# Patient Record
Sex: Male | Born: 1965
Health system: Southern US, Community
[De-identification: ages and names within clinical notes are randomized; demographics above are authoritative.]

## PROBLEM LIST (undated history)

## (undated) DIAGNOSIS — Q631 Lobulated, fused and horseshoe kidney: Secondary | ICD-10-CM

## (undated) DIAGNOSIS — M1990 Unspecified osteoarthritis, unspecified site: Secondary | ICD-10-CM

## (undated) DIAGNOSIS — IMO0001 Reserved for inherently not codable concepts without codable children: Secondary | ICD-10-CM

## (undated) DIAGNOSIS — N62 Hypertrophy of breast: Secondary | ICD-10-CM

## (undated) DIAGNOSIS — E669 Obesity, unspecified: Secondary | ICD-10-CM

## (undated) DIAGNOSIS — J45909 Unspecified asthma, uncomplicated: Secondary | ICD-10-CM

## (undated) DIAGNOSIS — I10 Essential (primary) hypertension: Secondary | ICD-10-CM

## (undated) DIAGNOSIS — G4730 Sleep apnea, unspecified: Secondary | ICD-10-CM

## (undated) DIAGNOSIS — Z87891 Personal history of nicotine dependence: Secondary | ICD-10-CM

## (undated) DIAGNOSIS — R7302 Impaired glucose tolerance (oral): Secondary | ICD-10-CM

## (undated) DIAGNOSIS — R948 Abnormal results of function studies of other organs and systems: Secondary | ICD-10-CM

## (undated) HISTORY — DX: Hypertrophy of breast: N62

## (undated) HISTORY — DX: Impaired glucose tolerance (oral): R73.02

## (undated) HISTORY — DX: Lobulated, fused and horseshoe kidney: Q63.1

## (undated) HISTORY — PX: COLONOSCOPY: SHX174

## (undated) HISTORY — DX: Abnormal results of function studies of other organs and systems: R94.8

## (undated) HISTORY — DX: Obesity, unspecified: E66.9

## (undated) HISTORY — PX: COLONOSCOPY W/ POLYPECTOMY: SHX1380

## (undated) HISTORY — DX: Essential (primary) hypertension: I10

## (undated) HISTORY — DX: Sleep apnea, unspecified: G47.30

## (undated) HISTORY — DX: Personal history of nicotine dependence: Z87.891

## (undated) HISTORY — PX: TONSILLECTOMY: SUR1361

---

## 1998-11-05 ENCOUNTER — Encounter: Admission: RE | Admit: 1998-11-05 | Discharge: 1999-02-03 | Payer: Self-pay | Admitting: Family Medicine

## 2001-08-17 ENCOUNTER — Encounter: Payer: Self-pay | Admitting: Family Medicine

## 2001-08-17 ENCOUNTER — Encounter: Admission: RE | Admit: 2001-08-17 | Discharge: 2001-08-17 | Payer: Self-pay | Admitting: Family Medicine

## 2004-11-19 LAB — HM MAMMOGRAPHY: HM Mammogram: NEGATIVE

## 2005-06-18 ENCOUNTER — Observation Stay (HOSPITAL_COMMUNITY): Admission: EM | Admit: 2005-06-18 | Discharge: 2005-06-19 | Payer: Self-pay | Admitting: Emergency Medicine

## 2005-06-19 ENCOUNTER — Ambulatory Visit: Payer: Self-pay | Admitting: Cardiology

## 2005-06-23 ENCOUNTER — Ambulatory Visit: Payer: Self-pay

## 2006-02-20 ENCOUNTER — Ambulatory Visit: Payer: Self-pay | Admitting: Family Medicine

## 2006-07-28 ENCOUNTER — Ambulatory Visit: Payer: Self-pay | Admitting: Family Medicine

## 2007-01-27 ENCOUNTER — Ambulatory Visit: Payer: Self-pay | Admitting: Family Medicine

## 2007-03-08 ENCOUNTER — Ambulatory Visit: Payer: Self-pay | Admitting: Gastroenterology

## 2007-05-19 ENCOUNTER — Ambulatory Visit: Payer: Self-pay | Admitting: Family Medicine

## 2008-02-01 ENCOUNTER — Ambulatory Visit: Payer: Self-pay | Admitting: Family Medicine

## 2008-02-09 ENCOUNTER — Ambulatory Visit: Payer: Self-pay | Admitting: Family Medicine

## 2008-05-26 ENCOUNTER — Emergency Department: Payer: Self-pay | Admitting: Emergency Medicine

## 2008-10-20 ENCOUNTER — Ambulatory Visit: Payer: Self-pay | Admitting: Family Medicine

## 2008-10-23 ENCOUNTER — Encounter: Admission: RE | Admit: 2008-10-23 | Discharge: 2008-10-23 | Payer: Self-pay | Admitting: Family Medicine

## 2008-10-23 ENCOUNTER — Ambulatory Visit: Payer: Self-pay | Admitting: Family Medicine

## 2008-11-23 ENCOUNTER — Ambulatory Visit: Payer: Self-pay | Admitting: Family Medicine

## 2009-01-02 ENCOUNTER — Ambulatory Visit: Payer: Self-pay | Admitting: Family Medicine

## 2009-01-17 ENCOUNTER — Ambulatory Visit: Payer: Self-pay | Admitting: Family Medicine

## 2009-06-27 ENCOUNTER — Ambulatory Visit: Payer: Self-pay | Admitting: Family Medicine

## 2009-08-08 ENCOUNTER — Ambulatory Visit: Payer: Self-pay | Admitting: Family Medicine

## 2009-09-05 ENCOUNTER — Ambulatory Visit: Payer: Self-pay | Admitting: Family Medicine

## 2009-10-04 ENCOUNTER — Ambulatory Visit: Payer: Self-pay | Admitting: Family Medicine

## 2009-10-10 ENCOUNTER — Ambulatory Visit: Payer: Self-pay | Admitting: Family Medicine

## 2009-12-06 ENCOUNTER — Ambulatory Visit: Payer: Self-pay | Admitting: Physician Assistant

## 2010-01-02 ENCOUNTER — Ambulatory Visit: Payer: Self-pay | Admitting: Family Medicine

## 2010-04-25 ENCOUNTER — Ambulatory Visit: Payer: Self-pay | Admitting: Family Medicine

## 2010-04-29 ENCOUNTER — Ambulatory Visit: Payer: Self-pay | Admitting: Family Medicine

## 2010-05-01 ENCOUNTER — Ambulatory Visit: Payer: Self-pay | Admitting: Family Medicine

## 2010-05-15 ENCOUNTER — Ambulatory Visit: Payer: Self-pay | Admitting: Family Medicine

## 2010-06-03 ENCOUNTER — Ambulatory Visit: Payer: Self-pay | Admitting: Family Medicine

## 2010-11-05 ENCOUNTER — Ambulatory Visit (INDEPENDENT_AMBULATORY_CARE_PROVIDER_SITE_OTHER): Payer: BC Managed Care – PPO | Admitting: Family Medicine

## 2010-11-05 DIAGNOSIS — H6690 Otitis media, unspecified, unspecified ear: Secondary | ICD-10-CM

## 2010-11-05 DIAGNOSIS — E669 Obesity, unspecified: Secondary | ICD-10-CM

## 2010-11-05 DIAGNOSIS — J45909 Unspecified asthma, uncomplicated: Secondary | ICD-10-CM

## 2010-11-05 DIAGNOSIS — Z79899 Other long term (current) drug therapy: Secondary | ICD-10-CM

## 2010-11-26 ENCOUNTER — Encounter (INDEPENDENT_AMBULATORY_CARE_PROVIDER_SITE_OTHER): Payer: BC Managed Care – PPO | Admitting: Family Medicine

## 2010-11-26 DIAGNOSIS — J309 Allergic rhinitis, unspecified: Secondary | ICD-10-CM

## 2010-11-26 DIAGNOSIS — I10 Essential (primary) hypertension: Secondary | ICD-10-CM

## 2010-11-26 DIAGNOSIS — Z0000 Encounter for general adult medical examination without abnormal findings: Secondary | ICD-10-CM

## 2010-11-26 DIAGNOSIS — J45909 Unspecified asthma, uncomplicated: Secondary | ICD-10-CM

## 2010-11-26 DIAGNOSIS — E669 Obesity, unspecified: Secondary | ICD-10-CM

## 2011-01-24 ENCOUNTER — Telehealth: Payer: Self-pay | Admitting: Family Medicine

## 2011-01-24 ENCOUNTER — Other Ambulatory Visit: Payer: Self-pay | Admitting: Family Medicine

## 2011-01-24 MED ORDER — VALSARTAN 160 MG PO TABS: 160.0000 mg | ORAL_TABLET | Freq: Every day | ORAL | Status: DC

## 2011-01-24 MED ORDER — CITALOPRAM HYDROBROMIDE 20 MG PO TABS: 20.0000 mg | ORAL_TABLET | Freq: Every day | ORAL | Status: DC
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
## 2011-01-24 NOTE — H&P (Signed)
NAME:  Alex Oneal, Alex Oneal NO.:  [id number]   MEDICAL RECORD NO.:  [id number]          PATIENT TYPE:  EMS   LOCATION:  MAJO                         FACILITY:  [facility]   PHYSICIAN:  Lonia Blood, M.D.       DATE OF BIRTH:  Jun 23, 1966   DATE OF ADMISSION:  06/18/2005  DATE OF DISCHARGE:                                HISTORY & PHYSICAL   PRIMARY CARE PHYSICIAN:  Dr. Sharlot Gowda.   CHIEF COMPLAINT:  Left facial numbness and chest discomfort.   HISTORY OF PRESENT ILLNESS:  Alex Oneal is a 45 year old Caucasian man with  hypertension, obesity and a 20 pack year history of tobacco abuse, who  reports that during the day today while he was driving his car he suddenly  felt numbness over the left side of his face with numbness of the left upper  extremity. He drove himself to an Urgent Care Center where he noticed that  he was really numb in the left upper extremity and in the left side of his  face. He also was getting weak. At the same time he felt that his chest was  very heavy like something was sitting on his chest. His symptoms subsided in  about 15 minutes. Currently he does not have any chest pain, does not have  any discomfort. He does not report any numbness or weakness.   CURRENT MEDICATIONS:  Hydrochlorothiazide/Triamterene, and Benicar.   ALLERGIES:  No known drug allergies.   PAST MEDICAL HISTORY:  1.  Hypertension.  2.  Obesity.  3.  Tonsillectomy.   SOCIAL HISTORY:  The patient is married and has one child. He works as an  Print production planner. He drinks a fifth of alcohol every day. He smoked a pack of  cigarettes daily for 20 days and he quit 3 months ago.   FAMILY HISTORY:  His father is alive at age 33 with hypertension and  diabetes. His mother is alive at age 58 and she has had a myocardial  infarction, coronary artery disease, hypertension. A brother who probably is  healthy.   REVIEW OF SYSTEMS:  Positive for some headache. Positive for an episode  of  diarrhea this morning. Negative for coughing, negative for frank chest pain,  negative for abdominal pain, negative for nausea and vomiting. All other  systems are negative.   PHYSICAL EXAMINATION:  VITAL SIGNS:  Temperature is 98.3 on admission. Pulse  96, respirations 24, blood pressure 137/71.  GENERAL:  He is mildly obese, in no acute distress, sitting on a stretcher.  Slightly annoyed by the fact that he has spent his whole day in the  emergency room.  HEENT:  Head is normocephalic, atraumatic. There is a slight facial  asymmetry but per the wife this has always been like that. His pupils are  equal and round, react to light and accommodation. Extraocular movements are  intact.  NECK:  Supple without jugular venous distension.  CHEST:  Clear to auscultation bilaterally without wheezes, rhonchi,  crackles.  HEART:  Regular rate and rhythm with no murmurs, rubs, or gallops.  ABDOMEN:  Soft, obese, nontender, bowel sounds are present.  EXTREMITIES:  No edema.  SKIN:  Warm and dry.  NEUROLOGIC EXAM:  Cranial nerves II-XII grossly intact, 5 out of 5 strength  in all four extremities. The patient is able to walk without any difficulty.  PSYCHIATRIC:  He has good insight, orientation, memory and affect.  LYMPH NODES:  No palpable lymphadenopathy.   At time of admission an EKG showed sinus tachycardia with occasional  premature ventricular complexes. MRI/MRA of the brain are within normal  limits, there are absolutely no changes. Hemoglobin is 15.3, white blood  cell count 11,500. First set of cardiac markers are within normal limits.  Electrolytes are sodium 139, potassium 3.9, chloride 106, BUN 120,  creatinine 1.1.   ASSESSMENT/PLAN:  1.  Transient ischemic attack. Alex Oneal symptoms have completely resolved      at this point. The plan is to admit him for 23-hour observation, start      him on aspirin, obtain MRI/MRA which was already done in the emergency      room and is  within normal limits.  We will also check a fasting lipid panel, start him on cholesterol  medication if needed. Continue antihypertensive treatment.   1.  Chest pressure. This could be an anginal equivalent. The plan is to      observe the patient on telemetry. Cycle cardiac enzymes. Repeat an EKG      in the morning to make sure there were no changes over night, and start      patient on a beta blocker. Deep venous thrombosis prophylaxis will be      done with Lovenox. GI prophylaxis with Protonix.      Lonia Blood, M.D.  Electronically Signed     SL/MEDQ  D:  06/18/2005  T:  06/18/2005  Job:  [id number]   cc:   Sharlot Gowda, M.D.  Fax: [phone]

## 2011-01-24 NOTE — Consult Note (Signed)
NAME:  Alex Oneal, Alex Oneal NO.:  [id number]   MEDICAL RECORD NO.:  [id number]          PATIENT TYPE:  INP   LOCATION:  5526                         FACILITY:  [facility]   PHYSICIAN:  Bevelyn Buckles. Champey, M.D.DATE OF BIRTH:  [birth year]   DATE OF CONSULTATION:  06/18/2005  DATE OF DISCHARGE:                                   CONSULTATION   REFERRING PHYSICIAN:  Trudi Ida. Denton Lank, M.D.   REASON FOR CONSULTATION:  Transient ischemic attack.   HISTORY OF PRESENT ILLNESS:  Alex Oneal is a 45 year old right-handed  Caucasian male, with a past medical history of hypertension, who presented  with left facial and hand numbness that started at 1:40 p.m. today. The  patient stated that he woke up this morning with a slight headache this a.m.  with the left face, primarily cheek and hand, numbness. The patient also has  felt weak all over and had heaviness in his chest. He also mentions he had a  funny taste of copper in his mouth as well. He felt slightly off balance at  this time as well as when he presented. His symptoms quickly resolved over  20 to 30 minutes and he is currently back to baseline. He denies any other  symptoms or vision changes. The patient denied swallowing problems, chewing  problems, vertigo, falls, or loss of consciousness.   PAST MEDICAL HISTORY:  Positive for hypertension.   CURRENT MEDICATIONS:  Hydrochlorothiazide.   ALLERGIES:  The patient has drug allergy to AMOXICILLIN.   FAMILY HISTORY:  Positive for heart disease, hypertension, and diabetes.   SOCIAL HISTORY:  The patient currently lives with wife and child. Quit  smoking in June of this year. He used to smoke one pack a day for the past  20 years. The patient drinks six beers a day as well. The patient currently  works as an Print production planner.   REVIEW OF SYSTEMS:  Positive for left numbness which has resolved. Chest  pressure which has resolved. Anxiety, depression. Review of systems is  otherwise  negative as per history of present illness and greater than eight  other organ systems.   PHYSICAL EXAMINATION:  VITAL SIGNS:  Temperature is 98.3, pulse is 96,  respirations 24, blood pressure 127/71.  HEENT:  Normocephalic and atraumatic. Extraocular movements intact. Pupils  are equal, round, and reactive to light.  NECK:  Supple. No carotid bruits are heard.  HEART:  Regular.  LUNGS:  Clear.  ABDOMEN:  Soft and nontender.  EXTREMITIES:  No edema with good pulses.  NEUROLOGICAL:  The patient is alert and oriented x3. Language is fluent.  Memory and knowledge within normal limits. Cranial nerves II through XII are  grossly intact. Motor examination shows 5/5 strength and normal tone in all  four extremities. No drift is noted. Sensory examination is within normal  limits with light touch throughout. Reflexes are 1 to 2+ throughout and  symmetric. Cerebellar function is within normal limits. Finger test to rapid  alternating movements and gait is unremarkable.   The patient did have a MRI in the emergency room  that showed no acute  intracranial findings with some chronic sinusitis.   LABORATORY DATA:  Sodium was 139, potassium was 3.9, chloride is 106,  bicarbonate is 25.4, BUN 10, glucose is 101. WBC is 11.5, hemoglobin is  15.3, hematocrit 44.8. Platelets are 321,000. PT is 12.4, INR is 0.9, PTT is  32.   IMPRESSION:  This is a 45 year old with transient left-sided numbness in his  face and hand. This could possibly be a transient ischemic attack with a  negative MRI. With his chest pain pressure that he was having, would still  rule out a myocardial infarction, check serial cardiac enzymes. With a  normal MRI, we can also get carotid Doppler's and 2-D echocardiogram to  complete the workup. I agree with starting the patient on daily aspirin as  he does have risk factors and a family history for vascular disease. We can  check his lipid panel and homocystine level as well to  rule out possible  other risk factors. We will follow the patient while in the hospital.           ______________________________  Bevelyn Buckles. Nash Shearer, M.D.     DRC/MEDQ  D:  06/18/2005  T:  06/19/2005  Job:  [id number]

## 2011-01-27 ENCOUNTER — Other Ambulatory Visit: Payer: Self-pay | Admitting: Family Medicine

## 2011-01-27 NOTE — Telephone Encounter (Signed)
Called pt pt said he is not with [facility] anymore but cant remember who he is with will find out and call me back

## 2011-02-10 ENCOUNTER — Telehealth: Payer: Self-pay | Admitting: Family Medicine

## 2011-02-10 MED ORDER — ALBUTEROL SULFATE HFA 108 (90 BASE) MCG/ACT IN AERS: 2.0000 | INHALATION_SPRAY | Freq: Four times a day (QID) | RESPIRATORY_TRACT | Status: DC | PRN

## 2011-02-10 NOTE — Telephone Encounter (Signed)
PT'S CELEXA WAS REFILLED IN ERROR, PT NO LONGER ON THIS.  ALSO NEEDS REFILL PROAIR TO [facility] AS ABOVE

## 2011-02-11 ENCOUNTER — Telehealth: Payer: Self-pay | Admitting: Family Medicine

## 2011-02-11 NOTE — Telephone Encounter (Signed)
Pt called stating [facility] pharmacy was too expensive, he cancelled order & req'd Rx Proair be called to [facility][phone], I called in Rx #1/0. LM

## 2011-02-24 NOTE — Telephone Encounter (Signed)
done

## 2011-02-26 ENCOUNTER — Ambulatory Visit (INDEPENDENT_AMBULATORY_CARE_PROVIDER_SITE_OTHER): Payer: BC Managed Care – PPO | Admitting: Family Medicine

## 2011-02-26 ENCOUNTER — Encounter: Payer: Self-pay | Admitting: Family Medicine

## 2011-02-26 VITALS — BP 124/86 | HR 80 | Temp 98.1°F | Ht 70.75 in | Wt 322.0 lb

## 2011-02-26 DIAGNOSIS — J029 Acute pharyngitis, unspecified: Secondary | ICD-10-CM

## 2011-02-26 NOTE — Progress Notes (Signed)
  Subjective:    Patient ID: Alex Oneal, male    DOB: [birth year], 45 y.o.   MRN: [id number]  HPI Patient presents with complaint of sore throat for 3 days, gradually worsening.  Slight dry cough (chronic x 20 years), unchanged.  Denies sneezing, congestion, sinus pain.  Some ear discomfort with swallowing.  Son has been sick with sniffling and coughing.  Denies fevers  Past Medical History  Diagnosis Date  . Hypertension   No Known Allergies Current Outpatient Prescriptions on File Prior to Visit  Medication Sig Dispense Refill  . albuterol (PROAIR HFA) 108 (90 BASE) MCG/ACT inhaler Inhale 2 puffs into the lungs every 6 (six) hours as needed for wheezing.  1 Inhaler  0  . valsartan (DIOVAN) 160 MG tablet Take 1 tablet (160 mg total) by mouth daily.  90 tablet  4    Review of Systems Denies fevers, nausea, vomiting, diarrhea.  Denies skin rash (other than mild sunburn)    Objective:   Physical Exam BP 124/86  Pulse 80  Temp(Src) 98.1 F (36.7 C) (Oral)  Ht 5' 10.75" (1.797 m)  Wt 322 lb (146.058 kg)  BMI 45.23 kg/m2 Well developed obese man in no distress.  Occasional cough or throat clearing. Not congested sounding HEENT:  PERRL, EOMI, conjunctiva are clear.  TM's and EAC's are normal, although light reflex on R is a little more diffuse than on L.  OP--erythema of bilateral anterior tonsillar pillars.  Tonsils are absent.  Mucus membranes are moist Neck:  Mild tenderness of shotty anterior cervical lymphadenopathy Heart:  Regular rate and rhythm without murmurs Lungs:  Clear bilaterally Skin:  No rash       Assessment & Plan:   1. Pharyngitis  POCT rapid strep A   Strep negative; viral.  Supportive measures reviewed

## 2011-04-07 ENCOUNTER — Encounter: Payer: Self-pay | Admitting: Family Medicine

## 2011-04-07 ENCOUNTER — Ambulatory Visit (INDEPENDENT_AMBULATORY_CARE_PROVIDER_SITE_OTHER): Payer: BC Managed Care – PPO | Admitting: Family Medicine

## 2011-04-07 ENCOUNTER — Telehealth: Payer: Self-pay | Admitting: *Deleted

## 2011-04-07 VITALS — BP 122/90 | HR 88 | Ht 71.0 in | Wt 322.0 lb

## 2011-04-07 DIAGNOSIS — H6691 Otitis media, unspecified, right ear: Secondary | ICD-10-CM

## 2011-04-07 DIAGNOSIS — H6690 Otitis media, unspecified, unspecified ear: Secondary | ICD-10-CM

## 2011-04-07 MED ORDER — AMOXICILLIN 875 MG PO TABS: 875.0000 mg | ORAL_TABLET | Freq: Two times a day (BID) | ORAL | Status: DC

## 2011-04-07 NOTE — Telephone Encounter (Signed)
Explained to him that we need to see him to verify the diagnosis and make sure that we are treating him with the appropriate medicine. Amoxicillin might not be the correct drug

## 2011-04-07 NOTE — Telephone Encounter (Signed)
Called pt informed him he needed an appt to come in pt got upset but got appt

## 2011-04-07 NOTE — Patient Instructions (Addendum)
Take all the medication and if  not totally back to normal call me

## 2011-04-07 NOTE — Progress Notes (Signed)
  Subjective:    Patient ID: Alex Oneal, male    DOB: [birth year], 45 y.o.   MRN: [id number]  HPI Approximately one week ago he noticed a wet sensation in his right ear. No sore throat, congestion but slightly productive cough.   Review of Systems     Objective:   Physical Exam alert and in no distress. Tympanic membrane on the right is slightly red and bulging, left is normal' canals are normal. Throat is clear. Tonsils are normal. Neck is supple without adenopathy or thyromegaly. Cardiac exam shows a regular sinus rhythm without murmurs or gallops. Lungs are clear to auscultation.        Assessment & Plan:  ROM Amoxil. Call if not entirely better.

## 2011-06-17 DIAGNOSIS — Z0279 Encounter for issue of other medical certificate: Secondary | ICD-10-CM

## 2011-06-23 ENCOUNTER — Encounter: Payer: Self-pay | Admitting: Family Medicine

## 2011-06-25 ENCOUNTER — Ambulatory Visit (INDEPENDENT_AMBULATORY_CARE_PROVIDER_SITE_OTHER): Payer: BC Managed Care – PPO | Admitting: Family Medicine

## 2011-06-25 ENCOUNTER — Encounter: Payer: Self-pay | Admitting: Family Medicine

## 2011-06-25 VITALS — BP 120/84 | HR 78 | Wt 333.0 lb

## 2011-06-25 DIAGNOSIS — H6593 Unspecified nonsuppurative otitis media, bilateral: Secondary | ICD-10-CM

## 2011-06-25 DIAGNOSIS — H6590 Unspecified nonsuppurative otitis media, unspecified ear: Secondary | ICD-10-CM

## 2011-06-25 DIAGNOSIS — L309 Dermatitis, unspecified: Secondary | ICD-10-CM

## 2011-06-25 DIAGNOSIS — L259 Unspecified contact dermatitis, unspecified cause: Secondary | ICD-10-CM

## 2011-06-25 NOTE — Patient Instructions (Addendum)
Use a decongestant that you can use with high blood pressure. Check with your pharmacist on that. Cortisone cream twice per day sparingly for the lesion on your leg. If continued difficulty, call me.

## 2011-06-25 NOTE — Progress Notes (Signed)
  Subjective:    Patient ID: Alex Oneal, male    DOB: 1966/04/10, 45 y.o.   MRN: [id number]  HPI He is here for evaluation of a lesion present on his left leg. He has tried brace over-the-counter medicines without success. It does itch slightly. He has no lesions anywhere else. Also recently he has noted a popping sensation and congestion mainly in his left ear. No fever, chills, sore throat or coughing.   Review of Systems     Objective:   Physical Exam alert and in no distress. Tympanic membranes show fluid behind them, canals are normal. Throat is clear. Tonsils are normal. Neck is supple without adenopathy or thyromegaly. Cardiac exam shows a regular sinus rhythm without murmurs or gallops. Lungs are clear to auscultation. Sample of the left medial ankle area does have a 2 x 3 cm erythematous slightly dry area with relatively vague borders        Assessment & Plan:   1. Dermatitis   2. Bilateral serous otitis media    commend over-the-counter cortisone cream regularly for the next several weeks and call if no improvement. Also recommend decongestant but check with pharmacist concerning proper one with someone with hypertension

## 2011-09-15 ENCOUNTER — Ambulatory Visit (INDEPENDENT_AMBULATORY_CARE_PROVIDER_SITE_OTHER): Payer: BC Managed Care – PPO | Admitting: Family Medicine

## 2011-09-15 ENCOUNTER — Other Ambulatory Visit: Payer: Self-pay | Admitting: Family Medicine

## 2011-09-15 ENCOUNTER — Encounter: Payer: Self-pay | Admitting: Family Medicine

## 2011-09-15 VITALS — BP 140/80 | HR 108 | Ht 70.0 in | Wt 329.0 lb

## 2011-09-15 DIAGNOSIS — J45901 Unspecified asthma with (acute) exacerbation: Secondary | ICD-10-CM

## 2011-09-15 DIAGNOSIS — J45909 Unspecified asthma, uncomplicated: Secondary | ICD-10-CM

## 2011-09-15 LAB — COMPREHENSIVE METABOLIC PANEL
Alkaline Phosphatase: 89 U/L (ref 39–117)
BUN: 16 mg/dL (ref 6–23)
Glucose, Bld: 182 mg/dL — ABNORMAL HIGH (ref 70–99)
Sodium: 138 mEq/L (ref 135–145)
Total Bilirubin: 0.3 mg/dL (ref 0.3–1.2)

## 2011-09-15 MED ORDER — ALBUTEROL SULFATE (5 MG/ML) 0.5% IN NEBU: 2.5000 mg | INHALATION_SOLUTION | RESPIRATORY_TRACT | Status: DC

## 2011-09-15 MED ORDER — ALBUTEROL SULFATE HFA 108 (90 BASE) MCG/ACT IN AERS: 2.0000 | INHALATION_SPRAY | Freq: Four times a day (QID) | RESPIRATORY_TRACT | Status: DC | PRN

## 2011-09-15 NOTE — Patient Instructions (Signed)
I will call you after I have had a chance to look at the blood work and x-ray

## 2011-09-15 NOTE — Progress Notes (Signed)
  Subjective:    Patient ID: Alex Oneal, male    DOB: Oct 27, 1965, 46 y.o.   MRN: [id number]  HPI 4 days ago he noted the onset of tightness and wheezing no fever, chills, cough, congestion or sore throat. No chest pain, DOE, diaphoresis. He does have a history of exercise-induced asthma and has been using the albuterol regularly for the last 4   Review of Systems     Objective:   Physical Exam alert and in no distress. Tympanic membranes and canals are normal. Throat is clear. Tonsils are normal. Neck is supple without adenopathy or thyromegaly. Cardiac exam shows a regular sinus rhythm without murmurs or gallops. Lungs show diffuse wheezing. He was given 2 nebulizer treatments and improved greatly. EKG shows no acute changes        Assessment & Plan:   1. Asthma attack  PR ELECTROCARDIOGRAM, COMPLETE, DG Chest 2 View, albuterol (PROVENTIL) (5 MG/ML) 0.5% nebulizer solution 2.5 mg, CBC with Differential, Comprehensive metabolic panel   I will renew his ProAir inhaler. Also a sample of Advair 100/50 was given. Followup pending x-ray and blood work.

## 2011-09-16 ENCOUNTER — Ambulatory Visit
Admission: RE | Admit: 2011-09-16 | Discharge: 2011-09-16 | Disposition: A | Discharge status: Home / Self Care | Payer: BC Managed Care – PPO | Source: Ambulatory Visit | Attending: Family Medicine | Admitting: Family Medicine

## 2011-09-16 DIAGNOSIS — J45901 Unspecified asthma with (acute) exacerbation: Secondary | ICD-10-CM

## 2011-09-16 LAB — CBC WITH DIFFERENTIAL/PLATELET
Basophils Absolute: 0 10*3/uL (ref 0.0–0.1)
Basophils Relative: 0 % (ref 0–1)
Eosinophils Absolute: 0.4 10*3/uL (ref 0.0–0.7)
Hemoglobin: 15.2 g/dL (ref 13.0–17.0)
MCH: 30.8 pg (ref 26.0–34.0)
MCHC: 33.9 g/dL (ref 30.0–36.0)
Monocytes Absolute: 1.1 10*3/uL — ABNORMAL HIGH (ref 0.1–1.0)
Monocytes Relative: 9 % (ref 3–12)
Neutrophils Relative %: 58 % (ref 43–77)
RDW: 13.4 % (ref 11.5–15.5)

## 2011-09-16 LAB — HEMOGLOBIN A1C
Hgb A1c MFr Bld: 6.3 % — ABNORMAL HIGH (ref <5.7)
Mean Plasma Glucose: 134 mg/dL — ABNORMAL HIGH (ref <117)

## 2011-09-16 MED ORDER — ALBUTEROL SULFATE (5 MG/ML) 0.5% IN NEBU: 2.5000 mg | INHALATION_SOLUTION | RESPIRATORY_TRACT | Status: DC

## 2011-09-16 NOTE — Progress Notes (Signed)
Addended by: Ronnald Nian on: 09/16/2011 12:20 PM   Modules accepted: Orders

## 2011-09-16 NOTE — Progress Notes (Signed)
Addended by: Shaune Spittle D on: 09/16/2011 12:40 PM   Modules accepted: Orders

## 2011-09-19 ENCOUNTER — Encounter: Payer: Self-pay | Admitting: Family Medicine

## 2011-09-19 ENCOUNTER — Ambulatory Visit (INDEPENDENT_AMBULATORY_CARE_PROVIDER_SITE_OTHER): Payer: BC Managed Care – PPO | Admitting: Family Medicine

## 2011-09-19 DIAGNOSIS — R7302 Impaired glucose tolerance (oral): Secondary | ICD-10-CM | POA: Insufficient documentation

## 2011-09-19 DIAGNOSIS — E669 Obesity, unspecified: Secondary | ICD-10-CM | POA: Insufficient documentation

## 2011-09-19 DIAGNOSIS — I10 Essential (primary) hypertension: Secondary | ICD-10-CM | POA: Insufficient documentation

## 2011-09-19 DIAGNOSIS — J45909 Unspecified asthma, uncomplicated: Secondary | ICD-10-CM

## 2011-09-19 NOTE — Progress Notes (Signed)
  Subjective:    Patient ID: Alex Oneal, male    DOB: [birth year], 46 y.o.   MRN: [id number]  HPI He continues to have difficulty with wheezing especially with increased physical activity but no chest pain, diaphoresis. Review of his record indicates a normal EKG as well as chest x-ray. He has good results with Advair and occasional use of Proventil.   Review of Systems     Objective:   Physical Exam Alert and in no distress. Forced expiration does show diffuse wheezing.      Assessment & Plan:   1. Asthma  Ambulatory referral to Pulmonology   I cannot identify a true cause for why he would now be having difficulty with asthma and will therefore refer to pulmonary.

## 2011-09-19 NOTE — Patient Instructions (Addendum)
Stop the Advair and continue on Proventil until you see the lung specialist. Back here in several months we can discuss the elevated blood sugar

## 2011-09-23 ENCOUNTER — Encounter: Payer: Self-pay | Admitting: Pulmonary Disease

## 2011-09-23 ENCOUNTER — Ambulatory Visit (INDEPENDENT_AMBULATORY_CARE_PROVIDER_SITE_OTHER): Payer: BC Managed Care – PPO | Admitting: Pulmonary Disease

## 2011-09-23 VITALS — BP 120/88 | HR 97 | Temp 98.4°F | Ht 70.0 in | Wt 337.4 lb

## 2011-09-23 DIAGNOSIS — J45909 Unspecified asthma, uncomplicated: Secondary | ICD-10-CM

## 2011-09-23 DIAGNOSIS — R0989 Other specified symptoms and signs involving the circulatory and respiratory systems: Secondary | ICD-10-CM

## 2011-09-23 DIAGNOSIS — J4530 Mild persistent asthma, uncomplicated: Secondary | ICD-10-CM | POA: Insufficient documentation

## 2011-09-23 DIAGNOSIS — R0600 Dyspnea, unspecified: Secondary | ICD-10-CM

## 2011-09-23 MED ORDER — OMEPRAZOLE 40 MG PO CPDR: 40.0000 mg | DELAYED_RELEASE_CAPSULE | Freq: Every day | ORAL | Status: DC

## 2011-09-23 MED ORDER — PREDNISONE 10 MG PO TABS: ORAL_TABLET | ORAL | Status: DC

## 2011-09-23 NOTE — Assessment & Plan Note (Signed)
The patient has mild air flow obstruction by spirometry today, and based on his history I suspect this is due to asthma.  I cannot exclude a component of emphysema given his 2 pack per day smoking history, and I'm also considering the diagnosis of reactive airways disease possibly from laryngopharyngeal reflux ( he has an upper airway cough with pseudo wheezing that is suggestive of this).  At this point, I would like to start him on an aggressive bronchodilator regimen, and we'll also treat with a course of prednisone since he is having more acute symptoms.  Would also like to treat with a proton pump inhibitor in for reevaluation.

## 2011-09-23 NOTE — Patient Instructions (Signed)
Will start on dulera 100/5 2 inhalations am and pm everyday no matter what. Can use albuterol for rescue only Prednisone taper over 6 days Start omeprazole 40mg  one a day for now.  Will re-evaluate next visit followup with me in 4 weeks.

## 2011-09-23 NOTE — Progress Notes (Signed)
  Subjective:    Patient ID: Alex Oneal, male    DOB: [birth year], 46 y.o.   MRN: [id number]  HPI The patient is a 46 year old male who I've been asked to see for persistent pulmonary issues.  He has noticed for the last 3 years that every winter he notices worsening shortness of breath around cold air when he does exertional activities.  This typically will get better with as needed albuterol.  He does very well with his breathing the other months of the year.  He has noticed a significant worsening of his breathing for the last 2 weeks, along with audible wheeze.  He has been tried on Advair for approximately one week, and feels it did not help him.  He has had a chest x-ray that is unremarkable.  The patient has no history of childhood asthma.  He does have ongoing ear issues, but denies sinus pressure or drainage on a chronic basis.  He admits to intermittent reflux symptoms.  He states that he has had a cough to some degree for "30 years".  This is mostly nonproductive.  The patient also states that he has dyspnea on exertion, and at the same time does not get short of breath at the mall walking on flat ground for at least 30 minutes at a time.  He does not get winded bringing groceries in from the car, and is unsure if he will get winded walking up one flight of stairs.   Review of Systems  Constitutional: Negative for fever and unexpected weight change.  HENT: Negative for ear pain, nosebleeds, congestion, sore throat, rhinorrhea, sneezing, trouble swallowing, dental problem, postnasal drip and sinus pressure.   Eyes: Negative for redness and itching.  Respiratory: Positive for cough and shortness of breath. Negative for chest tightness and wheezing.   Cardiovascular: Positive for leg swelling. Negative for palpitations.  Gastrointestinal: Negative for nausea and vomiting.  Genitourinary: Negative for dysuria.  Musculoskeletal: Negative for joint swelling.  Skin: Negative for rash.    Neurological: Negative for headaches.  Hematological: Does not bruise/bleed easily.  Psychiatric/Behavioral: Negative for dysphoric mood. The patient is not nervous/anxious.        Objective:   Physical Exam Constitutional:  Obese male, no acute distress  HENT:  Nares patent without discharge, large turbinates.  Oropharynx without exudate, palate and uvula are thickened and very large  Eyes:  Perrla, eomi, no scleral icterus  Neck:  No JVD, no TMG  Cardiovascular:  Normal rate, regular rhythm, no rubs or gallops.  No murmurs        Intact distal pulses  Pulmonary :  Normal breath sounds, no stridor or respiratory distress   No rales, rhonchi, or wheezing.  He does have loud upper pseudowheezing with transmission to bases.  Abdominal:  Soft, nondistended, bowel sounds present.  No tenderness noted.   Musculoskeletal:  mild lower extremity edema noted.  Lymph Nodes:  No cervical lymphadenopathy noted  Skin:  No cyanosis noted  Neurologic:  Alert, appropriate, moves all 4 extremities without obvious deficit.         Assessment & Plan:

## 2011-10-21 ENCOUNTER — Ambulatory Visit: Payer: BC Managed Care – PPO | Admitting: Pulmonary Disease

## 2011-10-22 ENCOUNTER — Ambulatory Visit (INDEPENDENT_AMBULATORY_CARE_PROVIDER_SITE_OTHER): Payer: BC Managed Care – PPO | Admitting: Pulmonary Disease

## 2011-10-22 ENCOUNTER — Encounter: Payer: Self-pay | Admitting: Pulmonary Disease

## 2011-10-22 VITALS — BP 124/82 | HR 84 | Temp 98.4°F | Ht 70.0 in | Wt 346.0 lb

## 2011-10-22 DIAGNOSIS — J45909 Unspecified asthma, uncomplicated: Secondary | ICD-10-CM

## 2011-10-22 MED ORDER — OMEPRAZOLE 40 MG PO CPDR: 40.0000 mg | DELAYED_RELEASE_CAPSULE | Freq: Every day | ORAL | Status: DC

## 2011-10-22 NOTE — Assessment & Plan Note (Signed)
The patient has been on dulera since the last visit, but is unsure if this has made any difference to his breathing.  It really has been a short period of time, and I would give him longer on this medication to reduce airway inflammation.  His cough resolved since the last visit, and I suspect the proton pump inhibitor is responsible.  However, the dulera may also have helped.  The description of his acute episodes suggest probable bronchospasm, however I would also keep in mind cardiac disease.  I've asked him to stay on dulera for the next 4 weeks and to give me feedback.  If he continues to have these episodes despite staying on the medication for a prolonged period of time, I would consider doing a noninvasive stress test.

## 2011-10-22 NOTE — Patient Instructions (Signed)
Stay on omeprazole daily, and we will send in a script for 3 mos at a time. Stay on dulera for now as you are doing.  Please call in 4 weeks with how things are going. If you have an episode of shortness of breath with chest tightness and doesn't resolve with you rescue inhaler in a reasonable time, call EMS Schedule a followup with me in 6mos if doing well.  Will make a decision about your inhaler after I speak with you in 4 weeks.

## 2011-10-22 NOTE — Progress Notes (Signed)
  Subjective:    Patient ID: Alex Oneal, male    DOB: 07-Nov-1965, 46 y.o.   MRN: [id number]  HPI The patient comes in today for followup of his cough and possible asthma.  At the last visit, his cough was felt to be more upper airway in origin, and he was started on a trial of proton pump inhibitor.  He is seeing total resolution of his cough since being on this.  However, he also was started on dulera for airflow obstruction, but despite this has had 2-3 episodes of worsening shortness of breath that required a rescue inhaler.  Each episode was very heavy exertional activity, and resulted in severe shortness of breath with chest tightness.  One episode had the symptom of diaphoresis as well.  He used his rescue inhaler multiple times for each of these episodes rather than giving time to go by in between uses.  Each episode resolved within 10-30 minutes.  Patient had a cardiac evaluation 4 years ago, but none since.  He is unclear if the dulera has helped him on a day-to-day basis.   Review of Systems  Constitutional: Negative for fever and unexpected weight change.  HENT: Positive for congestion. Negative for ear pain, nosebleeds, sore throat, rhinorrhea, sneezing, trouble swallowing, dental problem, postnasal drip and sinus pressure.   Eyes: Negative for redness and itching.  Respiratory: Positive for chest tightness, shortness of breath and wheezing. Negative for cough.   Cardiovascular: Negative for palpitations and leg swelling.  Gastrointestinal: Negative for nausea and vomiting.  Genitourinary: Negative for dysuria.  Musculoskeletal: Negative for joint swelling.  Skin: Negative for rash.  Neurological: Negative for headaches.  Hematological: Does not bruise/bleed easily.  Psychiatric/Behavioral: Negative for dysphoric mood. The patient is not nervous/anxious.        Objective:   Physical Exam Obese male in no acute distress Nose without purulence or discharge noted Chest with  clear breath sounds throughout, no wheezing Cardiac exam with regular rate and rhythm Lower extremities without significant edema, no cyanosis Alert and oriented, moves all 4 extremities.       Assessment & Plan:

## 2011-11-10 ENCOUNTER — Ambulatory Visit (INDEPENDENT_AMBULATORY_CARE_PROVIDER_SITE_OTHER): Payer: BC Managed Care – PPO | Admitting: Family Medicine

## 2011-11-10 ENCOUNTER — Encounter: Payer: Self-pay | Admitting: Family Medicine

## 2011-11-10 VITALS — BP 132/108 | HR 80 | Temp 98.5°F | Ht 70.0 in | Wt 332.0 lb

## 2011-11-10 DIAGNOSIS — R142 Eructation: Secondary | ICD-10-CM

## 2011-11-10 DIAGNOSIS — K219 Gastro-esophageal reflux disease without esophagitis: Secondary | ICD-10-CM

## 2011-11-10 DIAGNOSIS — R141 Gas pain: Secondary | ICD-10-CM

## 2011-11-10 DIAGNOSIS — R143 Flatulence: Secondary | ICD-10-CM

## 2011-11-10 DIAGNOSIS — R197 Diarrhea, unspecified: Secondary | ICD-10-CM

## 2011-11-10 DIAGNOSIS — R1110 Vomiting, unspecified: Secondary | ICD-10-CM

## 2011-11-10 NOTE — Patient Instructions (Signed)
You were given 10 days of Dexilant samples.  Take this once daily in place of your omeprazole.  You go back to taking the omeprazole once you finish these samples.  Today just eat clear fluids (water, ginger ale, chicken broth).  Once you are feeling a little better, advance to [employer] (banana, rice, apple sauce and toast), and then gradually advance diet as tolerated.  Stay away from dairy for at least 5-7 days, and stay away from spicy foods, acidic foods (ie tomato-based or citrus fruits) and fried/greasy foods. You may use imodium as needed for diarrhea.  If nausea is worsening, you can call for an anti-emetic medication (medication to help with nausea).  Right now, I think the nausea is related somewhat to reflux, and the dexilant may help.  Diet for Diarrhea, Adult Having frequent, runny stools (diarrhea) has many causes. Diarrhea may be caused or worsened by food or drink. Diarrhea may be relieved by changing your diet. IF YOU ARE NOT TOLERATING SOLID FOODS:  Drink enough water and fluids to keep your urine clear or pale yellow.   Avoid sugary drinks and sodas as well as milk-based beverages.   Avoid beverages containing caffeine and alcohol.   You may try rehydrating beverages. You can make your own by following this recipe:    tsp table salt.    tsp baking soda.   ? tsp salt substitute (potassium chloride).   1 tbs + 1 tsp sugar.   1 qt water.  As your stools become more solid, you can start eating solid foods. Add foods one at a time. If a certain food causes your diarrhea to get worse, avoid that food and try other foods. A low fiber, low-fat, and lactose-free diet is recommended. Small, frequent meals may be better tolerated.  Starches  Allowed:  White, [place], and pita breads, plain rolls, buns, bagels. Plain muffins, matzo. Soda, saltine, or graham crackers. Pretzels, melba toast, zwieback. Cooked cereals made with water: cornmeal, farina, cream cereals. Dry cereals:  refined corn, wheat, rice. Potatoes prepared any way without skins, refined macaroni, spaghetti, noodles, refined rice.   Avoid:  Bread, rolls, or crackers made with whole wheat, multi-grains, rye, bran seeds, nuts, or coconut. Corn tortillas or taco shells. Cereals containing whole grains, multi-grains, bran, coconut, nuts, or raisins. Cooked or dry oatmeal. Coarse wheat cereals, granola. Cereals advertised as "high-fiber." Potato skins. Whole grain pasta, wild or brown rice. Popcorn. Sweet potatoes/yams. Sweet rolls, doughnuts, waffles, pancakes, sweet breads.  Vegetables  Allowed: Strained tomato and vegetable juices. Most well-cooked and canned vegetables without seeds. Fresh: Tender lettuce, cucumber without the skin, cabbage, spinach, bean sprouts.   Avoid: Fresh, cooked, or canned: Artichokes, baked beans, beet greens, broccoli, Brussels sprouts, corn, kale, legumes, peas, sweet potatoes. Cooked: Green or red cabbage, spinach. Avoid large servings of any vegetables, because vegetables shrink when cooked, and they contain more fiber per serving than fresh vegetables.  Fruit  Allowed: All fruit juices except prune juice. Cooked or canned: Apricots, applesauce, cantaloupe, cherries, fruit cocktail, grapefruit, grapes, kiwi, mandarin oranges, peaches, pears, plums, watermelon. Fresh: Apples without skin, ripe banana, grapes, cantaloupe, cherries, grapefruit, peaches, oranges, plums. Keep servings limited to  cup or 1 piece.   Avoid: Fresh: Apple with skin, apricots, mango, pears, raspberries, strawberries. Prune juice, stewed or dried prunes. Dried fruits, raisins, dates. Large servings of all fresh fruits.  Meat and Meat Substitutes  Allowed: Ground or well-cooked tender beef, ham, veal, lamb, pork, or poultry. Eggs, plain  cheese. Fish, oysters, shrimp, lobster, other seafoods. Liver, organ meats.   Avoid: Tough, fibrous meats with gristle. Peanut butter, smooth or chunky. Cheese, nuts, seeds,  legumes, dried peas, beans, lentils.  Milk  Allowed: Yogurt, lactose-free milk, kefir, drinkable yogurt, buttermilk, soy milk.   Avoid: Milk, chocolate milk, beverages made with milk, such as milk shakes.  Soups  Allowed: Bouillon, broth, or soups made from allowed foods. Any strained soup.   Avoid: Soups made from vegetables that are not allowed, cream or milk-based soups.  Desserts and Sweets  Allowed: Sugar-free gelatin, sugar-free frozen ice pops made without sugar alcohol.   Avoid: Plain cakes and cookies, pie made with allowed fruit, pudding, custard, cream pie. Gelatin, fruit, ice, sherbet, frozen ice pops. Ice cream, ice milk without nuts. Plain hard candy, honey, jelly, molasses, syrup, sugar, chocolate syrup, gumdrops, marshmallows.  Fats and Oils  Allowed: Avoid any fats and oils.   Avoid: Seeds, nuts, olives, avocados. Margarine, butter, cream, mayonnaise, salad oils, plain salad dressings made from allowed foods. Plain gravy, crisp bacon without rind.  Beverages  Allowed: Water, decaffeinated teas, oral rehydration solutions, sugar-free beverages.   Avoid: Fruit juices, caffeinated beverages (coffee, tea, soda or pop), alcohol, sports drinks, or lemon-lime soda or pop.  Condiments  Allowed: Ketchup, mustard, horseradish, vinegar, cream sauce, cheese sauce, cocoa powder. Spices in moderation: allspice, basil, bay leaves, celery powder or leaves, cinnamon, cumin powder, curry powder, ginger, mace, marjoram, onion or garlic powder, oregano, paprika, parsley flakes, ground pepper, rosemary, sage, savory, tarragon, thyme, turmeric.   Avoid: Coconut, honey.  Weight Monitoring: Weigh yourself every day. You should weigh yourself in the morning after you urinate and before you eat breakfast. Wear the same amount of clothing when you weigh yourself. Record your weight daily. Bring your recorded weights to your clinic visits. Tell your caregiver right away if you have gained 3  lb/1.4 kg or more in 1 day, 5 lb/2.3 kg in a week, or whatever amount you were told to report. SEEK IMMEDIATE MEDICAL CARE IF:   You are unable to keep fluids down.   You start to throw up (vomit) or diarrhea keeps coming back (persistent).   Abdominal pain develops, increases, or can be felt in one place (localizes).   You have an oral temperature above 102 F (38.9 C), not controlled by medicine.   Diarrhea contains blood or mucus.   You develop excessive weakness, dizziness, fainting, or extreme thirst.  MAKE SURE YOU:   Understand these instructions.   Will watch your condition.   Will get help right away if you are not doing well or get worse.  Document Released: 11/15/2003 Document Revised: 08/14/2011 Document Reviewed: 03/08/2009 [facility] Patient Information 2012 [place], [place].

## 2011-11-10 NOTE — Progress Notes (Signed)
Chief complaint: Sunday a week ago ate some "bad" spaghetti, experienced vomiting and diarrhea Monday thru Tues. Felt better Tuesday and went back to work Wednesday. Went out to eat yesterday and thinks he had overeaten, did vomit at 1 am this morning, has not again. Some diarrhea, mostly nausea and belching. Wonders if this coul d be something more than a stomach bug?  HPI: 46 yo male was sick last week with vomiting and diarrhea x 2 days, resolved, but recurrent symptoms early this morning. Yesterday for lunch had fried catfish and fried shrimp, hush puppies--had a large, fried meal.  Also had butter and sour cream on the potato. An hour later he felt bloated, like he overate, felt 'blown u'p with gas.  Felt so bad that he didn't eat anything else last night.  Got up at midnight with diarrhea x 2-3 times, followed by vomiting x 3.  Continues to have nausea and very foul smelling burps.  Hasn't taken any meds for his symptoms. Tried Gas-X last week for bloating without benefit.  Denies fevers, but feels cold. Denies hematemesis, coffee ground emesis, or blood in stool  Hasn't taken BP meds yet today  Current Outpatient Prescriptions on File Prior to Visit  Medication Sig Dispense Refill  . albuterol (PROAIR HFA) 108 (90 BASE) MCG/ACT inhaler Inhale 2 puffs into the lungs every 6 (six) hours as needed for wheezing.  1 Inhaler  0  . hydrochlorothiazide 25 MG tablet Take 25 mg by mouth daily.        . mometasone-formoterol (DULERA) 100-5 MCG/ACT AERO Inhale 2 puffs into the lungs 2 (two) times daily.      Marland Kitchen omeprazole (PRILOSEC) 40 MG capsule Take 1 capsule (40 mg total) by mouth daily.  90 capsule  0  . valsartan (DIOVAN) 160 MG tablet Take 1 tablet (160 mg total) by mouth daily.  90 tablet  4   Current Facility-Administered Medications on File Prior to Visit  Medication Dose Route Frequency Provider Last Rate Last Dose  . albuterol (PROVENTIL) (5 MG/ML) 0.5% nebulizer solution 2.5 mg  2.5 mg  Nebulization 1 day or 1 dose Carollee Herter, MD      . albuterol (PROVENTIL) (5 MG/ML) 0.5% nebulizer solution 2.5 mg  2.5 mg Nebulization 1 day or 1 dose Carollee Herter, MD       ROS:  Denies fevers, chest pain, headache, dizziness, skin rash.  See HPI  PHYSICAL EXAM: BP 132/108  Pulse 80  Temp(Src) 98.5 F (36.9 C) (Oral)  Ht 5\' 10"  (1.778 m)  Wt 332 lb (150.594 kg)  BMI 47.64 kg/m2 Obese male, belching frequently, appears nauseated, but otherwise in no distress HEENT:  PERRL, sclera anicteric.  OP normal, moist mucus membranes Heart: regular rate and rhythm without murmur Lungs: clear bilaterally Abdomen: obese, soft.  Normal bowel sounds, not hyperactive.  Tender in epigastrium, and mildly tender across lower abdomen.  No rebound tenderness or guarding Skin: no rash  ASSESSMENT/PLAN: 1. Belching   2. Vomiting and diarrhea   3. GERD (gastroesophageal reflux disease)    Stop omeprazole.  Take Dexilant once daily in its place x 10 days.  Call if ongoing nausea for anti-emetic medication. Clears today, advance to [employer], then advance to bland diet.  Avoid spicy, greasy foods.  F/u prn.  Resume omeprazole once Dexilant samples finished

## 2011-11-17 ENCOUNTER — Ambulatory Visit (INDEPENDENT_AMBULATORY_CARE_PROVIDER_SITE_OTHER): Payer: BC Managed Care – PPO | Admitting: Family Medicine

## 2011-11-17 ENCOUNTER — Encounter: Payer: Self-pay | Admitting: Family Medicine

## 2011-11-17 VITALS — BP 140/100 | HR 79 | Temp 98.3°F | Wt 319.0 lb

## 2011-11-17 DIAGNOSIS — R197 Diarrhea, unspecified: Secondary | ICD-10-CM

## 2011-11-17 DIAGNOSIS — H6691 Otitis media, unspecified, right ear: Secondary | ICD-10-CM

## 2011-11-17 DIAGNOSIS — H6690 Otitis media, unspecified, unspecified ear: Secondary | ICD-10-CM

## 2011-11-17 DIAGNOSIS — J069 Acute upper respiratory infection, unspecified: Secondary | ICD-10-CM

## 2011-11-17 MED ORDER — CIPROFLOXACIN HCL 500 MG PO TABS: 500.0000 mg | ORAL_TABLET | Freq: Two times a day (BID) | ORAL | Status: AC

## 2011-11-17 MED ORDER — CIPROFLOXACIN HCL 500 MG PO TABS: 500.0000 mg | ORAL_TABLET | Freq: Two times a day (BID) | ORAL | Status: DC

## 2011-11-17 NOTE — Progress Notes (Signed)
  Subjective:    Patient ID: Alex Oneal, male    DOB: [birth year], 46 y.o.   MRN: [id number]  HPI He is here for multiple reasons. He continues to have difficulty with diarrhea. He is seen no blood or pus in his stool. He cannot relate this to any particular foods. Had no nausea or vomiting. For the last 3 days however he has had sore throat, fatigue, nasal congestion as well as right earache.   Review of Systems     Objective:   Physical Exam alert and in no distress. Tympanic membrane on the right is slightly red and bulging, left is normal canals are normal. Throat is clear. Tonsils are normal. Neck is supple without adenopathy or thyromegaly. Cardiac exam shows a regular sinus rhythm without murmurs or gallops. Lungs are clear to auscultation. Abdominal exam shows no masses or tenderness       Assessment & Plan:   1. Right otitis media  ciprofloxacin (CIPRO) 500 MG tablet, DISCONTINUED: ciprofloxacin (CIPRO) 500 MG tablet  2. Acute URI    3. Diarrhea     I will wait to see what Cipro dose for the diarrhea before pursuing this further.

## 2011-11-17 NOTE — Patient Instructions (Signed)
Take the antibiotic and let me know what it  does for both the ear and your diarrhea. Use Afrin nasal spray at night so he can breathe through her nose. Go ahead and use Imodium for the diarrhea

## 2011-11-27 ENCOUNTER — Encounter: Payer: Self-pay | Admitting: Family Medicine

## 2011-11-27 ENCOUNTER — Ambulatory Visit (INDEPENDENT_AMBULATORY_CARE_PROVIDER_SITE_OTHER): Payer: BC Managed Care – PPO | Admitting: Family Medicine

## 2011-11-27 VITALS — BP 132/100 | HR 88 | Ht 70.0 in | Wt 318.0 lb

## 2011-11-27 DIAGNOSIS — R197 Diarrhea, unspecified: Secondary | ICD-10-CM

## 2011-11-27 NOTE — Patient Instructions (Signed)
Use Imodium for the diarrhea. You can double the dose if you need to.

## 2011-11-27 NOTE — Progress Notes (Signed)
  Subjective:    Patient ID: Alex Oneal, male    DOB: Apr 03, 1966, 46 y.o.   MRN: [id number]  HPI Since last being seen his upper respiratory symptoms have improved tremendously. Also with diarrhea initially cleared up however in the last 2 days he has had recurrence of vomiting and diarrhea. Vomiting is now diminished however he is still having loose watery stools. No fever, chills, blood or pus in his stool.   Review of Systems     Objective:   Physical Exam Alert and in no distress. Abdominal exam shows active bowel sounds without masses or tenderness.       Assessment & Plan:   1. Diarrhea    I will check for C. difficile toxin. Also recommend Imodium. Reevaluate after the C. difficile toxin report comes back.

## 2011-11-28 ENCOUNTER — Telehealth: Payer: Self-pay | Admitting: Internal Medicine

## 2011-11-28 NOTE — Telephone Encounter (Signed)
Pt states that he does not have antibotic that he has already finished it and he will proceed with the test around Monday or so and he is getting his water fixed over the weekend

## 2011-11-28 NOTE — Telephone Encounter (Signed)
Proceed with the test. The antibiotic I gave him should help with the problem that he is talking about in his water

## 2011-12-02 NOTE — Telephone Encounter (Signed)
Is doing his test tomorrow and will get his water fixed over easter

## 2011-12-25 ENCOUNTER — Telehealth: Payer: Self-pay | Admitting: Pulmonary Disease

## 2011-12-25 NOTE — Telephone Encounter (Signed)
Pt calling with an update on his breathing. He says the [facility] worked well for him during the colder weather. He has been off of this medication for 1 month and says he is doing well. He has only used his rescue inhaler once since his OV in Feb 2013. Pt would like to know if Dr. Shelle Iron wants him to go back on the [facility] or is he okay off of this medication for now. Pls advise.No Known Allergies

## 2011-12-26 NOTE — Telephone Encounter (Signed)
Called spoke with patient, advised of KC's recs.  Pt verbalized his understanding and stated that he cancelled his 6 month appt with KC.  Offered to reschedule this for him, but pt declined stating that he "cannot afford our services."  Encouraged pt to call when needed.  Will sign and forward to [facility] as FYI.

## 2011-12-26 NOTE — Telephone Encounter (Signed)
Have him stay on his acid reflux meds, and would like him to stop dulera for now If his symptoms start to come back, then we know that he really has asthma.  If they do not return, then stay off dulera and keep the upcoming 6mos apptm.

## 2011-12-30 ENCOUNTER — Other Ambulatory Visit: Payer: Self-pay

## 2011-12-30 ENCOUNTER — Telehealth: Payer: Self-pay

## 2011-12-30 ENCOUNTER — Other Ambulatory Visit: Payer: BC Managed Care – PPO

## 2011-12-30 DIAGNOSIS — R197 Diarrhea, unspecified: Secondary | ICD-10-CM

## 2011-12-30 NOTE — Telephone Encounter (Signed)
Dr.Lalone pt bought stool samples here what is it you want checked please advise

## 2011-12-31 ENCOUNTER — Other Ambulatory Visit: Payer: BC Managed Care – PPO

## 2012-02-03 ENCOUNTER — Telehealth: Payer: Self-pay | Admitting: Family Medicine

## 2012-02-03 NOTE — Telephone Encounter (Signed)
I do not see that he has an appointment for a general med check. Have him set this up and renew the medicines until he is seen

## 2012-02-04 ENCOUNTER — Other Ambulatory Visit: Payer: Self-pay

## 2012-02-04 MED ORDER — OMEPRAZOLE 40 MG PO CPDR: 40.0000 mg | DELAYED_RELEASE_CAPSULE | Freq: Every day | ORAL | Status: DC

## 2012-02-04 MED ORDER — VALSARTAN 160 MG PO TABS: 160.0000 mg | ORAL_TABLET | Freq: Every day | ORAL | Status: DC

## 2012-02-04 MED ORDER — HYDROCHLOROTHIAZIDE 25 MG PO TABS: 25.0000 mg | ORAL_TABLET | Freq: Every day | ORAL | Status: DC

## 2012-02-04 NOTE — Telephone Encounter (Signed)
Med sent in.

## 2012-02-04 NOTE — Telephone Encounter (Signed)
Sent meds in per pt request 

## 2012-02-06 ENCOUNTER — Telehealth: Payer: Self-pay | Admitting: Family Medicine

## 2012-02-06 MED ORDER — HYDROCHLOROTHIAZIDE 25 MG PO TABS: 25.0000 mg | ORAL_TABLET | Freq: Every day | ORAL | Status: DC

## 2012-02-06 MED ORDER — VALSARTAN 160 MG PO TABS: 160.0000 mg | ORAL_TABLET | Freq: Every day | ORAL | Status: DC

## 2012-02-06 MED ORDER — OMEPRAZOLE 40 MG PO CPDR: 40.0000 mg | DELAYED_RELEASE_CAPSULE | Freq: Every day | ORAL | Status: DC

## 2012-02-06 NOTE — Telephone Encounter (Signed)
Med sent to [facility]

## 2012-03-05 ENCOUNTER — Ambulatory Visit (INDEPENDENT_AMBULATORY_CARE_PROVIDER_SITE_OTHER): Payer: BC Managed Care – PPO | Admitting: Family Medicine

## 2012-03-05 ENCOUNTER — Encounter: Payer: Self-pay | Admitting: Family Medicine

## 2012-03-05 VITALS — BP 130/88 | HR 78 | Ht 70.0 in | Wt 318.0 lb

## 2012-03-05 DIAGNOSIS — R7309 Other abnormal glucose: Secondary | ICD-10-CM

## 2012-03-05 DIAGNOSIS — R7302 Impaired glucose tolerance (oral): Secondary | ICD-10-CM

## 2012-03-05 DIAGNOSIS — R0600 Dyspnea, unspecified: Secondary | ICD-10-CM

## 2012-03-05 DIAGNOSIS — I10 Essential (primary) hypertension: Secondary | ICD-10-CM

## 2012-03-05 DIAGNOSIS — Z0000 Encounter for general adult medical examination without abnormal findings: Secondary | ICD-10-CM

## 2012-03-05 DIAGNOSIS — M7710 Lateral epicondylitis, unspecified elbow: Secondary | ICD-10-CM

## 2012-03-05 LAB — LIPID PANEL
Cholesterol: 137 mg/dL (ref 0–200)
HDL: 37 mg/dL — ABNORMAL LOW (ref >39)
Total CHOL/HDL Ratio: 3.7 Ratio
Triglycerides: 56 mg/dL (ref <150)
VLDL: 11 mg/dL (ref 0–40)

## 2012-03-05 LAB — CBC WITH DIFFERENTIAL/PLATELET
Eosinophils Absolute: 0.2 10*3/uL (ref 0.0–0.7)
Eosinophils Relative: 2 % (ref 0–5)
Lymphs Abs: 2.9 10*3/uL (ref 0.7–4.0)
MCH: 30.6 pg (ref 26.0–34.0)
MCV: 88.3 fL (ref 78.0–100.0)
Monocytes Absolute: 0.7 10*3/uL (ref 0.1–1.0)
Platelets: 328 10*3/uL (ref 150–400)
RBC: 5.39 MIL/uL (ref 4.22–5.81)
RDW: 13.6 % (ref 11.5–15.5)

## 2012-03-05 LAB — COMPREHENSIVE METABOLIC PANEL
ALT: 27 U/L (ref 0–53)
BUN: 15 mg/dL (ref 6–23)
CO2: 27 mEq/L (ref 19–32)
Creat: 0.76 mg/dL (ref 0.50–1.35)
Total Bilirubin: 0.4 mg/dL (ref 0.3–1.2)

## 2012-03-05 MED ORDER — ALBUTEROL SULFATE HFA 108 (90 BASE) MCG/ACT IN AERS: 2.0000 | INHALATION_SPRAY | Freq: Four times a day (QID) | RESPIRATORY_TRACT | Status: DC | PRN

## 2012-03-05 NOTE — Patient Instructions (Addendum)
Heat for 20 minutes 3 times per day. Take 2 Aleve twice a day. Do as many things as you can with your palms open.   Is it more important than me

## 2012-03-05 NOTE — Progress Notes (Signed)
Subjective:    Patient ID: Alex Oneal, male    DOB: [birth year], 46 y.o.   MRN: [id number]  HPI He is here for complete examination. He was seen by pulmonary and placed on Dulera and Prilosec. He notes that the cough went away almost immediately when he started taking the Prilosec. He stopped taking Dulera in March. Since that time the only times he has had difficulty with his breathing is when he mows the lawn or one time he noted on a long hike he had difficulty with tightness in his chest. He also states that pollen from pine trees causes difficulty. He also complains of bilateral elbow pain started after he used a file to chop up some dirt. He experienced bilateral lateral and medial elbow pain. Also of note is he did have a contaminated well which was causing previous difficulty with diarrhea. He apparently had high levels of coliform bacteria as well as Escherichia coli however he has cleaned this up with a special filtering system.   Review of Systems  HENT: Negative.   Respiratory: Positive for cough and chest tightness.   Cardiovascular: Negative.   Gastrointestinal: Negative.   Genitourinary: Negative.   Neurological: Negative.   Hematological: Negative.        Objective:   Physical Exam BP 130/88  Pulse 78  Ht 5\' 10"  (1.778 m)  Wt 318 lb (144.244 kg)  BMI 45.63 kg/m2  SpO2 96%  General Appearance:    Alert, cooperative, no distress, appears stated age  Head:    Normocephalic, without obvious abnormality, atraumatic  Eyes:    PERRL, conjunctiva/corneas clear, EOM's intact, fundi    benign  Ears:    Normal TM's and external ear canals  Nose:   Nares normal, mucosa normal, no drainage or sinus   tenderness  Throat:   Lips, mucosa, and tongue normal; teeth and gums normal  Neck:   Supple, no lymphadenopathy;  thyroid:  no   enlargement/tenderness/nodules; no carotid   bruit or JVD  Back:    Spine nontender, no curvature, ROM normal, no CVA     tenderness  Lungs:      Clear to auscultation bilaterally without wheezes, rales or     ronchi; respirations unlabored  Chest Wall:    No tenderness or deformity   Heart:    Regular rate and rhythm, S1 and S2 normal, no murmur, rub   or gallop  Breast Exam:    No chest wall tenderness, masses or gynecomastia  Abdomen:     Soft, non-tender, nondistended, normoactive bowel sounds,    no masses, no hepatosplenomegaly  Genitalia:    Normal male external genitalia without lesions.  Testicles without masses.  No inguinal hernias.  Rectal:    Normal sphincter tone, no masses or tenderness; guaiac negative stool.  Prostate smooth, no nodules, not enlarged.  Extremities:   No clubbing, cyanosis or edema.exam of both elbows shows full motion however he does have slight tenderness palpation over both lateral epicondyles   Pulses:   2+ and symmetric all extremities  Skin:   Skin color, texture, turgor normal, no rashes or lesions  Lymph nodes:   Cervical, supraclavicular, and axillary nodes normal  Neurologic:   CNII-XII intact, normal strength, sensation and gait; reflexes 2+ and symmetric throughout          Psych:   Normal mood, affect, hygiene and grooming.           Assessment & Plan:   1. Glucose  intolerance (impaired glucose tolerance)  Amb ref to Medical Nutrition Therapy-MNT  2. Hypertension    3. Dyspnea    4. Lateral epicondylitis    5. Obesity, Class III, BMI 40-49.9 (morbid obesity)  Amb ref to Medical Nutrition Therapy-MNT  6. Routine general medical examination at a health care facility  CBC with Differential, Comprehensive metabolic panel, Lipid panel, Hemoccult - 1 Card (office)   I will continue him on his albuterol. Also instructed him on proper care for his elbows including heat, anti-inflammatory and doing as many things as possible pounds (discussed his weight reduction with him. He has made some changes and I will send him to nutritionist for further care.

## 2012-03-08 ENCOUNTER — Other Ambulatory Visit: Payer: Self-pay

## 2012-03-08 MED ORDER — VALSARTAN 160 MG PO TABS: 160.0000 mg | ORAL_TABLET | Freq: Every day | ORAL | Status: DC

## 2012-03-08 MED ORDER — OMEPRAZOLE 40 MG PO CPDR: 40.0000 mg | DELAYED_RELEASE_CAPSULE | Freq: Every day | ORAL | Status: DC

## 2012-03-08 MED ORDER — HYDROCHLOROTHIAZIDE 25 MG PO TABS: 25.0000 mg | ORAL_TABLET | Freq: Every day | ORAL | Status: DC

## 2012-03-08 NOTE — Telephone Encounter (Signed)
Med reordered for one year

## 2012-03-29 ENCOUNTER — Telehealth: Payer: Self-pay | Admitting: Internal Medicine

## 2012-03-29 NOTE — Telephone Encounter (Signed)
My note says that he is off this medication and just using albuterol. If he feels that he needs to go back on it, he needs to make an appointment

## 2012-03-29 NOTE — Telephone Encounter (Signed)
Pt states all he wanted was an appt to discuss about getting back on med not med refill some where the wires are crossed pt has appt.

## 2012-03-30 ENCOUNTER — Ambulatory Visit (INDEPENDENT_AMBULATORY_CARE_PROVIDER_SITE_OTHER): Payer: BC Managed Care – PPO | Admitting: Family Medicine

## 2012-03-30 VITALS — BP 130/82 | HR 80 | Wt 319.0 lb

## 2012-03-30 DIAGNOSIS — J45909 Unspecified asthma, uncomplicated: Secondary | ICD-10-CM

## 2012-03-30 MED ORDER — MOMETASONE FUROATE 220 MCG/INH IN AEPB: 2.0000 | INHALATION_SPRAY | Freq: Every day | RESPIRATORY_TRACT | Status: DC

## 2012-03-30 NOTE — Progress Notes (Signed)
  Subjective:    Patient ID: Alex Oneal, male    DOB: Jul 15, 1966, 46 y.o.   MRN: [id number]  HPI He is here for consult concerning difficulty with chest tightness and wheezing. Since last being seen he notes that he is now having difficulty with tightness especially at night. This is apparently occurring over 50% of the time. He would like to be placed back on his previous Dulera but does have concerns over this.  Review of Systems     Objective:   Physical Exam Alert and in no distress. Cardiac exam shows regular rhythm without murmurs or gallops. Lungs clear to auscultation.       Assessment & Plan:   1. Asthma  mometasone (ASMANEX 60 METERED DOSES) 220 MCG/INH inhaler   I will try him on a pure steroid preparation first. If he has breakthroughs, we talked about placing him back on [facility]

## 2012-04-21 ENCOUNTER — Ambulatory Visit: Payer: BC Managed Care – PPO | Admitting: Pulmonary Disease

## 2013-03-08 ENCOUNTER — Other Ambulatory Visit: Payer: Self-pay | Admitting: Family Medicine

## 2013-03-08 ENCOUNTER — Encounter: Payer: Self-pay | Admitting: Family Medicine

## 2013-03-08 ENCOUNTER — Ambulatory Visit (INDEPENDENT_AMBULATORY_CARE_PROVIDER_SITE_OTHER): Payer: BC Managed Care – PPO | Admitting: Family Medicine

## 2013-03-08 VITALS — BP 130/90 | HR 85 | Ht 71.0 in | Wt 325.0 lb

## 2013-03-08 DIAGNOSIS — R7302 Impaired glucose tolerance (oral): Secondary | ICD-10-CM

## 2013-03-08 DIAGNOSIS — J45901 Unspecified asthma with (acute) exacerbation: Secondary | ICD-10-CM

## 2013-03-08 DIAGNOSIS — Z0000 Encounter for general adult medical examination without abnormal findings: Secondary | ICD-10-CM

## 2013-03-08 DIAGNOSIS — R7309 Other abnormal glucose: Secondary | ICD-10-CM

## 2013-03-08 DIAGNOSIS — I10 Essential (primary) hypertension: Secondary | ICD-10-CM

## 2013-03-08 DIAGNOSIS — J4520 Mild intermittent asthma, uncomplicated: Secondary | ICD-10-CM

## 2013-03-08 DIAGNOSIS — B356 Tinea cruris: Secondary | ICD-10-CM

## 2013-03-08 DIAGNOSIS — E669 Obesity, unspecified: Secondary | ICD-10-CM

## 2013-03-08 DIAGNOSIS — K219 Gastro-esophageal reflux disease without esophagitis: Secondary | ICD-10-CM

## 2013-03-08 DIAGNOSIS — J45909 Unspecified asthma, uncomplicated: Secondary | ICD-10-CM

## 2013-03-08 LAB — COMPREHENSIVE METABOLIC PANEL
BUN: 16 mg/dL (ref 6–23)
CO2: 30 mEq/L (ref 19–32)
Calcium: 9.5 mg/dL (ref 8.4–10.5)
Chloride: 101 mEq/L (ref 96–112)
Creat: 0.78 mg/dL (ref 0.50–1.35)
Total Bilirubin: 0.5 mg/dL (ref 0.3–1.2)

## 2013-03-08 LAB — CBC WITH DIFFERENTIAL/PLATELET
Basophils Absolute: 0 10*3/uL (ref 0.0–0.1)
Eosinophils Absolute: 0.3 10*3/uL (ref 0.0–0.7)
Eosinophils Relative: 3 % (ref 0–5)
HCT: 46 % (ref 39.0–52.0)
Lymphocytes Relative: 23 % (ref 12–46)
Lymphs Abs: 2.8 10*3/uL (ref 0.7–4.0)
MCH: 30.4 pg (ref 26.0–34.0)
MCV: 88.5 fL (ref 78.0–100.0)
Monocytes Absolute: 1.1 10*3/uL — ABNORMAL HIGH (ref 0.1–1.0)
RDW: 13.6 % (ref 11.5–15.5)
WBC: 12.3 10*3/uL — ABNORMAL HIGH (ref 4.0–10.5)

## 2013-03-08 LAB — LIPID PANEL
Cholesterol: 136 mg/dL (ref 0–200)
HDL: 42 mg/dL (ref >39)
Total CHOL/HDL Ratio: 3.2 Ratio
Triglycerides: 54 mg/dL (ref <150)

## 2013-03-08 LAB — POCT URINALYSIS DIPSTICK
Blood, UA: NEGATIVE
Ketones, UA: NEGATIVE
Protein, UA: NEGATIVE
Spec Grav, UA: 1.01
Urobilinogen, UA: NEGATIVE

## 2013-03-08 MED ORDER — ALBUTEROL SULFATE (5 MG/ML) 0.5% IN NEBU: 2.5000 mg | INHALATION_SOLUTION | RESPIRATORY_TRACT | Status: DC

## 2013-03-08 MED ORDER — OMEPRAZOLE 40 MG PO CPDR: 40.0000 mg | DELAYED_RELEASE_CAPSULE | Freq: Every day | ORAL | Status: DC

## 2013-03-08 MED ORDER — SULCONAZOLE NITRATE 1 % EX CREA: TOPICAL_CREAM | Freq: Every day | CUTANEOUS | Status: DC

## 2013-03-08 MED ORDER — HYDROCHLOROTHIAZIDE 25 MG PO TABS: 25.0000 mg | ORAL_TABLET | Freq: Every day | ORAL | Status: DC

## 2013-03-08 MED ORDER — VALSARTAN 160 MG PO TABS: 160.0000 mg | ORAL_TABLET | Freq: Every day | ORAL | Status: DC

## 2013-03-08 NOTE — Patient Instructions (Signed)
Take the same approach that you did with smoking to your eating remember what you kid said toyou

## 2013-03-08 NOTE — Progress Notes (Signed)
Subjective:    Patient ID: Alex Oneal, male    DOB: [birth year], 47 y.o.   MRN: [id number]  HPI He is here for complete examination. He continues on his blood pressure medication and is having no difficulty. He also is using his inhaler and doing quite well with this. He has had some difficulty with a rash present in the groin area and would like a medicine that was used in the past to help with this. His reflux is under good control with Prilosec. He notes having difficulty with his weight stating that he does eat out of habit and boredom. He did quit smoking several years ago at the test of his son   Review of Systems   negative except as above Objective:   Physical Exam BP 130/90  Pulse 85  Ht 5\' 11"  (1.803 m)  Wt 325 lb (147.419 kg)  BMI 45.35 kg/m2  General Appearance:    Alert, cooperative, no distress, appears stated age  Head:    Normocephalic, without obvious abnormality, atraumatic  Eyes:    PERRL, conjunctiva/corneas clear, EOM's intact, fundi    benign  Ears:    Normal TM's and external ear canals  Nose:   Nares normal, mucosa normal, no drainage or sinus   tenderness  Throat:   Lips, mucosa, and tongue normal; teeth and gums normal  Neck:   Supple, no lymphadenopathy;  thyroid:  no   enlargement/tenderness/nodules; no carotid   bruit or JVD  Back:    Spine nontender.  Lungs:     Clear to auscultation bilaterally without wheezes, rales or     ronchi; respirations unlabored  Chest Wall:    No tenderness or deformity   Heart:    Regular rate and rhythm, S1 and S2 normal, no murmur, rub   or gallop  Breast Exam:    No chest wall tenderness, masses or gynecomastia  Abdomen:     Soft, non-tender, nondistended, normoactive bowel sounds,    no masses, no hepatosplenomegaly  Genitalia:    Normal male external genitalia without lesions.  Testicles without masses.  No inguinal hernias.   Rectal:    Normal sphincter tone, no masses or tenderness; guaiac negative stool.   Prostate smooth, no nodules, not enlarged.  Extremities:   No clubbing, cyanosis or edema  Pulses:   2+ and symmetric all extremities  Skin:   Skin color, texture, turgor normal, no rashes or lesions  Lymph nodes:   Cervical, supraclavicular, and axillary nodes normal  Neurologic:   CNII-XII intact, normal strength, sensation and gait; reflexes 2+ and symmetric throughout          Psych:   Normal mood, affect, hygiene and grooming.          Assessment & Plan:  Hypertension - Plan: POCT Urinalysis Dipstick, hydrochlorothiazide (HYDRODIURIL) 25 MG tablet, valsartan (DIOVAN) 160 MG tablet  Obesity (BMI 30-39.9)  Glucose intolerance (impaired glucose tolerance)  Asthma, intrinsic, uncomplicated  Routine general medical examination at a health care facility - Plan: CBC with Differential, Comprehensive metabolic panel, Lipid panel  Tinea cruris - Plan: sulconazole (EXELDERM) 1 % cream  Asthma - Plan: albuterol (PROVENTIL) (5 MG/ML) 0.5% nebulizer solution 2.5 mg  GERD (gastroesophageal reflux disease) - Plan: omeprazole (PRILOSEC) 40 MG capsule Discussed the fact that he was able to quit smoking and he needs but that same energy into making these lifestyle changes. I will also refer him to Ed Lurey to help with psychological component of his eating  habits.ture,

## 2013-03-09 NOTE — Progress Notes (Signed)
Quick Note:  Sent pt letter on labs ______

## 2013-03-10 ENCOUNTER — Telehealth: Payer: Self-pay | Admitting: Family Medicine

## 2013-03-10 MED ORDER — NAFTIFINE HCL 2 % EX CREA: 1.0000 "application " | TOPICAL_CREAM | Freq: Every day | CUTANEOUS | Status: DC

## 2013-03-10 NOTE — Telephone Encounter (Signed)
Changed to Naftin 2%, called into pharmacy.

## 2013-03-10 NOTE — Telephone Encounter (Signed)
Tammy is working on this 

## 2013-03-16 ENCOUNTER — Telehealth: Payer: Self-pay | Admitting: Family Medicine

## 2013-03-16 NOTE — Telephone Encounter (Signed)
He needs an appointment with me

## 2013-03-17 ENCOUNTER — Ambulatory Visit (INDEPENDENT_AMBULATORY_CARE_PROVIDER_SITE_OTHER): Payer: BC Managed Care – PPO | Admitting: Family Medicine

## 2013-03-17 ENCOUNTER — Encounter: Payer: Self-pay | Admitting: Family Medicine

## 2013-03-17 VITALS — BP 130/80

## 2013-03-17 DIAGNOSIS — J45901 Unspecified asthma with (acute) exacerbation: Secondary | ICD-10-CM

## 2013-03-17 DIAGNOSIS — J4541 Moderate persistent asthma with (acute) exacerbation: Secondary | ICD-10-CM

## 2013-03-17 DIAGNOSIS — J45909 Unspecified asthma, uncomplicated: Secondary | ICD-10-CM

## 2013-03-17 DIAGNOSIS — J4520 Mild intermittent asthma, uncomplicated: Secondary | ICD-10-CM

## 2013-03-17 MED ORDER — MOMETASONE FUROATE 220 MCG/INH IN AEPB: 2.0000 | INHALATION_SPRAY | Freq: Every day | RESPIRATORY_TRACT | Status: DC

## 2013-03-17 NOTE — Progress Notes (Signed)
  Subjective:    Patient ID: Alex Oneal, male    DOB: 12/16/1965, 47 y.o.   MRN: [id number]  HPI He is here for recheck on his breathing. He stopped using his steroid inhaler about 6 months ago and was doing well with using his rescue inhaler only when he would mow the lawn however within the 2 weeks he notes that he is having use the inhaler much more frequently especially at night   Review of Systems     Objective:   Physical Exam Alert and in no distress. Exam of the lungs does show expiratory wheezing       Assessment & Plan:  Asthma, moderate persistent, with acute exacerbation - Plan: mometasone (ASMANEX 60 METERED DOSES) 220 MCG/INH inhaler  Asthma, intrinsic, uncomplicated  a sample of Asmanex was given and a prescription. Instructions were given on use the medication and use of the rescue inhaler. He is comfortable with this and will call if he has further difficulties.

## 2013-03-17 NOTE — Telephone Encounter (Signed)
PT COMING IN AT 10:30

## 2013-03-17 NOTE — Patient Instructions (Signed)
The Asmanex should help however if you have to use your other inhaler more than twice a month at night or twice a week during the day and you need to let me know

## 2013-03-28 ENCOUNTER — Ambulatory Visit (INDEPENDENT_AMBULATORY_CARE_PROVIDER_SITE_OTHER): Payer: BC Managed Care – PPO | Admitting: Family Medicine

## 2013-03-28 VITALS — BP 140/90 | HR 87

## 2013-03-28 DIAGNOSIS — L739 Follicular disorder, unspecified: Secondary | ICD-10-CM

## 2013-03-28 DIAGNOSIS — L738 Other specified follicular disorders: Secondary | ICD-10-CM

## 2013-03-28 DIAGNOSIS — B356 Tinea cruris: Secondary | ICD-10-CM

## 2013-03-28 MED ORDER — DOXYCYCLINE HYCLATE 100 MG PO TABS: 100.0000 mg | ORAL_TABLET | Freq: Two times a day (BID) | ORAL | Status: DC

## 2013-03-28 MED ORDER — TERBINAFINE HCL 250 MG PO TABS: 250.0000 mg | ORAL_TABLET | Freq: Every day | ORAL | Status: DC

## 2013-03-28 NOTE — Patient Instructions (Signed)
Use topical Lamisil as well as the oral medication. Let me know how you're doing in about 2 weeks

## 2013-03-28 NOTE — Progress Notes (Signed)
  Subjective:    Patient ID: Alex Oneal, male    DOB: [birth year], 47 y.o.   MRN: [id number]  HPI He is here for recheck on continued difficulty with a rash in the inguinal area. He has been using a topical antifungal with no real relief. He still having difficulty with itching now also notes some follicular lesions.   Review of Systems     Objective:   Physical Exam Exam of the inguinal area does show some hyperpigmentation on the inner aspect of the thigh and slight dryness. Distal to this there is some follicular lesions and slight erythema.      Assessment & Plan:  Tinea cruris - Plan: terbinafine (LAMISIL) 250 MG tablet  Folliculitis - Plan: doxycycline (VIBRA-TABS) 100 MG tablet, Culture, routine-abscess, CANCELED: Aerobic Culture  Use topical Lamisil as well as the oral medication. Let me know how you're doing in about 2 weeks

## 2013-03-29 ENCOUNTER — Telehealth: Payer: Self-pay | Admitting: Internal Medicine

## 2013-03-29 NOTE — Telephone Encounter (Signed)
PT INFORMED

## 2013-03-29 NOTE — Telephone Encounter (Signed)
A short course of the antibiotic should not be a big concern

## 2013-03-29 NOTE — Telephone Encounter (Signed)
Pt states he read that you should not take doxycyline while drinking that it can cause liver damage and pt states he has drank 2 drinks a day for the last 15 years. And wanted to know if he should not be taking this med since he drinks

## 2013-04-01 LAB — CULTURE, ROUTINE-ABSCESS

## 2013-05-11 ENCOUNTER — Ambulatory Visit (INDEPENDENT_AMBULATORY_CARE_PROVIDER_SITE_OTHER): Payer: BC Managed Care – PPO | Admitting: Family Medicine

## 2013-05-11 ENCOUNTER — Encounter: Payer: Self-pay | Admitting: Family Medicine

## 2013-05-11 VITALS — BP 130/90 | HR 80 | Wt 325.0 lb

## 2013-05-11 DIAGNOSIS — L989 Disorder of the skin and subcutaneous tissue, unspecified: Secondary | ICD-10-CM

## 2013-05-11 DIAGNOSIS — Z23 Encounter for immunization: Secondary | ICD-10-CM

## 2013-05-11 NOTE — Progress Notes (Signed)
  Subjective:    Patient ID: Alex Oneal, male    DOB: 05/29/1966, 47 y.o.   MRN: [id number]  HPI He is here for evaluation of a lesion present on the left forearm near the elbow. He would also like a flu shot.   Review of Systems     Objective:   Physical Exam A slightly pigmented flat lesion approximately 1 cm in size with clear margins was noted on the lateral aspect of the left forearm.       Assessment & Plan:  Need for prophylactic vaccination and inoculation against influenza - Plan: Flu Vaccine QUAD 36+ mos PF IM (Fluarix)  Benign skin lesion of forearm  I reassured him that it was not dangerous but to watch it and if it changed in any way shape or form, to let me know. Shot given with benefits and risks discussed.

## 2013-05-11 NOTE — Patient Instructions (Signed)
If the forearm lesion changes in any way shape or form , come back and we will look again

## 2013-08-19 ENCOUNTER — Ambulatory Visit (INDEPENDENT_AMBULATORY_CARE_PROVIDER_SITE_OTHER): Payer: BC Managed Care – PPO | Admitting: Medical

## 2013-08-19 ENCOUNTER — Encounter: Payer: Self-pay | Admitting: Medical

## 2013-08-19 VITALS — BP 118/90 | HR 80 | Temp 97.4°F | Resp 16 | Wt 325.0 lb

## 2013-08-19 DIAGNOSIS — R0989 Other specified symptoms and signs involving the circulatory and respiratory systems: Secondary | ICD-10-CM

## 2013-08-19 DIAGNOSIS — I10 Essential (primary) hypertension: Secondary | ICD-10-CM

## 2013-08-19 DIAGNOSIS — E669 Obesity, unspecified: Secondary | ICD-10-CM

## 2013-08-19 DIAGNOSIS — R42 Dizziness and giddiness: Secondary | ICD-10-CM

## 2013-08-19 DIAGNOSIS — R0609 Other forms of dyspnea: Secondary | ICD-10-CM

## 2013-08-19 DIAGNOSIS — R0681 Apnea, not elsewhere classified: Secondary | ICD-10-CM

## 2013-08-19 DIAGNOSIS — R400 Somnolence: Secondary | ICD-10-CM

## 2013-08-19 DIAGNOSIS — G4710 Hypersomnia, unspecified: Secondary | ICD-10-CM

## 2013-08-19 DIAGNOSIS — R51 Headache: Secondary | ICD-10-CM

## 2013-08-19 DIAGNOSIS — R0683 Snoring: Secondary | ICD-10-CM

## 2013-08-19 NOTE — Progress Notes (Signed)
Subjective: Here for BP concern.  BP feels high.  Was in normal state of health until he awoke with this morning 4am with pounding headache.   He doesn't usually get headaches, and this was atypical for him.  Went back to bed, and by 6am the headache started easing off.  Felt odd the rest of the day.  Currently feels a little lightheaded, dizzy.  He drinks 3oz Scotch nightly as usual.  Had fajitas last night.  Doesn't check BPs on his own.  He typically has not had headaches, chest pain, palpitations, SOB.  He does get some ankle/lower leg edema by end of the day. He does report drinking double large coffees this morning which is not typical of him.  No other aggravating or relieving factors. no other c/o.    Past Medical History  Diagnosis Date  . Hypertension   . Glucose intolerance (impaired glucose tolerance)   . ETOH abuse   . Gynecomastia, male   . Obesity   . Former smoker     QUIT 02/2005    Review of Systems Constitutional: -fever, -chills, -sweats, -unexpected weight change,-fatigue ENT: -runny nose, -ear pain, -sore throat Cardiology:  -chest pain, -palpitations, -edema Respiratory: -cough, -shortness of breath, -wheezing Gastroenterology: -abdominal pain, -nausea, -vomiting, -diarrhea, -constipation Hematology: -bleeding or bruising problems Musculoskeletal: -arthralgias, -myalgias, -joint swelling, -back pain Ophthalmology: -vision changes Urology: -dysuria, -difficulty urinating, -hematuria, -urinary frequency, -urgency Neurology:  -weakness, -tingling, -numbness   Objective: Filed Vitals:   08/19/13 1426  BP: 122/80  Pulse: 82  Temp: 97.4 F (36.3 C)  Resp: 16    General appearance: alert, no distress, WD/WN, obese white male HEENT: normocephalic, sclerae anicteric, TMs pearly, nares patent, no discharge or erythema, pharynx normal Oral cavity: MMM, no lesions Neck: supple, no lymphadenopathy, no thyromegaly, no masses, no bruits or JVD Heart: RRR, normal S1, S2,  no murmurs Lungs: CTA bilaterally, no wheezes, rhonchi, or rales Abdomen: +bs, soft, non tender, non distended, no masses, no hepatomegaly, no splenomegaly Pulses: 2+ symmetric, upper and lower extremities, normal cap refill Ext: no edema, no cyanosis or clubbing Neuro: CN2-12 intact, nonfocal exam   Adult ECG Report  Indication: dizziness, HTN  Rate: 80bpm  Rhythm: normal sinus rhythm  QRS Axis: 50 degrees  PR Interval:  QRS Duration: 82ms  QTc:  Conduction Disturbances: none  Other Abnormalities: none  Patient's cardiac risk factors are: hypertension, male gender, obesity (BMI >= 30 kg/m2) and sedentary lifestyle.  EKG comparison: none  Narrative Interpretation: normal EKG     Assessment: Encounter Diagnoses  Name Primary?  . Dizziness and giddiness Yes  . Headache(784.0)   . Essential hypertension, benign   . Obesity, unspecified   . Snoring   . Daytime somnolence   . Witnessed apneic spells     Plan: Reviewed EKG, glucose 88, ortho statics normal.   Dizziness, headache, concern for BP - Discussed possible causes.  No red flags at the moment.  Advised he limit caffeine use, c/t BP medication as usual, avoid added salt, and call/recheck if not improving by Monday.  We discussed signs symptoms of MI, stroke, aneurysm that would prompt call to 911.    He wants to get sleep study after the first of the year.    Follow-up - call back Monday

## 2013-12-06 ENCOUNTER — Encounter: Payer: Self-pay | Admitting: Family Medicine

## 2013-12-06 ENCOUNTER — Ambulatory Visit (INDEPENDENT_AMBULATORY_CARE_PROVIDER_SITE_OTHER): Payer: BC Managed Care – PPO | Admitting: Family Medicine

## 2013-12-06 VITALS — BP 150/92 | HR 80 | Wt 339.0 lb

## 2013-12-06 DIAGNOSIS — R143 Flatulence: Secondary | ICD-10-CM

## 2013-12-06 DIAGNOSIS — R141 Gas pain: Secondary | ICD-10-CM

## 2013-12-06 DIAGNOSIS — R140 Abdominal distension (gaseous): Secondary | ICD-10-CM

## 2013-12-06 DIAGNOSIS — R142 Eructation: Secondary | ICD-10-CM

## 2013-12-06 NOTE — Progress Notes (Signed)
   Subjective:    Patient ID: Alex Oneal, male    DOB: 10/14/1965, 48 y.o.   MRN: [id number]  HPI He has a 2 month history of abdominal bloating, gas and loose stools. No abdominal cramping/pain/blood in his stool. The symptoms usually occur after meals however he cannot relate this specifically to greasy foods.   Review of Systems     Objective:   Physical Exam Alert and in no distress. Cardiac exam shows regular rhythm without murmurs or gallops. Lungs clear to auscultation. Abdominal exam shows active bowel sounds without masses or tenderness specifically negative Murphy's sign no Murphy's punch.       Assessment & Plan:  Postprandial abdominal bloating  discussed options with him concerning further workup. Recommend he keep a food diary to see if there is any pattern to his bloating be related to greasy foods or potentially milk and milk products. He will keep us informed.

## 2013-12-06 NOTE — Patient Instructions (Addendum)
Alex Oneal 373 [phone] Keep track of your food intake to see if there is a pattern especially in regard to milk and milk products or greasy foods. Then let me know.

## 2013-12-14 ENCOUNTER — Ambulatory Visit (INDEPENDENT_AMBULATORY_CARE_PROVIDER_SITE_OTHER): Payer: BC Managed Care – PPO | Admitting: Family Medicine

## 2013-12-14 ENCOUNTER — Encounter: Payer: Self-pay | Admitting: Family Medicine

## 2013-12-14 VITALS — BP 120/76 | HR 72 | Temp 98.7°F | Wt 338.0 lb

## 2013-12-14 DIAGNOSIS — J45909 Unspecified asthma, uncomplicated: Secondary | ICD-10-CM

## 2013-12-14 DIAGNOSIS — R142 Eructation: Secondary | ICD-10-CM

## 2013-12-14 DIAGNOSIS — J209 Acute bronchitis, unspecified: Secondary | ICD-10-CM

## 2013-12-14 DIAGNOSIS — J309 Allergic rhinitis, unspecified: Secondary | ICD-10-CM

## 2013-12-14 DIAGNOSIS — R140 Abdominal distension (gaseous): Secondary | ICD-10-CM

## 2013-12-14 DIAGNOSIS — J302 Other seasonal allergic rhinitis: Secondary | ICD-10-CM

## 2013-12-14 DIAGNOSIS — R143 Flatulence: Secondary | ICD-10-CM

## 2013-12-14 DIAGNOSIS — R141 Gas pain: Secondary | ICD-10-CM

## 2013-12-14 MED ORDER — AMOXICILLIN 875 MG PO TABS: 875.0000 mg | ORAL_TABLET | Freq: Two times a day (BID) | ORAL | Status: DC

## 2013-12-14 NOTE — Patient Instructions (Signed)
Take all the antibiotic and call me if not totally back to normal. Continue on your Asmanex and use the rescue inhaler for coughing and wheezing

## 2013-12-14 NOTE — Progress Notes (Signed)
   Subjective:    Patient ID: Alex Oneal, male    DOB: 08/19/1966, 48 y.o.   MRN: [id number]  HPI He has a one-day history of sore throat, rhinorrhea and productive cough with wheezing and hoarse voice. He did feel hot this morning. No earache. He does have seasonal allergies and did have some slight rhinorrhea over the weekend. He does not smoke. He continues on his Asmanex but has not used his rescue inhaler. He also notes that he has stopped eating greasy foods and his abdominal bloating has gone away.  Review of Systems     Objective:   Physical Exam alert and in no distress. Tympanic membranes and canals are normal. Throat is clear. Tonsils are normal. Neck is supple without adenopathy or thyromegaly. Cardiac exam shows a regular sinus rhythm without murmurs or gallops. Lungs are clear to auscultation.        Assessment & Plan:  Postprandial abdominal bloating  Acute bronchitis - Plan: amoxicillin (AMOXIL) 875 MG tablet  Asthma, intrinsic  Seasonal allergic rhinitis  Take all the antibiotic and call me if not totally back to normal. Continue on your Asmanex and use the rescue inhaler for coughing and wheezing

## 2014-03-21 ENCOUNTER — Encounter: Payer: Self-pay | Admitting: Family Medicine

## 2014-03-21 ENCOUNTER — Ambulatory Visit (INDEPENDENT_AMBULATORY_CARE_PROVIDER_SITE_OTHER): Payer: BC Managed Care – PPO | Admitting: Family Medicine

## 2014-03-21 VITALS — BP 140/88 | HR 78 | Ht 70.5 in | Wt 333.0 lb

## 2014-03-21 DIAGNOSIS — Z800 Family history of malignant neoplasm of digestive organs: Secondary | ICD-10-CM | POA: Insufficient documentation

## 2014-03-21 DIAGNOSIS — J309 Allergic rhinitis, unspecified: Secondary | ICD-10-CM

## 2014-03-21 DIAGNOSIS — J302 Other seasonal allergic rhinitis: Secondary | ICD-10-CM

## 2014-03-21 DIAGNOSIS — R7309 Other abnormal glucose: Secondary | ICD-10-CM

## 2014-03-21 DIAGNOSIS — Z79899 Other long term (current) drug therapy: Secondary | ICD-10-CM

## 2014-03-21 DIAGNOSIS — R7302 Impaired glucose tolerance (oral): Secondary | ICD-10-CM

## 2014-03-21 DIAGNOSIS — I959 Hypotension, unspecified: Secondary | ICD-10-CM

## 2014-03-21 DIAGNOSIS — J45909 Unspecified asthma, uncomplicated: Secondary | ICD-10-CM

## 2014-03-21 DIAGNOSIS — I10 Essential (primary) hypertension: Secondary | ICD-10-CM

## 2014-03-21 DIAGNOSIS — K219 Gastro-esophageal reflux disease without esophagitis: Secondary | ICD-10-CM | POA: Insufficient documentation

## 2014-03-21 DIAGNOSIS — J4530 Mild persistent asthma, uncomplicated: Secondary | ICD-10-CM

## 2014-03-21 DIAGNOSIS — Z0000 Encounter for general adult medical examination without abnormal findings: Secondary | ICD-10-CM

## 2014-03-21 LAB — COMPREHENSIVE METABOLIC PANEL
ALBUMIN: 3.8 g/dL (ref 3.5–5.2)
ALT: 25 U/L (ref 0–53)
AST: 17 U/L (ref 0–37)
Alkaline Phosphatase: 76 U/L (ref 39–117)
BUN: 14 mg/dL (ref 6–23)
CO2: 28 meq/L (ref 19–32)
CREATININE: 0.82 mg/dL (ref 0.50–1.35)
Calcium: 9 mg/dL (ref 8.4–10.5)
Chloride: 103 mEq/L (ref 96–112)
Glucose, Bld: 115 mg/dL — ABNORMAL HIGH (ref 70–99)
POTASSIUM: 4.4 meq/L (ref 3.5–5.3)
Sodium: 138 mEq/L (ref 135–145)
Total Bilirubin: 0.5 mg/dL (ref 0.2–1.2)
Total Protein: 6.2 g/dL (ref 6.0–8.3)

## 2014-03-21 LAB — CBC WITH DIFFERENTIAL/PLATELET
Basophils Absolute: 0 10*3/uL (ref 0.0–0.1)
Basophils Relative: 0 % (ref 0–1)
EOS PCT: 2 % (ref 0–5)
Eosinophils Absolute: 0.2 10*3/uL (ref 0.0–0.7)
HCT: 44.6 % (ref 39.0–52.0)
HEMOGLOBIN: 15.5 g/dL (ref 13.0–17.0)
LYMPHS ABS: 2.6 10*3/uL (ref 0.7–4.0)
LYMPHS PCT: 24 % (ref 12–46)
MCH: 30.9 pg (ref 26.0–34.0)
MCHC: 34.8 g/dL (ref 30.0–36.0)
MCV: 88.8 fL (ref 78.0–100.0)
MONO ABS: 0.9 10*3/uL (ref 0.1–1.0)
MONOS PCT: 8 % (ref 3–12)
NEUTROS ABS: 7.2 10*3/uL (ref 1.7–7.7)
Neutrophils Relative %: 66 % (ref 43–77)
Platelets: 324 10*3/uL (ref 150–400)
RBC: 5.02 MIL/uL (ref 4.22–5.81)
RDW: 13.3 % (ref 11.5–15.5)
WBC: 10.9 10*3/uL — AB (ref 4.0–10.5)

## 2014-03-21 LAB — POCT URINALYSIS DIPSTICK
BILIRUBIN UA: NEGATIVE
Blood, UA: NEGATIVE
Glucose, UA: NEGATIVE
Ketones, UA: NEGATIVE
LEUKOCYTES UA: NEGATIVE
Nitrite, UA: NEGATIVE
PROTEIN UA: NEGATIVE
Spec Grav, UA: 1.01
Urobilinogen, UA: NEGATIVE
pH, UA: 6

## 2014-03-21 LAB — LIPID PANEL
CHOL/HDL RATIO: 3.4 ratio
CHOLESTEROL: 135 mg/dL (ref 0–200)
HDL: 40 mg/dL (ref >39)
LDL Cholesterol: 85 mg/dL (ref 0–99)
Triglycerides: 50 mg/dL (ref <150)
VLDL: 10 mg/dL (ref 0–40)

## 2014-03-21 MED ORDER — HYDROCHLOROTHIAZIDE 25 MG PO TABS: 25.0000 mg | ORAL_TABLET | Freq: Every day | ORAL | Status: DC

## 2014-03-21 MED ORDER — ALBUTEROL SULFATE HFA 108 (90 BASE) MCG/ACT IN AERS: 2.0000 | INHALATION_SPRAY | Freq: Four times a day (QID) | RESPIRATORY_TRACT | Status: DC | PRN

## 2014-03-21 MED ORDER — MOMETASONE FUROATE 220 MCG/INH IN AEPB: 2.0000 | INHALATION_SPRAY | Freq: Every day | RESPIRATORY_TRACT | Status: DC

## 2014-03-21 MED ORDER — VALSARTAN 160 MG PO TABS: 160.0000 mg | ORAL_TABLET | Freq: Every day | ORAL | Status: DC

## 2014-03-21 NOTE — Progress Notes (Signed)
Subjective:    Patient ID: Virgina NorfolkJames Eric Oneal, male    DOB: 09/27/1965, 48 y.o.   MRN: [id number]  HPI He is here for complete examination. He has started an exercise program of going to the gym 3 days per week. He is doing 15 minutes of aerobics and weight training. He continues on his asthma medications. He uses albuterol rarely. Seasonal allergies give him very little difficulty. He continues on his blood pressure medications. He does have reflux disease and uses Prilosec regularly. He will schedule a repeat colonoscopy. There is a family history of colon cancer. His work and home life are going well. Family and social history were reviewed. He does drink 2-4 beverages per day.   Review of Systems  All other systems reviewed and are negative.      Objective:   Physical Exam BP 140/88  Pulse 78  Ht 5' 10.5" (1.791 m)  Wt 333 lb (151.048 kg)  BMI 47.09 kg/m2  General Appearance:    Alert, cooperative, no distress, appears stated age  Head:    Normocephalic, without obvious abnormality, atraumatic  Eyes:    PERRL, conjunctiva/corneas clear, EOM's intact, fundi    benign  Ears:    Normal TM's and external ear canals  Nose:   Nares normal, mucosa normal, no drainage or sinus   tenderness  Throat:   Lips, mucosa, and tongue normal; teeth and gums normal  Neck:   Supple, no lymphadenopathy;  thyroid:  no   enlargement/tenderness/nodules; no carotid   bruit or JVD   Back:    Spine nontender, no curvature, ROM normal, no CVA     tenderness  Lungs:     Clear to auscultation bilaterally without wheezes, rales or     ronchi; respirations unlabored  Chest Wall:    No tenderness or deformity   Heart:    Regular rate and rhythm, S1 and S2 normal, no murmur, rub   or gallop  Breast Exam:    No chest wall tenderness, masses or gynecomastia  Abdomen:     Soft, non-tender, nondistended, normoactive bowel sounds,    no masses, no hepatosplenomegaly        Extremities:   No clubbing, cyanosis or  edema  Pulses:   2+ and symmetric all extremities  Skin:   Skin color, texture, turgor normal, no rashes or lesions  Lymph nodes:   Cervical, supraclavicular, and axillary nodes normal  Neurologic:   CNII-XII intact, normal strength, sensation and gait; reflexes 2+ and symmetric throughout          Psych:   Normal mood, affect, hygiene and grooming.          Assessment & Plan:  Routine general medical examination at a health care facility - Plan: CBC with Differential, Comprehensive metabolic panel, Lipid panel  Morbid obesity  Seasonal allergic rhinitis  Essential hypertension - Plan: valsartan (DIOVAN) 160 MG tablet, hydrochlorothiazide (HYDRODIURIL) 25 MG tablet  Glucose intolerance (impaired glucose tolerance)  Asthma, mild persistent, uncomplicated - Plan: albuterol (PROVENTIL HFA;VENTOLIN HFA) 108 (90 BASE) MCG/ACT inhaler, mometasone (ASMANEX 60 METERED DOSES) 220 MCG/INH inhaler  Family history of colon cancer in mother  Encounter for long-term (current) use of other medications  Gastroesophageal reflux disease without esophagitis recommend reducing the Prilosec to on an as-needed basis to control his symptoms. Discussed the need for him to cut back on alcohol and ice tee consumption by about one half. Also encouraged him to become more physically active increasing his activity  to 150 minutes per week

## 2014-03-21 NOTE — Addendum Note (Signed)
Addended by: Lavell IslamHOTON, Pavneet Markwood M on: 03/21/2014 11:14 AM   Modules accepted: Orders

## 2014-03-21 NOTE — Patient Instructions (Addendum)
Take the Prilosec in the minimum effective dosing to control your cough and reflux symptoms 150 minutes a week of something physical One or 2 beers per day and cut the iced tea consumption in half

## 2014-04-03 ENCOUNTER — Telehealth: Payer: Self-pay

## 2014-04-03 NOTE — Telephone Encounter (Signed)
PT HAD ? ON LABS WBC AND GLUC. PT VERBALIZED UNDERSTANDING

## 2014-05-25 LAB — HM COLONOSCOPY

## 2014-05-30 ENCOUNTER — Encounter: Payer: Self-pay | Admitting: Family Medicine

## 2014-05-31 ENCOUNTER — Encounter: Payer: Self-pay | Admitting: Internal Medicine

## 2014-07-18 ENCOUNTER — Encounter: Payer: Self-pay | Admitting: Family Medicine

## 2014-07-18 ENCOUNTER — Ambulatory Visit (INDEPENDENT_AMBULATORY_CARE_PROVIDER_SITE_OTHER): Payer: BC Managed Care – PPO | Admitting: Family Medicine

## 2014-07-18 VITALS — BP 140/84 | HR 77 | Wt 328.0 lb

## 2014-07-18 DIAGNOSIS — K219 Gastro-esophageal reflux disease without esophagitis: Secondary | ICD-10-CM

## 2014-07-18 DIAGNOSIS — R197 Diarrhea, unspecified: Secondary | ICD-10-CM

## 2014-07-18 DIAGNOSIS — R140 Abdominal distension (gaseous): Secondary | ICD-10-CM

## 2014-07-18 NOTE — Progress Notes (Signed)
   Subjective:    Patient ID: Alex Oneal, male    DOB: 08/21/1966, 48 y.o.   MRN: [id number]  HPI He is here for evaluation of a several month history of difficulty with abdominal bloating, diarrhea and pain. He states this started after a colonoscopy. He has had no fever, chills, blood or pus in his stool. Foods have no effect on these symptoms. He has not traveled. He does have well water but recently had it checked and it was apparently clean. He does have underlying reflux disease however at this time is not using medications on regular basis. He has tried [place] and Activia.   Review of Systems     Objective:   Physical Exam Alert and in no distress. Cardiac exam shows regular rhythm without murmurs or gallops lungs are clear to auscultation. Abdominal exam shows normal bowel sounds without masses or tenderness.       Assessment & Plan:  Gastroesophageal reflux disease without esophagitis  Diarrhea - Plan: US Abdomen Limited RUQ  Abdominal bloating - Plan: US Abdomen Limited RUQ if ultrasound is negative, I might consider using a trial of antibiotics to see if this would help and if no improvement possible referral to GI.

## 2014-07-24 ENCOUNTER — Ambulatory Visit
Admission: RE | Admit: 2014-07-24 | Discharge: 2014-07-24 | Disposition: A | Discharge status: Home / Self Care | Payer: BC Managed Care – PPO | Source: Ambulatory Visit | Attending: Family Medicine | Admitting: Family Medicine

## 2014-07-24 ENCOUNTER — Telehealth: Payer: Self-pay

## 2014-07-24 ENCOUNTER — Other Ambulatory Visit: Payer: Self-pay | Admitting: Family Medicine

## 2014-07-24 DIAGNOSIS — R140 Abdominal distension (gaseous): Secondary | ICD-10-CM

## 2014-07-24 DIAGNOSIS — R197 Diarrhea, unspecified: Secondary | ICD-10-CM

## 2014-07-24 MED ORDER — METRONIDAZOLE 500 MG PO TABS: 500.0000 mg | ORAL_TABLET | Freq: Three times a day (TID) | ORAL | Status: DC

## 2014-07-24 NOTE — Telephone Encounter (Signed)
Patient said that he has had no more syptoms  since he left the office he has been eating activia  And BM a normal now do you still want him to take the antibiotic

## 2014-07-24 NOTE — Telephone Encounter (Signed)
If his BMs are normal, cancel the Flagyl

## 2014-07-25 NOTE — Telephone Encounter (Signed)
Pt informed

## 2014-08-08 ENCOUNTER — Ambulatory Visit (INDEPENDENT_AMBULATORY_CARE_PROVIDER_SITE_OTHER): Payer: BC Managed Care – PPO | Admitting: Family Medicine

## 2014-08-08 ENCOUNTER — Encounter: Payer: Self-pay | Admitting: Family Medicine

## 2014-08-08 ENCOUNTER — Other Ambulatory Visit: Payer: Self-pay

## 2014-08-08 VITALS — BP 110/70 | HR 84 | Wt 325.0 lb

## 2014-08-08 DIAGNOSIS — R140 Abdominal distension (gaseous): Secondary | ICD-10-CM

## 2014-08-08 DIAGNOSIS — R197 Diarrhea, unspecified: Secondary | ICD-10-CM

## 2014-08-08 NOTE — Progress Notes (Signed)
   Subjective:    Patient ID: Alex NorfolkJames Eric Orama, male    DOB: 09/11/1965, 48 y.o.   MRN: [id number]  HPI He is here for recheck. He did have his bowel habits returned to normal however within the last week the diarrhea has reoccurred. He does note lighter colored stool with cramping but no fever or chills, blood in stool.Marland Kitchen. He does note abdominal bloating roughly an hour after he eats but cannot relate this to any particular foods. His nausea does bother him and he has been using nonprescription medications to help with that.   Review of Systems     Objective:   Physical Exam Alert and in no distress. Abdominal exam shows normal bowel sounds without hepatosplenomegaly. Negative to sign and Murphy's punch.       Assessment & Plan:  Diarrhea - Plan: NM HEPATOBILIARY INCLUDING GB  Abdominal bloating - Plan: NM HEPATOBILIARY INCLUDING GB  If this test is negative, I will refer to GI for further evaluation

## 2014-08-09 ENCOUNTER — Telehealth: Payer: Self-pay

## 2014-08-09 NOTE — Telephone Encounter (Signed)
Called pt to inform him the first avail.was dec 22, at [facility] at 7;30 am 1st floor rad. Nothing to eat or drink 6hr prior and no stomach meds 8 hrs prior pt verbalized understanding

## 2014-08-22 ENCOUNTER — Telehealth: Payer: Self-pay | Admitting: Family Medicine

## 2014-08-22 ENCOUNTER — Other Ambulatory Visit: Payer: Self-pay

## 2014-08-22 DIAGNOSIS — R1011 Right upper quadrant pain: Secondary | ICD-10-CM

## 2014-08-22 NOTE — Telephone Encounter (Signed)
Pt wants to know if his Hydascan that is scheduled for 12/22 at [facility] can be rescheduled at [facility]

## 2014-08-22 NOTE — Telephone Encounter (Signed)
I have talked with pt he wants to have hida done at [facility] and he would like to have it after the beginning of year so it would go towards his deductible I have called them [phone] are open 7am to 4 pm mon-fri so i will call them in the morning to get his appointment

## 2014-08-23 NOTE — Telephone Encounter (Signed)
Patient has appointment Jan [facility]th [facility] of [facility] 9 am he is aware of appointment there number is [phone] I have faxed his ultra sound to them at [phone]

## 2014-08-29 ENCOUNTER — Encounter (HOSPITAL_COMMUNITY): Admission: RE | Admit: 2014-08-29 | Payer: BC Managed Care – PPO | Source: Ambulatory Visit

## 2014-09-12 ENCOUNTER — Ambulatory Visit: Payer: Self-pay | Admitting: Family Medicine

## 2014-09-15 ENCOUNTER — Telehealth: Payer: Self-pay | Admitting: Family Medicine

## 2014-09-15 NOTE — Telephone Encounter (Signed)
He did have a HIDA scan which was positive. [facility] for her to set up to see a Careers advisersurgeon

## 2014-09-15 NOTE — Telephone Encounter (Signed)
Please call HIDDA scan results

## 2014-09-15 NOTE — Telephone Encounter (Signed)
Called and left a detail message on pt phone about results were positive and referred [facility] to [facility] that they should be giving him a call to set that appt up

## 2014-10-02 ENCOUNTER — Telehealth: Payer: Self-pay | Admitting: Family Medicine

## 2014-10-02 NOTE — Telephone Encounter (Signed)
I'm glad to see him, but I cannot advise him to cancel the appointment with the surgeon at this point.   Please schedule for a appointment when possible.  Try to get records ahead of time.   Thanks.

## 2014-10-02 NOTE — Telephone Encounter (Signed)
Patient said Alex Oneal talked to you about seeing patient as a new patient.  Patient was told to call and you would fit him in sooner than August.  Patient is cancelling his appointment with a surgeon on Friday.  He had discussed with his PCP that he has bloating,nausea,gas,loose stool.  Patient's PCP referred him to the surgeon to discuss removing his gallbladder.  Patient said they symptoms have gotten better in the last month since he started taking probiotics. Patient said after he had a hide a scan done he didn't get the results, but he was referred to the surgeon.  He'd like to find a new PCP and a second opinion about his gallbladder. Please advise.

## 2014-10-06 ENCOUNTER — Telehealth: Payer: Self-pay | Admitting: Family Medicine

## 2014-10-06 DIAGNOSIS — J4530 Mild persistent asthma, uncomplicated: Secondary | ICD-10-CM

## 2014-10-06 DIAGNOSIS — I10 Essential (primary) hypertension: Secondary | ICD-10-CM

## 2014-10-06 MED ORDER — HYDROCHLOROTHIAZIDE 25 MG PO TABS: 25.0000 mg | ORAL_TABLET | Freq: Every day | ORAL | Status: DC

## 2014-10-06 MED ORDER — MOMETASONE FUROATE 220 MCG/INH IN AEPB: 2.0000 | INHALATION_SPRAY | Freq: Every day | RESPIRATORY_TRACT | Status: DC

## 2014-10-06 MED ORDER — ALBUTEROL SULFATE HFA 108 (90 BASE) MCG/ACT IN AERS: 2.0000 | INHALATION_SPRAY | Freq: Four times a day (QID) | RESPIRATORY_TRACT | Status: DC | PRN

## 2014-10-06 MED ORDER — VALSARTAN 160 MG PO TABS: 160.0000 mg | ORAL_TABLET | Freq: Every day | ORAL | Status: DC

## 2014-10-06 NOTE — Telephone Encounter (Signed)
[facility]

## 2014-10-06 NOTE — Telephone Encounter (Signed)
Pt called and stated that he is changing pharmacies. He needs [email or web address]. He needs valsartan filled now. The rest need to sent but not filled. [email or web address] fax number is [phone] and phone number is [phone].

## 2014-10-24 ENCOUNTER — Encounter: Payer: Self-pay | Admitting: Family Medicine

## 2014-10-24 ENCOUNTER — Ambulatory Visit (INDEPENDENT_AMBULATORY_CARE_PROVIDER_SITE_OTHER): Payer: BLUE CROSS/BLUE SHIELD | Admitting: Family Medicine

## 2014-10-24 VITALS — BP 140/86 | HR 86 | Temp 98.5°F | Ht 70.25 in | Wt 335.8 lb

## 2014-10-24 DIAGNOSIS — R948 Abnormal results of function studies of other organs and systems: Secondary | ICD-10-CM

## 2014-10-24 DIAGNOSIS — J4530 Mild persistent asthma, uncomplicated: Secondary | ICD-10-CM

## 2014-10-24 DIAGNOSIS — I10 Essential (primary) hypertension: Secondary | ICD-10-CM

## 2014-10-24 MED ORDER — MOMETASONE FUROATE 220 MCG/INH IN AEPB: 2.0000 | INHALATION_SPRAY | Freq: Every day | RESPIRATORY_TRACT | Status: DC

## 2014-10-24 MED ORDER — VALSARTAN 160 MG PO TABS: 160.0000 mg | ORAL_TABLET | Freq: Every day | ORAL | Status: DC

## 2014-10-24 MED ORDER — ALBUTEROL SULFATE HFA 108 (90 BASE) MCG/ACT IN AERS: 2.0000 | INHALATION_SPRAY | Freq: Four times a day (QID) | RESPIRATORY_TRACT | Status: DC | PRN

## 2014-10-24 MED ORDER — HYDROCHLOROTHIAZIDE 25 MG PO TABS: 25.0000 mg | ORAL_TABLET | Freq: Every day | ORAL | Status: DC

## 2014-10-24 NOTE — Progress Notes (Signed)
Pre visit review using our clinic review tool, if applicable. No additional management support is needed unless otherwise documented below in the visit note.  New patient to est care.  Prev abd bloating and central abd discomfort.  U/s unremarkable, HIDA noted for low EF and discomfort with CCK.  Cancelled gen surgery eval.  Cut out fat from diet and sx resolved.  No FCNAVD.  No complaints now.   PMH and SH reviewed  ROS: See HPI, otherwise noncontributory.  Meds, vitals, and allergies reviewed.   GEN: nad, alert and oriented, obese HEENT: mucous membranes moist NECK: supple w/o LA CV: rrr PULM: ctab, no inc wob ABD: soft, +bs EXT: trace edema SKIN: no acute rash

## 2014-10-24 NOTE — Assessment & Plan Note (Addendum)
Prev abd bloating and central abd discomfort. U/s unremarkable, HIDA noted for low EF and discomfort with CCK. He cancelled gen surgery eval. Cut out fat from diet and sx resolved. No FCNAVD. No complaints now.  D/w pt.  He wants to continue as is.  He has no stones on u/s.  Likely okay for trial as is.  Call back if sx and wants to see gen surgery.  Path/phys d/w pt.  He agrees.  >25 minutes spent in face to face time with patient, >50% spent in counselling or coordination of care.

## 2014-10-24 NOTE — Patient Instructions (Signed)
Stay away from fatty foods.  If you notice more troubles, then let me know.  Recheck at a physical in summer of 2016.  Glad to see you.

## 2014-10-25 ENCOUNTER — Telehealth: Payer: Self-pay | Admitting: Family Medicine

## 2014-10-25 NOTE — Telephone Encounter (Signed)
emmi emailed °

## 2015-03-01 ENCOUNTER — Ambulatory Visit (INDEPENDENT_AMBULATORY_CARE_PROVIDER_SITE_OTHER): Payer: BLUE CROSS/BLUE SHIELD | Admitting: Family Medicine

## 2015-03-01 ENCOUNTER — Encounter: Payer: Self-pay | Admitting: Family Medicine

## 2015-03-01 VITALS — BP 144/100 | HR 80 | Temp 99.1°F | Wt 320.0 lb

## 2015-03-01 DIAGNOSIS — M546 Pain in thoracic spine: Secondary | ICD-10-CM | POA: Diagnosis not present

## 2015-03-01 DIAGNOSIS — I10 Essential (primary) hypertension: Secondary | ICD-10-CM

## 2015-03-01 MED ORDER — HYDROCHLOROTHIAZIDE 25 MG PO TABS: 12.5000 mg | ORAL_TABLET | Freq: Every day | ORAL | Status: DC

## 2015-03-01 NOTE — Patient Instructions (Signed)
Single knee and double knee to chest stretching.  Gently twist to stretch your back.  Use a heating pad.  Likely a muscle strain.

## 2015-03-01 NOTE — Progress Notes (Signed)
Pre visit review using our clinic review tool, if applicable. No additional management support is needed unless otherwise documented below in the visit note.  He was off HCTZ, BP is up.  D/w pt.  He is going to restart at 1/2 tab and update me as needed.   R sided back pain.  Much better today.  No L sided pain.  Started about 1 month ago.  He has occ backaches at baseline.  It was getting worse in the last 2 weeks.  More trouble getting out of bed recently, more pain in the AM, until he gets "loosened up."  Some pain leaning forward.  Pain hasn't moved.  No dysuria.  No blood in urine.  No groin pain.    Meds, vitals, and allergies reviewed.   ROS: See HPI.  Otherwise, noncontributory.  nad ncat Mmm Neck supple, no LA rrr Ctab abd soft, not ttp Back w/o midline pain, slightly ttp R of midline in T tpsine area

## 2015-03-02 DIAGNOSIS — M549 Dorsalgia, unspecified: Secondary | ICD-10-CM | POA: Insufficient documentation

## 2015-03-02 NOTE — Assessment & Plan Note (Signed)
Likely muscle strain/spasm, d/w pt about heat and stretching, update me as needed.  No sign of intraabdominal process.

## 2015-03-26 ENCOUNTER — Other Ambulatory Visit: Payer: Self-pay | Admitting: Family Medicine

## 2015-03-26 DIAGNOSIS — I10 Essential (primary) hypertension: Secondary | ICD-10-CM

## 2015-04-03 ENCOUNTER — Other Ambulatory Visit (INDEPENDENT_AMBULATORY_CARE_PROVIDER_SITE_OTHER): Payer: BLUE CROSS/BLUE SHIELD

## 2015-04-03 DIAGNOSIS — I10 Essential (primary) hypertension: Secondary | ICD-10-CM | POA: Diagnosis not present

## 2015-04-03 LAB — COMPREHENSIVE METABOLIC PANEL
ALBUMIN: 3.9 g/dL (ref 3.5–5.2)
ALK PHOS: 82 U/L (ref 39–117)
ALT: 26 U/L (ref 0–53)
AST: 17 U/L (ref 0–37)
BUN: 15 mg/dL (ref 6–23)
CHLORIDE: 100 meq/L (ref 96–112)
CO2: 30 mEq/L (ref 19–32)
CREATININE: 0.85 mg/dL (ref 0.40–1.50)
Calcium: 9.5 mg/dL (ref 8.4–10.5)
GFR: 100.95 mL/min (ref >60.00)
GLUCOSE: 100 mg/dL — AB (ref 70–99)
POTASSIUM: 4.2 meq/L (ref 3.5–5.1)
Sodium: 137 mEq/L (ref 135–145)
Total Bilirubin: 0.5 mg/dL (ref 0.2–1.2)
Total Protein: 7 g/dL (ref 6.0–8.3)

## 2015-04-03 LAB — LIPID PANEL
Cholesterol: 124 mg/dL (ref 0–200)
HDL: 36.7 mg/dL — AB (ref >39.00)
LDL CALC: 77 mg/dL (ref 0–99)
NonHDL: 87.3
Total CHOL/HDL Ratio: 3
Triglycerides: 51 mg/dL (ref 0.0–149.0)
VLDL: 10.2 mg/dL (ref 0.0–40.0)

## 2015-04-10 ENCOUNTER — Encounter: Payer: Self-pay | Admitting: Family Medicine

## 2015-04-10 ENCOUNTER — Ambulatory Visit (INDEPENDENT_AMBULATORY_CARE_PROVIDER_SITE_OTHER): Payer: BLUE CROSS/BLUE SHIELD | Admitting: Family Medicine

## 2015-04-10 VITALS — BP 138/82 | HR 71 | Temp 98.6°F | Ht 70.0 in | Wt 315.5 lb

## 2015-04-10 DIAGNOSIS — Z0000 Encounter for general adult medical examination without abnormal findings: Secondary | ICD-10-CM | POA: Diagnosis not present

## 2015-04-10 DIAGNOSIS — J4530 Mild persistent asthma, uncomplicated: Secondary | ICD-10-CM

## 2015-04-10 DIAGNOSIS — I10 Essential (primary) hypertension: Secondary | ICD-10-CM

## 2015-04-10 DIAGNOSIS — Z7189 Other specified counseling: Secondary | ICD-10-CM

## 2015-04-10 MED ORDER — MOMETASONE FUROATE 220 MCG/INH IN AEPB: 2.0000 | INHALATION_SPRAY | Freq: Every day | RESPIRATORY_TRACT | Status: DC

## 2015-04-10 MED ORDER — HYDROCHLOROTHIAZIDE 25 MG PO TABS: 25.0000 mg | ORAL_TABLET | Freq: Every day | ORAL | Status: DC

## 2015-04-10 MED ORDER — VALSARTAN 160 MG PO TABS: 160.0000 mg | ORAL_TABLET | Freq: Every day | ORAL | Status: DC

## 2015-04-10 MED ORDER — ALBUTEROL SULFATE HFA 108 (90 BASE) MCG/ACT IN AERS: 2.0000 | INHALATION_SPRAY | Freq: Four times a day (QID) | RESPIRATORY_TRACT | Status: DC | PRN

## 2015-04-10 NOTE — Patient Instructions (Signed)
Don't change your meds for now.  Update me as needed.  Keep working on your weight.  Recheck in about 6 months.  No labs before the visit.  Take care.  Glad to see you.

## 2015-04-10 NOTE — Progress Notes (Signed)
Pre visit review using our clinic review tool, if applicable. No additional management support is needed unless otherwise documented below in the visit note.  CPE- See plan.  Routine anticipatory guidance given to patient.  See health maintenance. Tetanus 2011 PNA and shingles not due.   Flu shot done each fall.   Colonoscopy 2015 Prostate cancer screening and PSA options (with potential risks and benefits of testing vs not testing) were discussed along with recent recs/guidelines.  He declined testing PSA at this point. Living will d/w pt.  Wife designated if patient were incapacitated.   Diet and exercise d/w pt.  He is working on weight reduction gradually.  He cut out sugary sodas/drinks.  Exercise is limited currently, encouraged.   Frequent burping noted.  No abd pain, no other abd sx.  D/w pt about possible trial of beano or similar.   Noted some tongue sensation changes after dental work.  No other neuro sx.  D/w pt, he'll check with dental clinic.   Hypertension:    Using medication without problems or lightheadedness: yes Chest pain with exertion:no Edema:no Short of breath:no  Asthma.  No sx usually.  Doing well.  No ADE on med.  No night sx.   PMH and SH reviewed  Meds, vitals, and allergies reviewed.   ROS: See HPI.  Otherwise negative.    GEN: nad, alert and oriented HEENT: mucous membranes moist, op wnl on inspection.  NECK: supple w/o LA CV: rrr. PULM: ctab, no inc wob ABD: soft, +bs EXT: no edema SKIN: no acute rash

## 2015-04-11 DIAGNOSIS — Z7189 Other specified counseling: Secondary | ICD-10-CM | POA: Insufficient documentation

## 2015-04-11 DIAGNOSIS — Z0000 Encounter for general adult medical examination without abnormal findings: Secondary | ICD-10-CM | POA: Insufficient documentation

## 2015-04-11 DIAGNOSIS — Z0001 Encounter for general adult medical examination with abnormal findings: Secondary | ICD-10-CM | POA: Insufficient documentation

## 2015-04-11 NOTE — Assessment & Plan Note (Signed)
He is working on gradual weight loss. D/w pt.

## 2015-04-11 NOTE — Assessment & Plan Note (Signed)
Routine anticipatory guidance given to patient.  See health maintenance. Tetanus 2011 PNA and shingles not due.   Flu shot done each fall.   Colonoscopy 2015 Prostate cancer screening and PSA options (with potential risks and benefits of testing vs not testing) were discussed along with recent recs/guidelines.  He declined testing PSA at this point. Living will d/w pt.  Wife designated if patient were incapacitated.   Diet and exercise d/w pt.  He is working on weight reduction gradually.  He cut out sugary sodas/drinks.  Exercise is limited currently, encouraged.   Frequent burping noted.  No abd pain, no other abd sx.  D/w pt about possible trial of beano or similar.   Noted some tongue sensation changes after dental work.  No other neuro sx.  D/w pt, he'll check with dental clinic.

## 2015-04-11 NOTE — Assessment & Plan Note (Signed)
Controlled, continue as is. He agrees.  

## 2015-04-11 NOTE — Assessment & Plan Note (Signed)
Continue as is for now.  He agrees. No change in meds.  Still needs work on gradual weight loss, d/w pt.  Labs d/w pt.

## 2015-06-15 LAB — BASIC METABOLIC PANEL
Creatinine: 0.7 mg/dL (ref <=1.3)
Glucose: 86 mg/dL

## 2015-06-15 LAB — HEPATIC FUNCTION PANEL
ALT: 23 U/L (ref 10–40)
AST: 19 U/L (ref 14–40)

## 2015-06-15 LAB — CBC AND DIFFERENTIAL: HEMOGLOBIN: 15.1 g/dL (ref 13.5–17.5)

## 2015-06-27 ENCOUNTER — Encounter: Payer: Self-pay | Admitting: Family Medicine

## 2015-06-29 ENCOUNTER — Encounter: Payer: Self-pay | Admitting: Family Medicine

## 2015-07-12 ENCOUNTER — Encounter: Payer: Self-pay | Admitting: Family Medicine

## 2015-10-10 ENCOUNTER — Encounter: Payer: Self-pay | Admitting: Family Medicine

## 2015-10-10 ENCOUNTER — Ambulatory Visit (INDEPENDENT_AMBULATORY_CARE_PROVIDER_SITE_OTHER): Payer: BLUE CROSS/BLUE SHIELD | Admitting: Family Medicine

## 2015-10-10 VITALS — BP 126/88 | HR 76 | Temp 98.4°F | Wt 322.2 lb

## 2015-10-10 DIAGNOSIS — K469 Unspecified abdominal hernia without obstruction or gangrene: Secondary | ICD-10-CM | POA: Diagnosis not present

## 2015-10-10 DIAGNOSIS — K439 Ventral hernia without obstruction or gangrene: Secondary | ICD-10-CM

## 2015-10-10 NOTE — Patient Instructions (Signed)
Alex Oneal will call about your referral. If you have a sudden increase in pain, then go to the ER.  Limit lifting and straining.  Take care.  Glad to see you.

## 2015-10-10 NOTE — Progress Notes (Signed)
Pre visit review using our clinic review tool, if applicable. No additional management support is needed unless otherwise documented below in the visit note.  Possible hernia.  Noted yesterday.   Tender, noted a knot in the area.   He thinks it got worse on Sunday.  He caught himself as he was reaching, overextended, started to fall and caught himself.  Didn't fall but felt a strain at the time.  Less pain if sitting.  3/10 pain at rest, more with activity and palpation.   No FCNAV.  Some diarrhea recently, that isn't uncommon for patient.   Meds, vitals, and allergies reviewed.   ROS: See HPI.  Otherwise, noncontributory.  nad rrr ctab Likely hernia felt with a cough superior to umbilicus.  Still soft o/w.  Benign abd o/w. Normal BS, no rebound.  Ext w/o edema No rash

## 2015-10-11 ENCOUNTER — Ambulatory Visit: Payer: Self-pay | Admitting: Surgery

## 2015-10-11 DIAGNOSIS — K439 Ventral hernia without obstruction or gangrene: Secondary | ICD-10-CM | POA: Insufficient documentation

## 2015-10-11 NOTE — Assessment & Plan Note (Signed)
Likely, d/w pt about anatomy and routine cautions.   If a sudden increase in pain, then go to the ER.  Limit lifting and straining.  Refer to gen surgery.

## 2015-10-11 NOTE — H&P (Signed)
Marveen Reeks 10/11/2015 2:38 PM Location: [facility] Patient #: [id number] DOB: Mar 09, 1966 Undefined / Language: [place] / Race: White Male  History of Present Illness Maisie Fus A. Lupe Handley MD; 10/11/2015 3:07 PM) Patient words: hernia    Pt sent at the request of Dr Para March for ventral hernia. Noticed some pain and swelling 5 days ago just above umbilicus. The area is sore. No nausea or vomiting. Bowels moving. No redness or drainage. Bulge above the umbilicus.  The patient is a 50 year old male.   Problem List/Past Medical [name], [name]; 10/11/2015 2:45 PM) Acid Reflux / GERD Back Pain High blood pressure  Past Surgical History [name], [name]; 10/11/2015 2:48 PM) Hemorrhoidectomy Tonsillectomy Oral Surgery  Diagnostic Studies History [name], [name]; 10/11/2015 2:46 PM) Colonoscopy  Allergies [name], [name]; 10/11/2015 2:48 PM) No Known Drug Allergies 10/11/2015  Medication History [name], [name]; 10/11/2015 2:49 PM) Asmanex 60 Metered Doses (220MCG/INH Aero Pow Br Act, Inhalation) Active. Albuterol (90MCG/ACT Aerosol Soln, Inhalation) Active. HydroCHLOROthiazide (  Tablet, Oral) Active. Valsartan (  Tablet, Oral) Active.  Social History [name], [name]; 10/11/2015 2:46 PM) Current tobacco use Former smoker. Alcohol use Moderate alcohol use. Caffeine use Coffee. Illicit drug use Prefer to discuss with provider.  Family History [name], [name]; 10/11/2015 2:48 PM) Alcohol Abuse Arthritis Mother. Breast Cancer Mother. Colon Cancer Mother. Colon Polyps Mother. Heart Disease Mother. High Blood Pressure / Hypertension Father. Kidney Disease Mother. Ovarian Cancer Mother. Rectal Cancer Mother. Thyroid problems Mother.    Vitals [employer] R. Brooks CMA; 10/11/2015 2:44 PM) 10/11/2015 2:44 PM Weight: 318.38 lb Height: 70in Body Surface Area: 2.54 m Body Mass Index: 45.68  kg/m  BP: 126/84 (Sitting, Left Arm, Standard)      Physical Exam (Dinora Hemm A. Anaston Koehn MD; 10/11/2015 3:09 PM)  General Mental Status-Alert. General Appearance-Consistent with stated age. Hydration-Well hydrated. Voice-Normal.  Eye Eyeball - Bilateral-Extraocular movements intact. Sclera/Conjunctiva - Bilateral-No scleral icterus.  Chest and Lung Exam Chest and lung exam reveals -quiet, even and easy respiratory effort with no use of accessory muscles and on auscultation, normal breath sounds, no adventitious sounds and normal vocal resonance. Inspection Chest Wall - Normal. Back - normal.  Cardiovascular Cardiovascular examination reveals -normal heart sounds, regular rate and rhythm with no murmurs and normal pedal pulses bilaterally.  Abdomen Note: very obese 4 cm area above umbilicus consistent with ventral hernia sore not red cannot assess reduction due to obesity BS normal no peritonitis  Neurologic Neurologic evaluation reveals -alert and oriented x 3 with no impairment of recent or remote memory. Mental Status-Normal.  Musculoskeletal Normal Exam - Left-Upper Extremity Strength Normal and Lower Extremity Strength Normal. Normal Exam - Right-Upper Extremity Strength Normal and Lower Extremity Strength Normal.    Assessment & Plan (Jada Fass A. Val Farnam MD; 10/11/2015 3:10 PM)  VENTRAL HERNIA (K43.9) Impression: ventral hernia noted not sur e about reduction due to obesity he is not in any distress and this has been going on for 5 days very obese recommend open repair with mesh discussed non operative and operative options discussed mesh use /complications he wishes to proceed recommend weight loss since this increases risk of recurrence and complications    The risk of hernia repair include bleeding, infection, organ injury, bowel injury, bladder injury, nerve injury recurrent hernia, blood clots, worsening of underlying condition,  chronic pain, mesh use, open surgery, death, bowel injury, SBO and recurrence and the need for other operattions. Pt agrees to proceed  Current Plans Pt Education - [facility] Open Abdominal Surgery HCI Pt Education - [facility] Umbilical/ Inguinal Hernia HCI The anatomy & physiology of the abdominal wall was discussed. The pathophysiology of hernias was discussed. Natural history risks without surgery including progeressive enlargement, pain, incarceration, & strangulation was discussed. Contributors to complications such as smoking, obesity, diabetes, prior surgery, etc were discussed.  I feel the risks of no intervention will lead to serious problems that outweigh the operative risks; therefore, I recommended surgery to reduce and repair the hernia. I explained laparoscopic techniques with possible need for an open approach. I noted the probable use of mesh to patch and/or buttress the hernia repair  Risks such as bleeding, infection, abscess, need for further treatment, heart attack, death, and other risks were discussed. I noted a good likelihood this will help address the problem. Goals of post-operative recovery were discussed as well. Possibility that this will not correct all symptoms was explained. I stressed the importance of low-impact activity, aggressive pain control, avoiding constipation, & not pushing through pain to minimize risk of post-operative chronic pain or injury. Possibility of reherniation especially with smoking, obesity, diabetes, immunosuppression, and other health conditions was discussed. We will work to minimize complications.  An educational handout further explaining the pathology & treatment options was given as well. Questions were answered. The patient expresses understanding & wishes to proceed with surgery.  The anatomy & physiology of the abdominal wall was discussed. The pathophysiology of hernias was discussed. Natural history risks without surgery including progeressive  enlargement, pain, incarceration, & strangulation was discussed. Contributors to complications such as smoking, obesity, diabetes, prior surgery, etc were discussed.  I feel the risks of no intervention will lead to serious problems that outweigh the operative risks; therefore, I recommended surgery to reduce and repair the hernia. I explained laparoscopic techniques with possible need for an open approach. I noted the probable use of mesh to patch and/or buttress the hernia repair  Risks such as bleeding, infection, abscess, need for further treatment, heart attack, death, and other risks were discussed. I noted a good likelihood this will help address the problem. Goals of post-operative recovery were discussed as well. Possibility that this will not correct all symptoms was explained. I stressed the importance of low-impact activity, aggressive pain control, avoiding constipation, & not pushing through pain to minimize risk of post-operative chronic pain or injury. Possibility of reherniation especially with smoking, obesity, diabetes, immunosuppression, and other health conditions was discussed. We will work to minimize complications.  An educational handout further explaining the pathology & treatment options was given as well. Questions were answered. The patient expresses understanding & wishes to proceed with surgery.  Pt Education - Pamphlet Given - Hernia Surgery: discussed with patient and provided information. Pt Education - [facility] Hernia Post-Op HCI ([place]): discussed with patient and provided information. Pt Education - [facility] Pain Control (Gr

## 2015-10-16 ENCOUNTER — Ambulatory Visit: Payer: BLUE CROSS/BLUE SHIELD | Admitting: Family Medicine

## 2015-10-16 ENCOUNTER — Encounter (HOSPITAL_COMMUNITY): Payer: Self-pay

## 2015-10-16 ENCOUNTER — Encounter (HOSPITAL_COMMUNITY)
Admission: RE | Admit: 2015-10-16 | Discharge: 2015-10-16 | Disposition: A | Discharge status: Home / Self Care | Payer: BLUE CROSS/BLUE SHIELD | Source: Ambulatory Visit | Attending: Surgery | Admitting: Surgery

## 2015-10-16 DIAGNOSIS — Z6841 Body Mass Index (BMI) 40.0 and over, adult: Secondary | ICD-10-CM | POA: Diagnosis not present

## 2015-10-16 DIAGNOSIS — I10 Essential (primary) hypertension: Secondary | ICD-10-CM | POA: Diagnosis not present

## 2015-10-16 DIAGNOSIS — K439 Ventral hernia without obstruction or gangrene: Secondary | ICD-10-CM | POA: Diagnosis not present

## 2015-10-16 DIAGNOSIS — Z87891 Personal history of nicotine dependence: Secondary | ICD-10-CM | POA: Diagnosis not present

## 2015-10-16 DIAGNOSIS — E669 Obesity, unspecified: Secondary | ICD-10-CM | POA: Diagnosis not present

## 2015-10-16 HISTORY — DX: Unspecified osteoarthritis, unspecified site: M19.90

## 2015-10-16 HISTORY — DX: Unspecified asthma, uncomplicated: J45.909

## 2015-10-16 HISTORY — DX: Reserved for inherently not codable concepts without codable children: IMO0001

## 2015-10-16 LAB — BASIC METABOLIC PANEL
Anion gap: 12 (ref 5–15)
BUN: 12 mg/dL (ref 6–20)
CALCIUM: 9.1 mg/dL (ref 8.9–10.3)
CO2: 26 mmol/L (ref 22–32)
CREATININE: 0.85 mg/dL (ref 0.61–1.24)
Chloride: 103 mmol/L (ref 101–111)
GLUCOSE: 101 mg/dL — AB (ref 65–99)
Potassium: 4.2 mmol/L (ref 3.5–5.1)
Sodium: 141 mmol/L (ref 135–145)

## 2015-10-16 LAB — CBC
HCT: 46.4 % (ref 39.0–52.0)
Hemoglobin: 15.6 g/dL (ref 13.0–17.0)
MCH: 31 pg (ref 26.0–34.0)
MCHC: 33.6 g/dL (ref 30.0–36.0)
MCV: 92.2 fL (ref 78.0–100.0)
PLATELETS: 303 10*3/uL (ref 150–400)
RBC: 5.03 MIL/uL (ref 4.22–5.81)
RDW: 12.8 % (ref 11.5–15.5)
WBC: 11.5 10*3/uL — ABNORMAL HIGH (ref 4.0–10.5)

## 2015-10-16 NOTE — Pre-Procedure Instructions (Addendum)
    VITOR OVERBAUGH  10/16/2015      Your procedure is scheduled on Thursday, February 9.  Report to [facility] Admitting at 6:20 A.M.               Your surgery is scheduled for 8:25 AM   Call this number if you have problems the morning of surgery:[phone]                   For any other questions, please call [phone], Monday - Friday 8 AM - 4 PM.   Remember:  Do not eat food or drink liquids after midnight Wednesday .  Take these medicines the morning of surgery with A SIP OF WATER:None.  May use Asamanex Inhaler; Albuterol Inhaler if needed.                Stop taking Aspirin, Coumadin, Plavix, Effient and Herbal medications.  Do not take any NSAIDs ie: Ibuprofen,  Advil,Naproxen or any medication containing Aspirin.  Do not wear jewelry, make-up or nail polish.  Do not wear lotions, powders, or perfumes.    Men may shave face and neck.  Do not bring valuables to the hospital.  [facility] is not responsible for any belongings or valuables.  Contacts, dentures or bridgework may not be worn into surgery.  Leave your suitcase in the car.  After surgery it may be brought to your room.  For patients admitted to the hospital, discharge time will be determined by your treatment team.  Patients discharged the day of surgery will not be allowed to drive home.   Name and phone number of your driver:   -  Special instructions: Review  [facility] - Preparing For Surgery.  Please read over the following fact sheets that you were given. Pain Booklet, Coughing and Deep Breathing and Surgical Site Infection Prevention

## 2015-10-16 NOTE — Progress Notes (Signed)
   10/16/15 0925  OBSTRUCTIVE SLEEP APNEA  Have you ever been diagnosed with sleep apnea through a sleep study? No  Do you snore loudly (loud enough to be heard through closed doors)?  1  Do you often feel tired, fatigued, or sleepy during the daytime (such as falling asleep during driving or talking to someone)? 1  Has anyone observed you stop breathing during your sleep? 1  Do you have, or are you being treated for high blood pressure? 1  BMI more than 35 kg/m2? 1  Age > 50 (1-yes) 1  Neck circumference greater than:Male 16 inches or larger, Male 17inches or larger? 1 (67)  Male Gender (Yes=1) 1  Obstructive Sleep Apnea Score 8  Score 5 or greater  Results sent to PCP

## 2015-10-17 MED ORDER — CHLORHEXIDINE GLUCONATE 4 % EX LIQD: 1.0000 "application " | Freq: Once | CUTANEOUS | Status: DC

## 2015-10-17 MED ORDER — DEXTROSE 5 % IV SOLN
3.0000 g | INTRAVENOUS | Status: DC
  Filled 2015-10-17 (×2): qty 3000

## 2015-10-18 ENCOUNTER — Ambulatory Visit (HOSPITAL_COMMUNITY)
Admission: RE | Admit: 2015-10-18 | Discharge: 2015-10-18 | Disposition: A | Discharge status: Home / Self Care | Payer: BLUE CROSS/BLUE SHIELD | Source: Ambulatory Visit | Attending: Surgery | Admitting: Surgery

## 2015-10-18 ENCOUNTER — Encounter (HOSPITAL_COMMUNITY): Payer: Self-pay | Admitting: Surgery

## 2015-10-18 ENCOUNTER — Ambulatory Visit (HOSPITAL_COMMUNITY): Payer: BLUE CROSS/BLUE SHIELD | Admitting: Certified Registered Nurse Anesthetist

## 2015-10-18 ENCOUNTER — Encounter (HOSPITAL_COMMUNITY)
Admission: RE | Disposition: A | Discharge status: Home / Self Care | Payer: Self-pay | Source: Ambulatory Visit | Attending: Surgery

## 2015-10-18 DIAGNOSIS — E669 Obesity, unspecified: Secondary | ICD-10-CM | POA: Insufficient documentation

## 2015-10-18 DIAGNOSIS — I10 Essential (primary) hypertension: Secondary | ICD-10-CM | POA: Insufficient documentation

## 2015-10-18 DIAGNOSIS — K439 Ventral hernia without obstruction or gangrene: Secondary | ICD-10-CM | POA: Insufficient documentation

## 2015-10-18 DIAGNOSIS — Z87891 Personal history of nicotine dependence: Secondary | ICD-10-CM | POA: Insufficient documentation

## 2015-10-18 DIAGNOSIS — Z6841 Body Mass Index (BMI) 40.0 and over, adult: Secondary | ICD-10-CM | POA: Insufficient documentation

## 2015-10-18 HISTORY — PX: VENTRAL HERNIA REPAIR: SHX424

## 2015-10-18 HISTORY — PX: INSERTION OF MESH: SHX5868

## 2015-10-18 SURGERY — REPAIR, HERNIA, VENTRAL
Anesthesia: General | Site: Abdomen

## 2015-10-18 MED ORDER — MIDAZOLAM HCL 5 MG/5ML IJ SOLN
INTRAMUSCULAR | Status: DC | PRN
  Administered 2015-10-18: 2 mg via INTRAVENOUS

## 2015-10-18 MED ORDER — ROCURONIUM BROMIDE 50 MG/5ML IV SOLN
INTRAVENOUS | Status: AC
  Filled 2015-10-18: qty 1

## 2015-10-18 MED ORDER — OXYCODONE-ACETAMINOPHEN 5-325 MG PO TABS
ORAL_TABLET | ORAL | Status: AC
  Filled 2015-10-18: qty 2

## 2015-10-18 MED ORDER — ROCURONIUM BROMIDE 100 MG/10ML IV SOLN
INTRAVENOUS | Status: DC | PRN
  Administered 2015-10-18: 50 mg via INTRAVENOUS
  Administered 2015-10-18: 10 mg via INTRAVENOUS

## 2015-10-18 MED ORDER — SUCCINYLCHOLINE CHLORIDE 20 MG/ML IJ SOLN
INTRAMUSCULAR | Status: AC
  Filled 2015-10-18: qty 1

## 2015-10-18 MED ORDER — SUCCINYLCHOLINE CHLORIDE 20 MG/ML IJ SOLN
INTRAMUSCULAR | Status: DC | PRN
  Administered 2015-10-18: 120 mg via INTRAVENOUS

## 2015-10-18 MED ORDER — BUPIVACAINE-EPINEPHRINE (PF) 0.25% -1:200000 IJ SOLN
INTRAMUSCULAR | Status: DC | PRN
  Administered 2015-10-18: 10 mL via PERINEURAL

## 2015-10-18 MED ORDER — HYDROMORPHONE HCL 1 MG/ML IJ SOLN
0.2500 mg | INTRAMUSCULAR | Status: DC | PRN
  Administered 2015-10-18 (×2): 0.5 mg via INTRAVENOUS

## 2015-10-18 MED ORDER — ONDANSETRON HCL 4 MG/2ML IJ SOLN
INTRAMUSCULAR | Status: AC
  Filled 2015-10-18: qty 2

## 2015-10-18 MED ORDER — LIDOCAINE HCL (CARDIAC) 20 MG/ML IV SOLN
INTRAVENOUS | Status: DC | PRN
  Administered 2015-10-18: 80 mg via INTRAVENOUS

## 2015-10-18 MED ORDER — PROPOFOL 10 MG/ML IV BOLUS
INTRAVENOUS | Status: DC | PRN
  Administered 2015-10-18: 250 mg via INTRAVENOUS

## 2015-10-18 MED ORDER — FENTANYL CITRATE (PF) 100 MCG/2ML IJ SOLN
INTRAMUSCULAR | Status: DC | PRN
  Administered 2015-10-18: 150 ug via INTRAVENOUS
  Administered 2015-10-18 (×2): 50 ug via INTRAVENOUS

## 2015-10-18 MED ORDER — DEXAMETHASONE SODIUM PHOSPHATE 10 MG/ML IJ SOLN
INTRAMUSCULAR | Status: AC
  Filled 2015-10-18: qty 1

## 2015-10-18 MED ORDER — FENTANYL CITRATE (PF) 250 MCG/5ML IJ SOLN
INTRAMUSCULAR | Status: AC
  Filled 2015-10-18: qty 5

## 2015-10-18 MED ORDER — LACTATED RINGERS IV SOLN
INTRAVENOUS | Status: DC
  Administered 2015-10-18 (×3): via INTRAVENOUS

## 2015-10-18 MED ORDER — OXYCODONE-ACETAMINOPHEN 5-325 MG PO TABS: 1.0000 | ORAL_TABLET | ORAL | Status: DC | PRN

## 2015-10-18 MED ORDER — PROMETHAZINE HCL 25 MG/ML IJ SOLN: 6.2500 mg | INTRAMUSCULAR | Status: DC | PRN

## 2015-10-18 MED ORDER — PROMETHAZINE HCL 25 MG/ML IJ SOLN
6.2500 mg | INTRAMUSCULAR | Status: DC | PRN
Start: 2015-10-18 — End: 2015-10-18

## 2015-10-18 MED ORDER — MIDAZOLAM HCL 2 MG/2ML IJ SOLN
INTRAMUSCULAR | Status: AC
  Filled 2015-10-18: qty 2

## 2015-10-18 MED ORDER — LIDOCAINE HCL (CARDIAC) 20 MG/ML IV SOLN
INTRAVENOUS | Status: AC
  Filled 2015-10-18: qty 5

## 2015-10-18 MED ORDER — ONDANSETRON HCL 4 MG/2ML IJ SOLN
INTRAMUSCULAR | Status: DC | PRN
  Administered 2015-10-18: 4 mg via INTRAVENOUS

## 2015-10-18 MED ORDER — BUPIVACAINE-EPINEPHRINE (PF) 0.25% -1:200000 IJ SOLN
INTRAMUSCULAR | Status: AC
  Filled 2015-10-18: qty 30

## 2015-10-18 MED ORDER — PROPOFOL 10 MG/ML IV BOLUS
INTRAVENOUS | Status: AC
  Filled 2015-10-18: qty 20

## 2015-10-18 MED ORDER — HYDROMORPHONE HCL 1 MG/ML IJ SOLN
INTRAMUSCULAR | Status: AC
  Filled 2015-10-18: qty 1

## 2015-10-18 MED ORDER — GLYCOPYRROLATE 0.2 MG/ML IJ SOLN
INTRAMUSCULAR | Status: DC | PRN
  Administered 2015-10-18: .8 mg via INTRAVENOUS

## 2015-10-18 MED ORDER — DEXAMETHASONE SODIUM PHOSPHATE 10 MG/ML IJ SOLN
INTRAMUSCULAR | Status: DC | PRN
  Administered 2015-10-18: 10 mg via INTRAVENOUS

## 2015-10-18 MED ORDER — OXYCODONE-ACETAMINOPHEN 5-325 MG PO TABS
2.0000 | ORAL_TABLET | Freq: Once | ORAL | Status: AC
  Administered 2015-10-18: 2 via ORAL

## 2015-10-18 MED ORDER — 0.9 % SODIUM CHLORIDE (POUR BTL) OPTIME
TOPICAL | Status: DC | PRN
  Administered 2015-10-18: 1000 mL

## 2015-10-18 MED ORDER — EPHEDRINE SULFATE 50 MG/ML IJ SOLN
INTRAMUSCULAR | Status: AC
  Filled 2015-10-18: qty 1

## 2015-10-18 MED ORDER — NEOSTIGMINE METHYLSULFATE 10 MG/10ML IV SOLN
INTRAVENOUS | Status: DC | PRN
  Administered 2015-10-18: 5 mg via INTRAVENOUS

## 2015-10-18 MED ORDER — VECURONIUM BROMIDE 10 MG IV SOLR
INTRAVENOUS | Status: DC | PRN
  Administered 2015-10-18: 1 mg via INTRAVENOUS

## 2015-10-18 SURGICAL SUPPLY — 40 items
BINDER ABD UNIV 10 28-50 (GAUZE/BANDAGES/DRESSINGS) ×1 IMPLANT
BINDER ABDOM UNIV 10 (GAUZE/BANDAGES/DRESSINGS) ×2
BLADE SURG ROTATE 9660 (MISCELLANEOUS) ×2 IMPLANT
CANISTER SUCTION 2500CC (MISCELLANEOUS) ×2 IMPLANT
CHLORAPREP W/TINT 26ML (MISCELLANEOUS) ×2 IMPLANT
COVER SURGICAL LIGHT HANDLE (MISCELLANEOUS) ×2 IMPLANT
DRAPE LAPAROSCOPIC ABDOMINAL (DRAPES) ×2 IMPLANT
ELECT CAUTERY BLADE 6.4 (BLADE) ×4 IMPLANT
ELECT REM PT RETURN 9FT ADLT (ELECTROSURGICAL) ×2
ELECTRODE REM PT RTRN 9FT ADLT (ELECTROSURGICAL) ×1 IMPLANT
GLOVE BIO SURGEON STRL SZ8 (GLOVE) ×2 IMPLANT
GLOVE BIOGEL PI IND STRL 7.0 (GLOVE) ×2 IMPLANT
GLOVE BIOGEL PI IND STRL 8 (GLOVE) ×1 IMPLANT
GLOVE BIOGEL PI INDICATOR 7.0 (GLOVE) ×2
GLOVE BIOGEL PI INDICATOR 8 (GLOVE) ×1
GLOVE SURG SS PI 6.5 STRL IVOR (GLOVE) ×2 IMPLANT
GLOVE SURG SS PI 7.0 STRL IVOR (GLOVE) ×2 IMPLANT
GOWN STRL REUS W/ TWL LRG LVL3 (GOWN DISPOSABLE) ×2 IMPLANT
GOWN STRL REUS W/ TWL XL LVL3 (GOWN DISPOSABLE) ×1 IMPLANT
GOWN STRL REUS W/TWL LRG LVL3 (GOWN DISPOSABLE) ×2
GOWN STRL REUS W/TWL XL LVL3 (GOWN DISPOSABLE) ×2
KIT BASIN OR (CUSTOM PROCEDURE TRAY) ×2 IMPLANT
KIT ROOM TURNOVER OR (KITS) ×2 IMPLANT
LIQUID BAND (GAUZE/BANDAGES/DRESSINGS) ×2 IMPLANT
MESH VENTRALEX ST 8CM LRG (Mesh General) ×2 IMPLANT
NS IRRIG 1000ML POUR BTL (IV SOLUTION) ×2 IMPLANT
PACK GENERAL/GYN (CUSTOM PROCEDURE TRAY) ×2 IMPLANT
PAD ARMBOARD 7.5X6 YLW CONV (MISCELLANEOUS) ×2 IMPLANT
PENCIL BUTTON HOLSTER BLD 10FT (ELECTRODE) ×2 IMPLANT
STAPLER VISISTAT 35W (STAPLE) ×2 IMPLANT
SUT MNCRL AB 4-0 PS2 18 (SUTURE) ×2 IMPLANT
SUT NOVA 1 T20/GS 25DT (SUTURE) ×8 IMPLANT
SUT PDS AB 1 TP1 96 (SUTURE) IMPLANT
SUT PROLENE 1 CT (SUTURE) IMPLANT
SUT VIC AB 3-0 54X BRD REEL (SUTURE) IMPLANT
SUT VIC AB 3-0 BRD 54 (SUTURE)
SUT VIC AB 3-0 SH 27 (SUTURE) ×1
SUT VIC AB 3-0 SH 27XBRD (SUTURE) ×1 IMPLANT
TOWEL OR 17X26 10 PK STRL BLUE (TOWEL DISPOSABLE) ×2 IMPLANT
TRAY FOLEY CATH 14FRSI W/METER (CATHETERS) IMPLANT

## 2015-10-18 NOTE — Interval H&P Note (Signed)
History and Physical Interval Note:  10/18/2015 7:38 AM  Alex Oneal  has presented today for surgery, with the diagnosis of Ventral hernia  The various methods of treatment have been discussed with the patient and family. After consideration of risks, benefits and other options for treatment, the patient has consented to  Procedure(s): HERNIA REPAIR VENTRAL ADULT (N/A) INSERTION OF MESH (N/A) as a surgical intervention .  The patient's history has been reviewed, patient examined, no change in status, stable for surgery.  I have reviewed the patient's chart and labs.  Questions were answered to the patient's satisfaction.    The risk of hernia repair include bleeding,  Infection,   Recurrence of the hernia,  Mesh use, chronic pain,  Organ injury,  Bowel injury,  Bladder injury,   nerve injury with numbness around the incision,  Death,  and worsening of preexisting  medical problems.  The alternatives to surgery have been discussed as well..  Long term expectations of both operative and non operative treatments have been discussed.   The patient agrees to proceed.  Chistian Kasler A.

## 2015-10-18 NOTE — Anesthesia Procedure Notes (Signed)
Procedure Name: Intubation Date/Time: 10/18/2015 8:50 AM Performed by: Little Ishikawa L Pre-anesthesia Checklist: Patient identified, Timeout performed, Emergency Drugs available, Suction available and Patient being monitored Patient Re-evaluated:Patient Re-evaluated prior to inductionOxygen Delivery Method: Circle system utilized Preoxygenation: Pre-oxygenation with 100% oxygen (two hand mask due to large beard) Intubation Type: IV induction Ventilation: Two handed mask ventilation required Laryngoscope Size: Mac and 4 Grade View: Grade II Tube type: Oral Tube size: 7.5 mm Number of attempts: 1 Airway Equipment and Method: Stylet Placement Confirmation: ETT inserted through vocal cords under direct vision,  positive ETCO2 and breath sounds checked- equal and bilateral Secured at: 23 cm Tube secured with: Tape Dental Injury: Teeth and Oropharynx as per pre-operative assessment

## 2015-10-18 NOTE — Anesthesia Postprocedure Evaluation (Signed)
Anesthesia Post Note  Patient: Alex Oneal  Procedure(s) Performed: Procedure(s) (LRB): HERNIA REPAIR VENTRAL ADULT (N/A) INSERTION OF MESH (N/A)  Patient location during evaluation: PACU Anesthesia Type: General Level of consciousness: awake Pain management: pain level controlled Vital Signs Assessment: post-procedure vital signs reviewed and stable Respiratory status: spontaneous breathing Cardiovascular status: stable Anesthetic complications: no    Last Vitals:  Filed Vitals:   10/18/15 1045 10/18/15 1053  BP:  116/81  Pulse: 71 84  Temp:    Resp: 8 16    Last Pain:  Filed Vitals:   10/18/15 1100  PainSc: 6                  EDWARDS,Kanetra Ho

## 2015-10-18 NOTE — H&P (View-Only) (Signed)
Alex Oneal 10/11/2015 2:38 PM Location: [facility] Patient #: [id number] DOB: Mar 09, 1966 Undefined / Language: [place] / Race: White Male  History of Present Illness Alex Oneal Fus A. Alex Oneal Tat MD; 10/11/2015 3:07 PM) Patient words: hernia    Pt sent at the request of Dr Para March for ventral hernia. Noticed some pain and swelling 5 days ago just above umbilicus. The area is sore. No nausea or vomiting. Bowels moving. No redness or drainage. Bulge above the umbilicus.  The patient is a 50 year old male.   Problem List/Past Medical Alex Oneal, Alex Oneal; 10/11/2015 2:45 PM) Acid Reflux / GERD Back Pain High blood pressure  Past Surgical History Alex Oneal, Alex Oneal; 10/11/2015 2:48 PM) Hemorrhoidectomy Tonsillectomy Oral Surgery  Diagnostic Studies History Alex Oneal, Alex Oneal; 10/11/2015 2:46 PM) Colonoscopy  Allergies Alex Oneal, Alex Oneal; 10/11/2015 2:48 PM) No Known Drug Allergies 10/11/2015  Medication History Alex Oneal, Alex Oneal; 10/11/2015 2:49 PM) Asmanex 60 Metered Doses (220MCG/INH Aero Pow Br Act, Inhalation) Active. Albuterol (90MCG/ACT Aerosol Soln, Inhalation) Active. HydroCHLOROthiazide (  Tablet, Oral) Active. Valsartan (  Tablet, Oral) Active.  Social History Alex Oneal, Alex Oneal; 10/11/2015 2:46 PM) Current tobacco use Former smoker. Alcohol use Moderate alcohol use. Caffeine use Coffee. Illicit drug use Prefer to discuss with provider.  Family History Alex Oneal, Alex Oneal; 10/11/2015 2:48 PM) Alcohol Abuse Arthritis Mother. Breast Cancer Mother. Colon Cancer Mother. Colon Polyps Mother. Heart Disease Mother. High Blood Pressure / Hypertension Father. Kidney Disease Mother. Ovarian Cancer Mother. Rectal Cancer Mother. Thyroid problems Mother.    Vitals Alex Oneal CMA; 10/11/2015 2:44 PM) 10/11/2015 2:44 PM Weight: 318.38 lb Height: 70in Body Surface Area: 2.54 m Body Mass Index: 45.68  kg/m  BP: 126/84 (Sitting, Left Arm, Standard)      Physical Exam (Alex Oneal A. Xion Debruyne MD; 10/11/2015 3:09 PM)  General Mental Status-Alert. General Appearance-Consistent with stated age. Hydration-Well hydrated. Voice-Normal.  Eye Eyeball - Bilateral-Extraocular movements intact. Sclera/Conjunctiva - Bilateral-No scleral icterus.  Chest and Lung Exam Chest and lung exam reveals -quiet, even and easy respiratory effort with no use of accessory muscles and on auscultation, normal breath sounds, no adventitious sounds and normal vocal resonance. Inspection Chest Wall - Normal. Back - normal.  Cardiovascular Cardiovascular examination reveals -normal heart sounds, regular rate and rhythm with no murmurs and normal pedal pulses bilaterally.  Abdomen Note: very obese 4 cm area above umbilicus consistent with ventral hernia sore not red cannot assess reduction due to obesity BS normal no peritonitis  Neurologic Neurologic evaluation reveals -alert and oriented x 3 with no impairment of recent or remote memory. Mental Status-Normal.  Musculoskeletal Normal Exam - Left-Upper Extremity Strength Normal and Lower Extremity Strength Normal. Normal Exam - Right-Upper Extremity Strength Normal and Lower Extremity Strength Normal.    Assessment & Plan (Alex Oneal A. Alter Moss MD; 10/11/2015 3:10 PM)  VENTRAL HERNIA (K43.9) Impression: ventral hernia noted not sur e about reduction due to obesity he is not in any distress and this has been going on for 5 days very obese recommend open repair with mesh discussed non operative and operative options discussed mesh use /complications he wishes to proceed recommend weight loss since this increases risk of recurrence and complications    The risk of hernia repair include bleeding, infection, organ injury, bowel injury, bladder injury, nerve injury recurrent hernia, blood clots, worsening of underlying condition,  chronic pain, mesh use, open surgery, death, bowel injury, SBO and recurrence and the need for other operattions. Pt agrees to proceed  Current Plans Pt Education - [facility] Open Abdominal Surgery HCI Pt Education - [facility] Umbilical/ Inguinal Hernia HCI The anatomy & physiology of the abdominal wall was discussed. The pathophysiology of hernias was discussed. Natural history risks without surgery including progeressive enlargement, pain, incarceration, & strangulation was discussed. Contributors to complications such as smoking, obesity, diabetes, prior surgery, etc were discussed.  I feel the risks of no intervention will lead to serious problems that outweigh the operative risks; therefore, I recommended surgery to reduce and repair the hernia. I explained laparoscopic techniques with possible need for an open approach. I noted the probable use of mesh to patch and/or buttress the hernia repair  Risks such as bleeding, infection, abscess, need for further treatment, heart attack, death, and other risks were discussed. I noted a good likelihood this will help address the problem. Goals of post-operative recovery were discussed as well. Possibility that this will not correct all symptoms was explained. I stressed the importance of low-impact activity, aggressive pain control, avoiding constipation, & not pushing through pain to minimize risk of post-operative chronic pain or injury. Possibility of reherniation especially with smoking, obesity, diabetes, immunosuppression, and other health conditions was discussed. We will work to minimize complications.  An educational handout further explaining the pathology & treatment options was given as well. Questions were answered. The patient expresses understanding & wishes to proceed with surgery.  The anatomy & physiology of the abdominal wall was discussed. The pathophysiology of hernias was discussed. Natural history risks without surgery including progeressive  enlargement, pain, incarceration, & strangulation was discussed. Contributors to complications such as smoking, obesity, diabetes, prior surgery, etc were discussed.  I feel the risks of no intervention will lead to serious problems that outweigh the operative risks; therefore, I recommended surgery to reduce and repair the hernia. I explained laparoscopic techniques with possible need for an open approach. I noted the probable use of mesh to patch and/or buttress the hernia repair  Risks such as bleeding, infection, abscess, need for further treatment, heart attack, death, and other risks were discussed. I noted a good likelihood this will help address the problem. Goals of post-operative recovery were discussed as well. Possibility that this will not correct all symptoms was explained. I stressed the importance of low-impact activity, aggressive pain control, avoiding constipation, & not pushing through pain to minimize risk of post-operative chronic pain or injury. Possibility of reherniation especially with smoking, obesity, diabetes, immunosuppression, and other health conditions was discussed. We will work to minimize complications.  An educational handout further explaining the pathology & treatment options was given as well. Questions were answered. The patient expresses understanding & wishes to proceed with surgery.  Pt Education - Pamphlet Given - Hernia Surgery: discussed with patient and provided information. Pt Education - [facility] Hernia Post-Op HCI (Gross): discussed with patient and provided information. Pt Education - [facility] Pain Control (Gr

## 2015-10-18 NOTE — Transfer of Care (Signed)
Immediate Anesthesia Transfer of Care Note  Patient: Alex Oneal  Procedure(s) Performed: Procedure(s): HERNIA REPAIR VENTRAL ADULT (N/A) INSERTION OF MESH (N/A)  Patient Location: PACU  Anesthesia Type:General  Level of Consciousness: awake  Airway & Oxygen Therapy: Patient Spontanous Breathing and Patient connected to nasal cannula oxygen  Post-op Assessment: Report given to RN, Post -op Vital signs reviewed and stable and Patient moving all extremities X 4  Post vital signs: stable  Last Vitals:  Filed Vitals:   10/18/15 0718  BP: 132/79  Pulse: 71  Temp: 36.8 C    Complications: No apparent anesthesia complications

## 2015-10-18 NOTE — Anesthesia Preprocedure Evaluation (Signed)
Anesthesia Evaluation  Patient identified by MRN, date of birth, ID band Patient awake    Reviewed: Allergy & Precautions, NPO status , Patient's Chart, lab work & pertinent test results  Airway Mallampati: II  TM Distance: >3 FB Neck ROM: Full    Dental   Pulmonary shortness of breath, asthma , former smoker,    breath sounds clear to auscultation       Cardiovascular hypertension,  Rhythm:Regular Rate:Normal     Neuro/Psych    GI/Hepatic Neg liver ROS, GERD  ,  Endo/Other  negative endocrine ROS  Renal/GU negative Renal ROS     Musculoskeletal   Abdominal   Peds  Hematology   Anesthesia Other Findings   Reproductive/Obstetrics                             Anesthesia Physical Anesthesia Plan  ASA: III  Anesthesia Plan:    Post-op Pain Management:    Induction: Intravenous  Airway Management Planned: Oral ETT  Additional Equipment:   Intra-op Plan:   Post-operative Plan: Extubation in OR  Informed Consent: I have reviewed the patients History and Physical, chart, labs and discussed the procedure including the risks, benefits and alternatives for the proposed anesthesia with the patient or authorized representative who has indicated his/her understanding and acceptance.   Dental advisory given  Plan Discussed with: CRNA and Anesthesiologist  Anesthesia Plan Comments:         Anesthesia Quick Evaluation

## 2015-10-18 NOTE — Op Note (Signed)
Preoperative diagnosis: 3 cm ventral hernia reducible  Postoperative diagnosis: Same  Procedure: Repair of reducible ventral hernia with mesh  Surgeon: Erroll Luna M.D.  Anesthesia: Gen. endotracheal anesthesia with 0.25% Sensorcaine local with epinephrine  EBL: Minimal  Specimen: Abdominal wall nodule to pathology  Drains: None  Indications for procedure: The patient is a 50 year old male whom I saw last week in the office tubal painful ventral hernia just above his umbilicus. Upon examination of reducible causing a lot of discomfort. He desired repair recommended open repair with mesh.The procedure has been discussed with the patient.  Alternative therapies have been discussed with the patient.  Operative risks include bleeding,  Infection,  Organ injury,  Nerve injury,  Blood vessel injury,  DVT,  Pulmonary embolism,  Death,  And possible reoperation.  Medical management risks include worsening of present situation.  The success of the procedure is 50 -90 % at treating patients symptoms.  The patient understands and agrees to proceed.The risk of hernia repair include bleeding,  Infection,   Recurrence of the hernia,  Mesh use, chronic pain,  Organ injury,  Bowel injury,  Bladder injury,   nerve injury with numbness around the incision,  Death,  and worsening of preexisting  medical problems.  The alternatives to surgery have been discussed as well..  Long term expectations of both operative and non operative treatments have been discussed.   The patient agrees to proceed.    Description of procedure: Patient was met in the holding area and questions were answered. He was taken back to the operating room and placed upon the OR table. After induction of general endotracheal anesthesia, the abdomen was prepped and draped in a sterile fashion. Timeout was done he received preoperative antibiotics. Hernias located just above the umbilicus. Midline incision was used just above this and dissection  was carried down to the subcutaneous fat. Of note she had a small umbilical hernia as well as a area of nodularity just above this corresponding wares hernia was noted in the office. The area was warded inflamed. I suspected this was a second hernia small with incarcerated preperitoneal fat. I dissected out the umbilical hernia and an open the fascia midline for approximately 3-4 cm to this nodule nodular area above it. I excised the nodular area and sent to pathology. I then dissected the peritoneal lining off the undersurface of the midline to create space for my mesh. I closed the peritoneum with 3-0 Vicryl. There is taken not to entrap or injure the intestine line flow this. A circular Bard mesh was used which was 6 cm in maximal diameter. This was secured to the undersurface of the fascia with #1 Novafil suture circumferentially. I then closed the fascia over the mesh with #1 Novafil. The subcutaneous to use fat was closed with 3-0 Vicryl. 4-0 Monocryl was used to close the skin a subcuticular fashion. A liquid adhesive applied. Binder placed. All final counts found to be correct sponge, needles and instruments. Patient was extubated in awoke taken to recovery in satisfactory condition.

## 2015-10-18 NOTE — Progress Notes (Signed)
Dr. Warren Lacy notified about patient inability to void despite attempts x5, fluids  offered, patient ambulated  4 times too. Order to put foley catheter and have it taken out in the office tomorrow.

## 2015-10-18 NOTE — Discharge Instructions (Signed)
[facility] _______[facility]  UMBILICAL OR INGUINAL HERNIA REPAIR: POST OP INSTRUCTIONS  Always review your discharge instruction sheet given to you by the facility where your surgery was performed. IF YOU HAVE DISABILITY OR FAMILY LEAVE FORMS, YOU MUST BRING THEM TO THE OFFICE FOR PROCESSING.   DO NOT GIVE THEM TO YOUR DOCTOR.  1. A  prescription for pain medication may be given to you upon discharge.  Take your pain medication as prescribed, if needed.  If narcotic pain medicine is not needed, then you may take acetaminophen (Tylenol) or ibuprofen (Advil) as needed. 2. Take your usually prescribed medications unless otherwise directed. 3. If you need a refill on your pain medication, please contact your pharmacy.  They will contact our office to request authorization. Prescriptions will not be filled after 5 pm or on week-ends. 4. You should follow a light diet the first 24 hours after arrival home, such as soup and crackers, etc.  Be sure to include lots of fluids daily.  Resume your normal diet the day after surgery. 5. Most patients will experience some swelling and bruising around the umbilicus or in the groin and scrotum.  Ice packs and reclining will help.  Swelling and bruising can take several days to resolve.  6. It is common to experience some constipation if taking pain medication after surgery.  Increasing fluid intake and taking a stool softener (such as Colace) will usually help or prevent this problem from occurring.  A mild laxative (Milk of Magnesia or Miralax) should be taken according to package directions if there are no bowel movements after 48 hours. 7. Unless discharge instructions indicate otherwise, you may remove your bandages 24-48 hours after surgery, and you may shower at that time.  You may have steri-strips (small skin tapes) in place directly over the incision.  These strips should be left on the skin for 7-10 days.  If your surgeon used skin glue on the  incision, you may shower in 24 hours.  The glue will flake off over the next 2-3 weeks.  Any sutures or staples will be removed at the office during your follow-up visit. 8. ACTIVITIES:  You may resume regular (light) daily activities beginning the next day--such as daily self-care, walking, climbing stairs--gradually increasing activities as tolerated.  You may have sexual intercourse when it is comfortable.  Refrain from any heavy lifting or straining until approved by your doctor. a. You may drive when you are no longer taking prescription pain medication, you can comfortably wear a seatbelt, and you can safely maneuver your car and apply brakes. b. RETURN TO WORK:  __________________________________________________________ 9. You should see your doctor in the office for a follow-up appointment approximately 2-3 weeks after your surgery.  Make sure that you call for this appointment within a day or two after you arrive home to insure a convenient appointment time. 10. OTHER INSTRUCTIONS:  __________________________________________________________________________________________________________________________________________________________________________________________  WHEN TO CALL YOUR DOCTOR: 1. Fever over 101.0 2. Inability to urinate 3. Nausea and/or vomiting 4. Extreme swelling or bruising 5. Continued bleeding from incision. 6. Increased pain, redness, or drainage from the incision  The clinic staff is available to answer your questions during regular business hours.  Please don't hesitate to call and ask to speak to one of the nurses for clinical concerns.  If you have a medical emergency, go to the nearest emergency room or call 911.  A surgeon from [facility] is always on call at the hospital     [address], [address], [place], [place]  [address] ?  [address] [address], [place], [place]   [address] [phone] ? [phone] ? FAX [phone] Web site:  [email or web address]  

## 2015-10-19 ENCOUNTER — Encounter (HOSPITAL_COMMUNITY): Payer: Self-pay | Admitting: Surgery

## 2015-11-13 ENCOUNTER — Encounter (HOSPITAL_COMMUNITY): Payer: Self-pay | Admitting: Surgery

## 2015-11-13 NOTE — Addendum Note (Signed)
Addendum  created 11/13/15 1506 by Judie Petitharlene Edwards, MD   Modules edited: Anesthesia Events

## 2016-01-25 ENCOUNTER — Ambulatory Visit (INDEPENDENT_AMBULATORY_CARE_PROVIDER_SITE_OTHER): Payer: BLUE CROSS/BLUE SHIELD | Admitting: Family Medicine

## 2016-01-25 ENCOUNTER — Encounter: Payer: Self-pay | Admitting: Family Medicine

## 2016-01-25 VITALS — BP 122/80 | HR 81 | Temp 98.6°F | Wt 314.8 lb

## 2016-01-25 DIAGNOSIS — M25572 Pain in left ankle and joints of left foot: Secondary | ICD-10-CM

## 2016-01-25 DIAGNOSIS — M79606 Pain in leg, unspecified: Secondary | ICD-10-CM | POA: Insufficient documentation

## 2016-01-25 DIAGNOSIS — M25579 Pain in unspecified ankle and joints of unspecified foot: Secondary | ICD-10-CM | POA: Insufficient documentation

## 2016-01-25 NOTE — Patient Instructions (Signed)
I think you either irritated the tendons and/or the nerve in the foot.  Update me if not better in the next week with the new shoes.   Take care.  Glad to see you.

## 2016-01-25 NOTE — Assessment & Plan Note (Signed)
Clearly better with new shoes.   I think he either irritated the tendons and/or the dorsal nerves in the foot.  He'll update me if not better in the next week with the new shoes.   F/u prn.

## 2016-01-25 NOTE — Progress Notes (Signed)
Pre visit review using our clinic review tool, if applicable. No additional management support is needed unless otherwise documented below in the visit note.  L foot pain.  He hit his foot about 4 weeks ago on an ottoman, hit the lateral side, hit hard per patient report.  Still with pain in the meantime, "the pain is moving around a little bit."  Aleve helps but the pain isn't resolved overall. No R foot pain.  Still can bear weight.  No pain walking.  Pain is intermittent.  No pain now.  No plantar pain.  It didn't bruise up prev.  Sometimes has a burning pain.  He feels better in the last 2 days with new shoes.    Meds, vitals, and allergies reviewed.   ROS: Per HPI unless specifically indicated in ROS section   nad L foot with normal inspection, normal sensation and DP pulse.  Med and lat mal not ttp Ankle and achilles not ttp Plantar side not ttp Lateral dorsal midfoot minimally tender but normal ROM foot and toes.  Can weight bear w/o pain No bruising.

## 2016-04-03 ENCOUNTER — Other Ambulatory Visit: Payer: Self-pay | Admitting: Family Medicine

## 2016-04-03 DIAGNOSIS — I10 Essential (primary) hypertension: Secondary | ICD-10-CM

## 2016-04-11 ENCOUNTER — Other Ambulatory Visit: Payer: Self-pay | Admitting: Family Medicine

## 2016-04-11 DIAGNOSIS — I10 Essential (primary) hypertension: Secondary | ICD-10-CM

## 2016-04-14 ENCOUNTER — Other Ambulatory Visit (INDEPENDENT_AMBULATORY_CARE_PROVIDER_SITE_OTHER): Payer: BLUE CROSS/BLUE SHIELD

## 2016-04-14 DIAGNOSIS — I10 Essential (primary) hypertension: Secondary | ICD-10-CM | POA: Diagnosis not present

## 2016-04-14 LAB — LIPID PANEL
CHOLESTEROL: 116 mg/dL (ref 0–200)
HDL: 36.1 mg/dL — ABNORMAL LOW (ref >39.00)
LDL CALC: 71 mg/dL (ref 0–99)
NonHDL: 79.99
TRIGLYCERIDES: 47 mg/dL (ref 0.0–149.0)
Total CHOL/HDL Ratio: 3
VLDL: 9.4 mg/dL (ref 0.0–40.0)

## 2016-04-14 LAB — COMPREHENSIVE METABOLIC PANEL
ALBUMIN: 3.7 g/dL (ref 3.5–5.2)
ALK PHOS: 68 U/L (ref 39–117)
ALT: 24 U/L (ref 0–53)
AST: 15 U/L (ref 0–37)
BUN: 18 mg/dL (ref 6–23)
CALCIUM: 9 mg/dL (ref 8.4–10.5)
CHLORIDE: 105 meq/L (ref 96–112)
CO2: 28 mEq/L (ref 19–32)
Creatinine, Ser: 0.79 mg/dL (ref 0.40–1.50)
GFR: 109.4 mL/min (ref >60.00)
Glucose, Bld: 101 mg/dL — ABNORMAL HIGH (ref 70–99)
POTASSIUM: 4.7 meq/L (ref 3.5–5.1)
Sodium: 138 mEq/L (ref 135–145)
Total Bilirubin: 0.4 mg/dL (ref 0.2–1.2)
Total Protein: 6.4 g/dL (ref 6.0–8.3)

## 2016-04-18 ENCOUNTER — Ambulatory Visit (INDEPENDENT_AMBULATORY_CARE_PROVIDER_SITE_OTHER): Payer: BLUE CROSS/BLUE SHIELD | Admitting: Family Medicine

## 2016-04-18 ENCOUNTER — Encounter: Payer: Self-pay | Admitting: Family Medicine

## 2016-04-18 VITALS — BP 132/92 | HR 78 | Ht 69.75 in | Wt 311.8 lb

## 2016-04-18 DIAGNOSIS — R202 Paresthesia of skin: Secondary | ICD-10-CM | POA: Diagnosis not present

## 2016-04-18 DIAGNOSIS — I10 Essential (primary) hypertension: Secondary | ICD-10-CM

## 2016-04-18 DIAGNOSIS — Z0000 Encounter for general adult medical examination without abnormal findings: Secondary | ICD-10-CM

## 2016-04-18 DIAGNOSIS — Z125 Encounter for screening for malignant neoplasm of prostate: Secondary | ICD-10-CM

## 2016-04-18 DIAGNOSIS — Z119 Encounter for screening for infectious and parasitic diseases, unspecified: Secondary | ICD-10-CM | POA: Diagnosis not present

## 2016-04-18 DIAGNOSIS — R109 Unspecified abdominal pain: Secondary | ICD-10-CM | POA: Insufficient documentation

## 2016-04-18 DIAGNOSIS — J4530 Mild persistent asthma, uncomplicated: Secondary | ICD-10-CM

## 2016-04-18 LAB — TSH: TSH: 3.97 u[IU]/mL (ref 0.35–4.50)

## 2016-04-18 LAB — PSA: PSA: 2.04 ng/mL (ref 0.10–4.00)

## 2016-04-18 LAB — VITAMIN B12: VITAMIN B 12: 378 pg/mL (ref 211–911)

## 2016-04-18 MED ORDER — VALSARTAN 160 MG PO TABS: 160.0000 mg | ORAL_TABLET | Freq: Every day | ORAL | 3 refills | Status: DC

## 2016-04-18 MED ORDER — HYDROCHLOROTHIAZIDE 12.5 MG PO TABS: 12.5000 mg | ORAL_TABLET | Freq: Every day | ORAL | 3 refills | Status: DC

## 2016-04-18 MED ORDER — MOMETASONE FUROATE 220 MCG/INH IN AEPB: 2.0000 | INHALATION_SPRAY | Freq: Every day | RESPIRATORY_TRACT | 99 refills | Status: DC

## 2016-04-18 MED ORDER — ALBUTEROL SULFATE HFA 108 (90 BASE) MCG/ACT IN AERS: 2.0000 | INHALATION_SPRAY | Freq: Four times a day (QID) | RESPIRATORY_TRACT | 2 refills | Status: DC | PRN

## 2016-04-18 NOTE — Patient Instructions (Signed)
Go to the lab on the way out.  We'll contact you with your lab report. Cut back on alcohol in the meantime, but don't quit suddenly.  Try to keep working on your weight and gradually restart walking.  Update me if the tingling is worse in the meantime.  Take care.  Glad to see you.

## 2016-04-18 NOTE — Assessment & Plan Note (Signed)
Controlled, continue current inhaler with SABA prn, rarely used.  Ctab.

## 2016-04-18 NOTE — Assessment & Plan Note (Signed)
D/w pt about gradual weight loss with diet and exercise.  
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
## 2016-04-18 NOTE — Assessment & Plan Note (Signed)
Could be from B12, thyroid.  Check labs.  etoh could contribute, d/w pt about taper.  He agrees.  No other sign of weakness or tremor or other sensation changes.  He has normal sensation on the hands and feet today.

## 2016-04-18 NOTE — Assessment & Plan Note (Signed)
Tetanus 2011 PNA and shingles not due.   Flu shot done each fall.   Colonoscopy 2015 PSA screening d/w pt.  He opted in.   Living will d/w pt.  Wife designated if patient were incapacitated.   Pt opts in for HCV screening.  D/w pt re: routine screening.   Pt opts in for HIV screening.  D/w pt re: routine screening.

## 2016-04-18 NOTE — Assessment & Plan Note (Signed)
Restart exercise, work on weight, change HCTZ to 12.5mg  daily, instead of episodic  use.  D/w pt.  He agrees.

## 2016-04-18 NOTE — Assessment & Plan Note (Signed)
No RUQ pain, mild and intermittent.  This could be abd wall strain, obliques strain.  He'll monitor in the meantime.  Given mild sx and duration, very likely NOT to be ominous process.  He agrees.

## 2016-04-19 LAB — HEPATITIS C ANTIBODY: HCV Ab: NEGATIVE

## 2016-04-19 LAB — HIV ANTIBODY (ROUTINE TESTING W REFLEX): HIV 1&2 Ab, 4th Generation: NONREACTIVE

## 2016-04-25 ENCOUNTER — Telehealth: Payer: Self-pay | Admitting: Family Medicine

## 2016-04-25 NOTE — Telephone Encounter (Signed)
Rec'd from [facility] forward 5 pages to Dr.Duncan

## 2016-07-25 ENCOUNTER — Ambulatory Visit (INDEPENDENT_AMBULATORY_CARE_PROVIDER_SITE_OTHER): Payer: BLUE CROSS/BLUE SHIELD

## 2016-07-25 DIAGNOSIS — Z23 Encounter for immunization: Secondary | ICD-10-CM | POA: Diagnosis not present

## 2016-09-15 ENCOUNTER — Ambulatory Visit (INDEPENDENT_AMBULATORY_CARE_PROVIDER_SITE_OTHER): Payer: BLUE CROSS/BLUE SHIELD | Admitting: Internal Medicine

## 2016-09-15 ENCOUNTER — Encounter: Payer: Self-pay | Admitting: Internal Medicine

## 2016-09-15 VITALS — BP 138/84 | HR 86 | Temp 100.1°F | Wt 323.0 lb

## 2016-09-15 DIAGNOSIS — J029 Acute pharyngitis, unspecified: Secondary | ICD-10-CM

## 2016-09-15 DIAGNOSIS — R0989 Other specified symptoms and signs involving the circulatory and respiratory systems: Secondary | ICD-10-CM | POA: Diagnosis not present

## 2016-09-15 DIAGNOSIS — R509 Fever, unspecified: Secondary | ICD-10-CM

## 2016-09-15 DIAGNOSIS — R52 Pain, unspecified: Secondary | ICD-10-CM | POA: Diagnosis not present

## 2016-09-15 NOTE — Patient Instructions (Signed)
Sore Throat When you have a sore throat, your throat may:  Hurt.  Burn.  Feel irritated.  Feel scratchy. Many things can cause a sore throat, including:  An infection.  Allergies.  Dryness in the air.  Smoke or pollution.  Gastroesophageal reflux disease (GERD).  A tumor. A sore throat can be the first sign of another sickness. It can happen with other problems, like coughing or a fever. Most sore throats go away without treatment. Follow these instructions at home:  Take over-the-counter medicines only as told by your doctor.  Drink enough fluids to keep your pee (urine) clear or pale yellow.  Rest when you feel you need to.  To help with pain, try:  Sipping warm liquids, such as broth, herbal tea, or warm water.  Eating or drinking cold or frozen liquids, such as frozen ice pops.  Gargling with a salt-water mixture 3-4 times a day or as needed. To make a salt-water mixture, add -1 tsp of salt in 1 cup of warm water. Mix it until you cannot see the salt anymore.  Sucking on hard candy or throat lozenges.  Putting a cool-mist humidifier in your bedroom at night.  Sitting in the bathroom with the door closed for 5-10 minutes while you run hot water in the shower.  Do not use any tobacco products, such as cigarettes, chewing tobacco, and e-cigarettes. If you need help quitting, ask your doctor. Contact a doctor if:  You have a fever for more than 2-3 days.  You keep having symptoms for more than 2-3 days.  Your throat does not get better in 7 days.  You have a fever and your symptoms suddenly get worse. Get help right away if:  You have trouble breathing.  You cannot swallow fluids, soft foods, or your saliva.  You have swelling in your throat or neck that gets worse.  You keep feeling like you are going to throw up (vomit).  You keep throwing up. This information is not intended to replace advice given to you by your health care provider. Make sure  you discuss any questions you have with your health care provider. Document Released: 06/03/2008 Document Revised: 04/20/2016 Document Reviewed: 06/15/2015 Elsevier Interactive Patient Education  2017 Elsevier Inc.  

## 2016-09-15 NOTE — Progress Notes (Signed)
Subjective:    Patient ID: Alex Oneal, male    DOB: October 14, 1965, 51 y.o.   MRN: [id number]  HPI  Pt presents to the clinic today with c/o runny nose, sore throat. This started last night. He is having pain with swallowing. He woke up this morning with fever, chills and body aches. He denies nasal congestion, ear pain, or cough. He has tried Naproxen with some relief. He has not had sick contacts that he is aware of. He is UTD on his flu shot.  Review of Systems      Past Medical History:  Diagnosis Date  . Abnormal biliary HIDA scan   . Arthritis   . Asthma    exercise induced  . Former smoker    QUIT 02/2005  . Glucose intolerance (impaired glucose tolerance)   . Gynecomastia, male   . Hypertension   . Obesity   . Shortness of breath dyspnea    with activfity    Current Outpatient Prescriptions  Medication Sig Dispense Refill  . albuterol (PROVENTIL HFA;VENTOLIN HFA) 108 (90 Base) MCG/ACT inhaler Inhale 2 puffs into the lungs every 6 (six) hours as needed for wheezing. 1 Inhaler 2  . hydrochlorothiazide (HYDRODIURIL) 12.5 MG tablet Take 1 tablet (12.5 mg total) by mouth daily. 90 tablet 3  . mometasone (ASMANEX 60 METERED DOSES) 220 MCG/INH inhaler Inhale 2 puffs into the lungs daily. 3 Inhaler prn  . valsartan (DIOVAN) 160 MG tablet Take 1 tablet (160 mg total) by mouth daily. 90 tablet 3   No current facility-administered medications for this visit.     Allergies  Allergen Reactions  . Vioxx [Rofecoxib] Other (See Comments)    GI upset but not a true allergy    Family History  Problem Relation Age of Onset  . Arthritis Mother   . Heart disease Mother 66  . Cancer Mother     colon, breast, ovarian  . Allergies Mother   . Kidney failure Mother   . Kidney disease Mother     died of renal failure  . Colon cancer Mother   . Arthritis Father   . Hypertension Father   . Allergies Father   . Asthma Father   . Parkinsonism Father     possible environmental  causes  . Arthritis Paternal Aunt   . Cancer Paternal Aunt   . Arthritis Paternal Uncle   . Cancer Paternal Uncle   . Arthritis Paternal Grandmother   . Arthritis Paternal Grandfather   . Cancer Paternal Grandfather   . Stroke Paternal Grandfather   . Allergies Brother   . Kidney failure Maternal Grandfather   . Prostate cancer Maternal Grandfather   . Aneurysm Neg Hx     Social History   Social History  . Marital status: Married    Spouse name: [name]  . Number of children: 1  . Years of education: N/A   Occupational History  . office manager/outside [employer]    Social History Main Topics  . Smoking status: Former Smoker    Packs/day: 2.00    Years: 25.00    Types: Cigarettes    Quit date: 02/10/2005  . Smokeless tobacco: Never Used  . Alcohol use 1.2 oz/week    2 Shots of liquor per week     Comment: 2 drinks daily  . Drug use:     Types: Marijuana     Comment: 2/4?17  . Sexual activity: Yes   Other Topics Concern  .  Not on file   Social History Narrative   Remarried 1997   Son born 2001   [employer]- office supervisor     Constitutional: Pt reports fever and chills. Denies malaise, fatigue, headache or abrupt weight changes.  HEENT: Pt reports runny nose, sore throat. Denies eye pain, eye redness, ear pain, ringing in the ears, wax buildup,  nasal congestion, bloody nose. Respiratory: Denies difficulty breathing, shortness of breath, cough or sputum production.   Musculoskeletal: Pt reports body aches. Denies decrease in range of motion, difficulty with gait, or joint pain and swelling.    No other specific complaints in a complete review of systems (except as listed in HPI above).  Objective:   Physical Exam  BP 138/84   Pulse 86   Temp 100.1 F (37.8 C) (Oral)   Wt (!) 323 lb (146.5 kg)   SpO2 97%   BMI 46.68 kg/m  Wt Readings from Last 3 Encounters:  09/15/16 (!) 323 lb (146.5 kg)  04/18/16 (!) 311 lb 12.8  oz (141.4 kg)  01/25/16 (!) 314 lb 12 oz (142.8 kg)    General: Appears his stated age, in NAD. HEENT: Head: normal shape and size; Ears: Tm's pink but intact, normal light reflex, +serous effusion bilaterally; Nose: mucosa pink and moist, septum midline; Throat/Mouth: Teeth present, mucosa mildly erythematous and moist, + PND, no exudate, lesions or ulcerations noted.  Neck:  No adenopathy noted.  Pulmonary/Chest: Normal effort and positive vesicular breath sounds. No respiratory distress. No wheezes, rales or ronchi noted.   BMET    Component Value Date/Time   NA 138 04/14/2016 0919   K 4.7 04/14/2016 0919   CL 105 04/14/2016 0919   CO2 28 04/14/2016 0919   GLUCOSE 101 (H) 04/14/2016 0919   BUN 18 04/14/2016 0919   CREATININE 0.79 04/14/2016 0919   CREATININE 0.82 03/21/2014 0932   CALCIUM 9.0 04/14/2016 0919   GFRNONAA >60 10/16/2015 0959   GFRAA >60 10/16/2015 0959    Lipid Panel     Component Value Date/Time   CHOL 116 04/14/2016 0919   TRIG 47.0 04/14/2016 0919   HDL 36.10 (L) 04/14/2016 0919   CHOLHDL 3 04/14/2016 0919   VLDL 9.4 04/14/2016 0919   LDLCALC 71 04/14/2016 0919    CBC    Component Value Date/Time   WBC 11.5 (H) 10/16/2015 0959   RBC 5.03 10/16/2015 0959   HGB 15.6 10/16/2015 0959   HCT 46.4 10/16/2015 0959   PLT 303 10/16/2015 0959   MCV 92.2 10/16/2015 0959   MCH 31.0 10/16/2015 0959   MCHC 33.6 10/16/2015 0959   RDW 12.8 10/16/2015 0959   LYMPHSABS 2.6 03/21/2014 0932   MONOABS 0.9 03/21/2014 0932   EOSABS 0.2 03/21/2014 0932   BASOSABS 0.0 03/21/2014 0932    Hgb A1C Lab Results  Component Value Date   HGBA1C 6.1 (H) 03/08/2013         Assessment & Plan:  Runny nose, sore throat, fever, chills and body aches:  Likely viral RST: negative Rapid Flu: negative 80 mg Depo IM today  Continue Naproxen or Ibuprofen Return precautions discussed  RTC as needed or if symptoms persist or worsen  Dajsha Massaro, NP

## 2016-09-16 ENCOUNTER — Telehealth: Payer: Self-pay | Admitting: Family Medicine

## 2016-09-16 NOTE — Telephone Encounter (Signed)
Pt has appt with Dr Patsy Lageropland on 09/17/16 at 10:15.

## 2016-09-16 NOTE — Telephone Encounter (Signed)
Patient Name: Alex Oneal  DOB: 07/12/1966    Initial Comment Caller woke up at 7am and sneezed. He felt pain like he was stabbed in the back, still having back pain.    Nurse Assessment  Nurse: Renaldo FiddlerAdkins, RN, Raynelle FanningJulie Date/Time Lamount Cohen(Eastern Time): 09/16/2016 3:28:55 PM  Confirm and document reason for call. If symptomatic, describe symptoms. ---Caller states she was seen on Monday, and dx with a virus. This morning he woke at 7 am and sneezed and felt like he was stabbed in the right flank pain. Each time he coughs or sneezes he feels the pain.  Does the patient have any new or worsening symptoms? ---Yes  Will a triage be completed? ---Yes  Related visit to physician within the last 2 weeks? ---No  Does the PT have any chronic conditions? (i.e. diabetes, asthma, etc.) ---Yes  List chronic conditions. ---Seen Monday, dx Viral URI. , Htn, Asthma  Is this a behavioral health or substance abuse call? ---No     Guidelines    Guideline Title Affirmed Question Affirmed Notes  Back Pain [1] Age > 50 AND [2] no history of prior similar back pain    Final Disposition User   See Physician within 24 Hours Renaldo FiddlerAdkins, RN, Raynelle FanningJulie    Comments  [email or web address]  Upgraded to see PCP within 24 hrs due to severity of pain caused from coughing, sneezing, etc.   Referrals  REFERRED TO PCP OFFICE   Disagree/Comply: Comply

## 2016-09-17 ENCOUNTER — Encounter: Payer: Self-pay | Admitting: Family Medicine

## 2016-09-17 ENCOUNTER — Ambulatory Visit (INDEPENDENT_AMBULATORY_CARE_PROVIDER_SITE_OTHER): Payer: BLUE CROSS/BLUE SHIELD | Admitting: Family Medicine

## 2016-09-17 VITALS — BP 140/84 | HR 77 | Temp 97.5°F | Ht 69.75 in | Wt 313.8 lb

## 2016-09-17 DIAGNOSIS — M545 Low back pain, unspecified: Secondary | ICD-10-CM

## 2016-09-17 DIAGNOSIS — R109 Unspecified abdominal pain: Secondary | ICD-10-CM | POA: Diagnosis not present

## 2016-09-17 LAB — POC URINALSYSI DIPSTICK (AUTOMATED)
Bilirubin, UA: NEGATIVE
Blood, UA: NEGATIVE
Glucose, UA: NEGATIVE
KETONES UA: NEGATIVE
Leukocytes, UA: NEGATIVE
Nitrite, UA: NEGATIVE
SPEC GRAV UA: 1.025
Urobilinogen, UA: 0.2
pH, UA: 6

## 2016-09-17 NOTE — Progress Notes (Signed)
Pre visit review using our clinic review tool, if applicable. No additional management support is needed unless otherwise documented below in the visit note. 

## 2016-09-17 NOTE — Progress Notes (Signed)
Dr. Karleen HampshireSpencer T. Aedin Jeansonne, MD, CAQ [facility] [address] [place], [id number] Phone: [phone] Fax: [phone]  09/17/2016  Patient: Alex Oneal, MRN: [id number], DOB: 11/27/1965, 51 y.o.  Primary Physician:  Crawford GivensGraham Duncan, MD   Chief Complaint  Patient presents with  . Back Pain    sneezed yesterday and felt like someone stab him in the back   Subjective:   Alex Oneal is a 51 y.o. very pleasant male patient who presents with the following:  New years or christmas weekend, had some pain in his back. Felt like poked with an ice pick. Yesterday morning, brought tears to his eyes and bad pain yesterday. Today - feels like it has moved.   He has been sick and he sneezes pretty bad yesterday and he had a bad pain in his back like it was an ice pick.  He has not any blood in his urine.  He is not having any numbness tingling, or radiculopathy in his legs.  No bowel or bladder incontinence.  Past Medical History, Surgical History, Social History, Family History, Problem List, Medications, and Allergies have been reviewed and updated if relevant.  Patient Active Problem List   Diagnosis Date Noted  . Abdominal wall pain 04/18/2016  . Paresthesia 04/18/2016  . Pain in joint, ankle and foot 01/25/2016  . Routine general medical examination at a health care facility 04/11/2015  . Advance care planning 04/11/2015  . Back pain 03/02/2015  . Abnormal biliary HIDA scan 10/24/2014  . Gastroesophageal reflux disease without esophagitis 03/21/2014  . Family history of colon cancer in mother 03/21/2014  . Seasonal allergic rhinitis 12/14/2013  . Asthma, mild persistent 09/23/2011  . Morbid obesity (HCC) 09/19/2011  . Glucose intolerance (impaired glucose tolerance) 09/19/2011  . Hypertension 09/19/2011    Past Medical History:  Diagnosis Date  . Abnormal biliary HIDA scan   . Arthritis   . Asthma    exercise induced  . Former smoker      QUIT 02/2005  . Glucose intolerance (impaired glucose tolerance)   . Gynecomastia, male   . Hypertension   . Obesity   . Shortness of breath dyspnea    with activfity    Past Surgical History:  Procedure Laterality Date  . COLONOSCOPY W/ POLYPECTOMY    . INSERTION OF MESH N/A 10/18/2015   Procedure: INSERTION OF MESH;  Surgeon: Harriette Bouillonhomas Cornett, MD;  Location: [facility] OR;  Service: General;  Laterality: N/A;  . TONSILLECTOMY    . VENTRAL HERNIA REPAIR N/A 10/18/2015   Procedure: HERNIA REPAIR VENTRAL ADULT;  Surgeon: Harriette Bouillonhomas Cornett, MD;  Location: [facility] OR;  Service: General;  Laterality: N/A;    Social History   Social History  . Marital status: Married    Spouse name: Judeth CornfieldStephanie  . Number of children: 1  . Years of education: N/A   Occupational History  . office manager/outside [employer]    Social History Main Topics  . Smoking status: Former Smoker    Packs/day: 2.00    Years: 25.00    Types: Cigarettes    Quit date: 02/10/2005  . Smokeless tobacco: Never Used  . Alcohol use 1.2 oz/week    2 Shots of liquor per week     Comment: 2 drinks daily  . Drug use:     Types: Marijuana     Comment: 2/4?17  . Sexual activity: Yes   Other Topics Concern  . Not  on file   Social History Narrative   Remarried 1997   Son born 2001   [employer]- office supervisor    Family History  Problem Relation Age of Onset  . Arthritis Mother   . Heart disease Mother 72  . Cancer Mother     colon, breast, ovarian  . Allergies Mother   . Kidney failure Mother   . Kidney disease Mother     died of renal failure  . Colon cancer Mother   . Arthritis Father   . Hypertension Father   . Allergies Father   . Asthma Father   . Parkinsonism Father     possible environmental causes  . Arthritis Paternal Aunt   . Cancer Paternal Aunt   . Arthritis Paternal Uncle   . Cancer Paternal Uncle   . Arthritis Paternal Grandmother   . Arthritis Paternal Grandfather   .  Cancer Paternal Grandfather   . Stroke Paternal Grandfather   . Allergies Brother   . Kidney failure Maternal Grandfather   . Prostate cancer Maternal Grandfather   . Aneurysm Neg Hx     Allergies  Allergen Reactions  . Vioxx [Rofecoxib] Other (See Comments)    GI upset but not a true allergy    Medication list reviewed and updated in full in [facility].  GEN: No fevers, chills. Nontoxic. Primarily MSK c/o today. MSK: Detailed in the HPI GI: tolerating PO intake without difficulty Neuro: No numbness, parasthesias, or tingling associated. Otherwise the pertinent positives of the ROS are noted above.   Objective:   BP 140/84   Pulse 77   Temp 97.5 F (36.4 C) (Oral)   Ht 5' 9.75" (1.772 m)   Wt (!) 313 lb 12 oz (142.3 kg)   BMI 45.34 kg/m    GEN: WDWN, NAD, Non-toxic, A & O x 3 HEENT: Atraumatic, Normocephalic. Neck supple. No masses, No LAD. Ears and Nose: No external deformity. EXTR: No c/c/e NEURO Normal gait.  PSYCH: Normally interactive. Conversant. Not depressed or anxious appearing.  Calm demeanor.    Full range of motion.  Normal extension, flexion, lateral bending as well as rotation. Nontender along all spinous processes. Nontender on all her rector spinae complex throughout lumbar and thoracic spine. Neurovascularly intact completely motor and sensory lower extremities.  Patient does have some moderate tenderness adjacent to his ribs  Radiology: Results for orders placed or performed in visit on 09/17/16  POCT Urinalysis Dipstick (Automated)  Result Value Ref Range   Color, UA yellow    Clarity, UA clear    Glucose, UA negative    Bilirubin, UA negative    Ketones, UA negative    Spec Grav, UA 1.025    Blood, UA negative    pH, UA 6.0    Protein, UA trace    Urobilinogen, UA 0.2    Nitrite, UA negative    Leukocytes, UA Negative Negative     Assessment and Plan:   Acute right-sided low back pain without sciatica - Plan: POCT  Urinalysis Dipstick (Automated)  Acute flank pain  I reassured the patient.  Likely secondary to muscular strain from sneezing, and it is Arty much improved today compared to yesterday.  There is no blood in his urine.  Recommended rest, Tylenol or NSAIDs if needed, heat.  Time.  Follow-up: No Follow-up on file.  There are no discontinued medications. Orders Placed This Encounter  Procedures  . POCT Urinalysis Dipstick (Automated)    Signed,  Marguerite Jarboe T. Lukah Goswami, MD   Allergies as of 09/17/2016      Reactions   Vioxx [rofecoxib] Other (See Comments)   GI upset but not a true allergy      Medication List       Accurate as of 09/17/16 11:59 PM. Always use your most recent med list.          albuterol 108 (90 Base) MCG/ACT inhaler Commonly known as:  PROVENTIL HFA;VENTOLIN HFA Inhale 2 puffs into the lungs every 6 (six) hours as needed for wheezing.   hydrochlorothiazide 12.5 MG tablet Commonly known as:  HYDRODIURIL Take 1 tablet (12.5 mg total) by mouth daily.   mometasone 220 MCG/INH inhaler Commonly known as:  ASMANEX 60 METERED DOSES Inhale 2 puffs into the lungs daily.   valsartan 160 MG tablet Commonly known as:  DIOVAN Take 1 tablet (160 mg total) by mouth daily.

## 2016-10-14 DIAGNOSIS — H40053 Ocular hypertension, bilateral: Secondary | ICD-10-CM | POA: Diagnosis not present

## 2016-10-14 DIAGNOSIS — H40013 Open angle with borderline findings, low risk, bilateral: Secondary | ICD-10-CM | POA: Diagnosis not present

## 2017-03-10 ENCOUNTER — Encounter: Payer: Self-pay | Admitting: Family Medicine

## 2017-03-10 ENCOUNTER — Ambulatory Visit (INDEPENDENT_AMBULATORY_CARE_PROVIDER_SITE_OTHER): Payer: BLUE CROSS/BLUE SHIELD | Admitting: Family Medicine

## 2017-03-10 VITALS — BP 130/84 | HR 80 | Temp 98.4°F | Wt 321.5 lb

## 2017-03-10 DIAGNOSIS — M25519 Pain in unspecified shoulder: Secondary | ICD-10-CM

## 2017-03-10 DIAGNOSIS — R29898 Other symptoms and signs involving the musculoskeletal system: Secondary | ICD-10-CM

## 2017-03-10 NOTE — Progress Notes (Signed)
Shoulder pain episodically, esp if he has a period of time w/o much movement.  In the AM, he'll have shoulder clicking.  He can't sleep with his arm raised now due to pain.  L shoulder with more pain and louder click. Can wake up with shoulder pain.  Not constant pain.  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  B knee clicking with ROM in the last few months.  No knee pain.    Meds, vitals, and allergies reviewed.   ROS: Per HPI unless specifically indicated in ROS section   nad ncat Neck supple, no LA Bilateral shoulder exam with normal range of motion, no arm drop. No pain on internal or external rotation initially but he does have left shoulder impingement that I can induce. Supraspinatus testing negative bilaterally.  AC joints not ttp.  Distally neurovascularly intact. With L scapular manipulation he has less symptoms of impingement. Knees with patellar click noted but no crepitus.

## 2017-03-10 NOTE — Patient Instructions (Signed)
Likely rotator cuff symptoms.  Use the exercises and update us as needed.   You may need to see Dr. Patsy Lageropland.  Take care.  Glad to see you.

## 2017-03-12 DIAGNOSIS — R29898 Other symptoms and signs involving the musculoskeletal system: Secondary | ICD-10-CM | POA: Insufficient documentation

## 2017-03-12 DIAGNOSIS — M25519 Pain in unspecified shoulder: Secondary | ICD-10-CM | POA: Insufficient documentation

## 2017-03-12 NOTE — Assessment & Plan Note (Signed)
He does not have significant crepitus on exam. He does appear to have a patellar click. Discussed with patient. I'm assuming some of his symptoms are gate related and that is likely affected by his weight. Discussed with him about weight loss.

## 2017-03-12 NOTE — Assessment & Plan Note (Signed)
Likely rotator cuff irritation. Anatomy discussed with patient. Handout given to patient with exercises. Discussed with patient. He understood. I think he can rehabilitation his. See after visit summary.

## 2017-04-12 ENCOUNTER — Other Ambulatory Visit: Payer: Self-pay | Admitting: Family Medicine

## 2017-04-12 DIAGNOSIS — Z125 Encounter for screening for malignant neoplasm of prostate: Secondary | ICD-10-CM

## 2017-04-12 DIAGNOSIS — I10 Essential (primary) hypertension: Secondary | ICD-10-CM

## 2017-04-16 ENCOUNTER — Other Ambulatory Visit (INDEPENDENT_AMBULATORY_CARE_PROVIDER_SITE_OTHER): Payer: BLUE CROSS/BLUE SHIELD

## 2017-04-16 DIAGNOSIS — Z125 Encounter for screening for malignant neoplasm of prostate: Secondary | ICD-10-CM | POA: Diagnosis not present

## 2017-04-16 DIAGNOSIS — I10 Essential (primary) hypertension: Secondary | ICD-10-CM | POA: Diagnosis not present

## 2017-04-16 LAB — LIPID PANEL
CHOL/HDL RATIO: 3
CHOLESTEROL: 122 mg/dL (ref 0–200)
HDL: 36.7 mg/dL — ABNORMAL LOW (ref >39.00)
LDL Cholesterol: 77 mg/dL (ref 0–99)
NonHDL: 85.46
TRIGLYCERIDES: 43 mg/dL (ref 0.0–149.0)
VLDL: 8.6 mg/dL (ref 0.0–40.0)

## 2017-04-16 LAB — COMPREHENSIVE METABOLIC PANEL
ALK PHOS: 78 U/L (ref 39–117)
ALT: 25 U/L (ref 0–53)
AST: 16 U/L (ref 0–37)
Albumin: 4 g/dL (ref 3.5–5.2)
BILIRUBIN TOTAL: 0.5 mg/dL (ref 0.2–1.2)
BUN: 16 mg/dL (ref 6–23)
CHLORIDE: 104 meq/L (ref 96–112)
CO2: 31 mEq/L (ref 19–32)
Calcium: 9.1 mg/dL (ref 8.4–10.5)
Creatinine, Ser: 0.9 mg/dL (ref 0.40–1.50)
GFR: 93.75 mL/min (ref >60.00)
GLUCOSE: 99 mg/dL (ref 70–99)
Potassium: 4.6 mEq/L (ref 3.5–5.1)
Sodium: 140 mEq/L (ref 135–145)
Total Protein: 6.3 g/dL (ref 6.0–8.3)

## 2017-04-16 LAB — PSA: PSA: 2.22 ng/mL (ref 0.10–4.00)

## 2017-04-21 ENCOUNTER — Encounter: Payer: Self-pay | Admitting: Family Medicine

## 2017-04-21 ENCOUNTER — Ambulatory Visit (INDEPENDENT_AMBULATORY_CARE_PROVIDER_SITE_OTHER): Payer: BLUE CROSS/BLUE SHIELD | Admitting: Family Medicine

## 2017-04-21 VITALS — BP 132/88 | HR 75 | Temp 98.4°F | Ht 70.0 in | Wt 321.0 lb

## 2017-04-21 DIAGNOSIS — J4530 Mild persistent asthma, uncomplicated: Secondary | ICD-10-CM

## 2017-04-21 DIAGNOSIS — I10 Essential (primary) hypertension: Secondary | ICD-10-CM

## 2017-04-21 DIAGNOSIS — Z0000 Encounter for general adult medical examination without abnormal findings: Secondary | ICD-10-CM

## 2017-04-21 DIAGNOSIS — Z8042 Family history of malignant neoplasm of prostate: Secondary | ICD-10-CM

## 2017-04-21 MED ORDER — ALBUTEROL SULFATE HFA 108 (90 BASE) MCG/ACT IN AERS: 2.0000 | INHALATION_SPRAY | Freq: Four times a day (QID) | RESPIRATORY_TRACT | 2 refills | Status: DC | PRN

## 2017-04-21 MED ORDER — LOSARTAN POTASSIUM 100 MG PO TABS: 50.0000 mg | ORAL_TABLET | Freq: Every day | ORAL | 3 refills | Status: DC

## 2017-04-21 MED ORDER — MOMETASONE FUROATE 220 MCG/INH IN AEPB: 2.0000 | INHALATION_SPRAY | Freq: Every day | RESPIRATORY_TRACT | 99 refills | Status: DC

## 2017-04-21 MED ORDER — HYDROCHLOROTHIAZIDE 12.5 MG PO TABS: 12.5000 mg | ORAL_TABLET | Freq: Every day | ORAL | 3 refills | Status: DC

## 2017-04-21 NOTE — Progress Notes (Signed)
CPE- See plan.  Routine anticipatory guidance given to patient.  See health maintenance.  The possibility exists that previously documented standard health maintenance information may have been brought forward from a previous encounter into this note.  If needed, that same information has been updated to reflect the current situation based on today's encounter.    Tetanus 2011 PNA and shingles not due.  Flu shot done each fall.  Colonoscopy 2015 PSA screening d/w pt.  PSA wnl.  FH noted.   Living will d/w pt. Wife designated if patient were incapacitated.  HIV and HCV screening prev done.    Hypertension:    Using medication without problems or lightheadedness: yes Chest pain with exertion:no Edema:no Short of breath: no Other issues: was on valsartan 160mg  a day.  D/w pt about recall.   Needs weight loss, d/w pt.    Asthma, former smoker.  No cough.  No wheeze.  Some occ allergy sx with season change/pollen.  Compliant with med. No recent SABA use.    Prev abd bloating and central abd discomfort. U/s unremarkable, HIDA noted for low EF and discomfort with CCK. He cancelled gen surgery eval prev. Cut out fat from diet and sx resolved prev.  D/w pt.   Shoulder pain is better after home exercise program, still with occ click noted on ROM.   PMH and SH reviewed  Meds, vitals, and allergies reviewed.   ROS: Per HPI.  Unless specifically indicated otherwise in HPI, the patient denies:  General: fever. Eyes: acute vision changes ENT: sore throat Cardiovascular: chest pain Respiratory: SOB GI: vomiting GU: dysuria Musculoskeletal: acute back pain Derm: acute rash Neuro: acute motor dysfunction Psych: worsening mood Endocrine: polydipsia Heme: bleeding Allergy: hayfever  GEN: nad, alert and oriented HEENT: mucous membranes moist NECK: supple w/o LA CV: rrr. PULM: ctab, no inc wob ABD: soft, +bs EXT: no edema SKIN: no acute rash

## 2017-04-21 NOTE — Patient Instructions (Signed)
Try 1/2 tab losartan.  If BP is consistently >140/>90 then increase to a whole tab.  Take care.  Glad to see you.  Update me as needed.

## 2017-04-22 DIAGNOSIS — Z8042 Family history of malignant neoplasm of prostate: Secondary | ICD-10-CM | POA: Insufficient documentation

## 2017-04-22 NOTE — Assessment & Plan Note (Signed)
Encourage weight loss with diet and exercise.

## 2017-04-22 NOTE — Assessment & Plan Note (Signed)
Tetanus 2011 PNA and shingles not due.  Flu shot done each fall.  Colonoscopy 2015 PSA screening d/w pt. PSA wnl.  FH noted.   Living will d/w pt. Wife designated if patient were incapacitated.  HIV and HCV screening prev done.   

## 2017-04-22 NOTE — Assessment & Plan Note (Signed)
Continue current meds. Doing well.  

## 2017-04-22 NOTE — Assessment & Plan Note (Signed)
Stop valsartan, change to losartan. See after visit summary. Discussed with patient. He agrees. Labs discussed with patient.

## 2017-05-29 ENCOUNTER — Ambulatory Visit (INDEPENDENT_AMBULATORY_CARE_PROVIDER_SITE_OTHER)
Admission: RE | Admit: 2017-05-29 | Discharge: 2017-05-29 | Disposition: A | Discharge status: Home / Self Care | Payer: BLUE CROSS/BLUE SHIELD | Source: Ambulatory Visit | Attending: Family Medicine | Admitting: Family Medicine

## 2017-05-29 ENCOUNTER — Encounter: Payer: Self-pay | Admitting: Family Medicine

## 2017-05-29 ENCOUNTER — Encounter (INDEPENDENT_AMBULATORY_CARE_PROVIDER_SITE_OTHER): Payer: Self-pay

## 2017-05-29 ENCOUNTER — Ambulatory Visit (INDEPENDENT_AMBULATORY_CARE_PROVIDER_SITE_OTHER): Payer: BLUE CROSS/BLUE SHIELD | Admitting: Family Medicine

## 2017-05-29 VITALS — BP 126/78 | HR 71 | Temp 98.6°F | Wt 318.5 lb

## 2017-05-29 DIAGNOSIS — R079 Chest pain, unspecified: Secondary | ICD-10-CM | POA: Diagnosis not present

## 2017-05-29 DIAGNOSIS — R109 Unspecified abdominal pain: Secondary | ICD-10-CM

## 2017-05-29 DIAGNOSIS — M546 Pain in thoracic spine: Secondary | ICD-10-CM | POA: Diagnosis not present

## 2017-05-29 DIAGNOSIS — N200 Calculus of kidney: Secondary | ICD-10-CM | POA: Diagnosis not present

## 2017-05-29 NOTE — Patient Instructions (Addendum)
Try heat vs ice for your back.   Use aleve in the meantime with food.   Go to the lab on the way out.  We'll contact you with your xray report. Take care.  Glad to see you.  Update me as needed.

## 2017-05-29 NOTE — Progress Notes (Signed)
Sx started months ago.  "It came and went and I thought was just pulling something." worse in the last 10 days, "If I sneeze I'm likely to hit the floor."  ttp over the R lower posterior ribs, inferior to the scapula w/o bruising.    No blood in stool, no black stools, no urinary changes.  Some R flank pain along the R oblique the last 2 days but not painful with a cough.    He had pain getting out of a chair recently.    HIDA scan prev with abnormally low gallbladder ejection fraction of only 15% consistent with gallbladder dysfunction. There is no evidence of cystic duct or common bile duct obstruction.  But he doesn't have RUQ pain.   He didn't get relief with muscle relaxers in the past.   Aleve helped some.   Meds, vitals, and allergies reviewed.   ROS: Per HPI unless specifically indicated in ROS section   GEN: nad, alert and oriented HEENT: mucous membranes moist NECK: supple w/o LA CV: rrr.  no murmur PULM: ctab, no inc wob ABD: soft, +bs, Right upper quadrant is not tender, abdomen not tender otherwise EXT: no edema SKIN: no acute rash Back not tender to palpation in the midline but he does have some right lateral back pain along the inferior ribs inferior to the scapula. No bruising. He has some mild right flank discomfort on palpation.

## 2017-05-31 ENCOUNTER — Other Ambulatory Visit: Payer: Self-pay | Admitting: Family Medicine

## 2017-05-31 DIAGNOSIS — R079 Chest pain, unspecified: Secondary | ICD-10-CM

## 2017-05-31 NOTE — Assessment & Plan Note (Addendum)
He does not have typical urinary symptoms. He has seen no blood in his urine. No stool changes. It would be atypical for him to have a renal stone causing symptoms without any urinary changes. My initial thought was that he had an abdominal wall strain and intercostal muscle pain/chest wall pain. Lungs clear. Okay for outpatient follow-up. We talked about initial workup.  Get plain films today. Try heat vs ice for back pain. Use aleve in the meantime with food.  He agrees.  Okay for outpatient f/u.

## 2017-06-01 ENCOUNTER — Telehealth: Payer: Self-pay

## 2017-06-01 NOTE — Telephone Encounter (Signed)
Erskine Squibb with [facility] radiology called report on CXR that Dr Para March has already addressed.

## 2017-06-08 ENCOUNTER — Ambulatory Visit (INDEPENDENT_AMBULATORY_CARE_PROVIDER_SITE_OTHER): Payer: BLUE CROSS/BLUE SHIELD | Admitting: Family Medicine

## 2017-06-08 ENCOUNTER — Encounter: Payer: Self-pay | Admitting: Family Medicine

## 2017-06-08 ENCOUNTER — Ambulatory Visit: Payer: BLUE CROSS/BLUE SHIELD | Admitting: Internal Medicine

## 2017-06-08 DIAGNOSIS — J029 Acute pharyngitis, unspecified: Secondary | ICD-10-CM

## 2017-06-08 LAB — POCT RAPID STREP A (OFFICE): Rapid Strep A Screen: NEGATIVE

## 2017-06-08 MED ORDER — DEXAMETHASONE SODIUM PHOSPHATE 100 MG/10ML IJ SOLN
10.0000 mg | Freq: Once | INTRAMUSCULAR | Status: AC
  Administered 2017-06-08: 10 mg via INTRAMUSCULAR

## 2017-06-08 NOTE — Patient Instructions (Addendum)
Great to see you.  This is likely a virus.

## 2017-06-08 NOTE — Progress Notes (Signed)
SUBJECTIVE: 51 y.o. male with sore throat, myalgias, swollen glands, headache and fever for 4 days. No history of rheumatic fever. Other symptoms: coryza, dry cough and productive cough.  Current Outpatient Prescriptions on File Prior to Visit  Medication Sig Dispense Refill  . albuterol (PROVENTIL HFA;VENTOLIN HFA) 108 (90 Base) MCG/ACT inhaler Inhale 2 puffs into the lungs every 6 (six) hours as needed for wheezing. 1 Inhaler 2  . hydrochlorothiazide (HYDRODIURIL) 12.5 MG tablet Take 1 tablet (12.5 mg total) by mouth daily. 90 tablet 3  . losartan (COZAAR) 100 MG tablet Take 0.5-1 tablets (50-100 mg total) by mouth daily. 90 tablet 3  . mometasone (ASMANEX 60 METERED DOSES) 220 MCG/INH inhaler Inhale 2 puffs into the lungs daily. 3 Inhaler prn   No current facility-administered medications on file prior to visit.     Allergies  Allergen Reactions  . Vioxx [Rofecoxib] Other (See Comments)    GI upset but not a true allergy    Past Medical History:  Diagnosis Date  . Abnormal biliary HIDA scan   . Arthritis   . Asthma    exercise induced  . Former smoker    QUIT 02/2005  . Glucose intolerance (impaired glucose tolerance)   . Gynecomastia, male   . Hypertension   . Obesity   . Shortness of breath dyspnea    with activfity    Past Surgical History:  Procedure Laterality Date  . COLONOSCOPY W/ POLYPECTOMY    . INSERTION OF MESH N/A 10/18/2015   Procedure: INSERTION OF MESH;  Surgeon: Harriette Bouillon, MD;  Location: [facility] OR;  Service: General;  Laterality: N/A;  . TONSILLECTOMY    . VENTRAL HERNIA REPAIR N/A 10/18/2015   Procedure: HERNIA REPAIR VENTRAL ADULT;  Surgeon: Harriette Bouillon, MD;  Location: [facility] OR;  Service: General;  Laterality: N/A;    Family History  Problem Relation Age of Onset  . Arthritis Mother   . Heart disease Mother 2  . Cancer Mother        colon, breast, ovarian  . Allergies Mother   . Kidney failure Mother   . Kidney disease Mother        died of  renal failure  . Colon cancer Mother   . Arthritis Father   . Hypertension Father   . Allergies Father   . Asthma Father   . Parkinsonism Father        possible environmental causes  . Arthritis Paternal Aunt   . Cancer Paternal Aunt   . Arthritis Paternal Uncle   . Cancer Paternal Uncle   . Arthritis Paternal Grandmother   . Arthritis Paternal Grandfather   . Cancer Paternal Grandfather   . Stroke Paternal Grandfather   . Allergies Brother   . Kidney failure Maternal Grandfather   . Prostate cancer Maternal Grandfather   . Aneurysm Neg Hx     Social History   Social History  . Marital status: Married    Spouse name: [name]  . Number of children: 1  . Years of education: N/A   Occupational History  . office manager/outside [employer]    Social History Main Topics  . Smoking status: Former Smoker    Packs/day: 2.00    Years: 25.00    Types: Cigarettes    Quit date: 02/10/2005  . Smokeless tobacco: Never Used  . Alcohol use 1.2 oz/week    2 Shots of liquor per week     Comment: 2 drinks daily  . Drug  use: No     Comment: 2/4?17  . Sexual activity: Yes   Other Topics Concern  . Not on file   Social History Narrative   Remarried 1997   Son born 2001   [employer]- office supervisor   The PMH, PSH, Social History, Family History, Medications, and allergies have been reviewed in [facility], and have been updated if relevant.  OBJECTIVE:   BP 130/90 (BP Location: Left Arm, Patient Position: Sitting, Cuff Size: Large)   Pulse 97   Temp 98.6 F (37 C) (Oral)   SpO2 97%   Vitals as noted above. Appears alert, well appearing, and in no distress and oriented to person, place, and time. Ears: bilateral TM's and external ear canals normal Oropharynx: tonsils hypertrophied with exudate Neck: supple, no significant adenopathy Lungs: clear to auscultation, no wheezes, rales or rhonchi, symmetric air entry Rapid Strep test is  negative  ASSESSMENT: Viral pharyngitis  PLAN: Per orders. IM decadron given in office to help with inflammation. Gargle, use acetaminophen or other OTC analgesic, and take Rx fully as prescribed. Call if other family members develop similar symptoms. See prn.

## 2017-06-08 NOTE — Addendum Note (Signed)
Addended by: Alisia Ferrari on: 06/08/2017 12:16 PM   Modules accepted: Orders

## 2017-06-10 ENCOUNTER — Telehealth: Payer: Self-pay

## 2017-06-10 MED ORDER — AMOXICILLIN-POT CLAVULANATE 875-125 MG PO TABS: 1.0000 | ORAL_TABLET | Freq: Two times a day (BID) | ORAL | 0 refills | Status: AC

## 2017-06-10 NOTE — Telephone Encounter (Signed)
Pt left v/m; pt seen 06/08/17; pt been taking OTC decongestant. Pt thinks may be bacterial infection. When pt blows nose mucus is thick and yellow; head congested. Pt requesting abx or does pt need reck.Pt request cb. [facility] [address]

## 2017-06-10 NOTE — Telephone Encounter (Signed)
I did not see him. Will route to Dr. Dayton Martes who saw pt.

## 2017-06-10 NOTE — Addendum Note (Signed)
Addended by: Dianne Dun on: 06/10/2017 12:30 PM   Modules accepted: Orders

## 2017-06-10 NOTE — Telephone Encounter (Signed)
eRx sent for Augmentin to pharmacy on file.

## 2017-06-10 NOTE — Telephone Encounter (Signed)
Patient advised of below and verbalized understanding.  

## 2017-06-12 ENCOUNTER — Ambulatory Visit (INDEPENDENT_AMBULATORY_CARE_PROVIDER_SITE_OTHER)
Admission: RE | Admit: 2017-06-12 | Discharge: 2017-06-12 | Disposition: A | Discharge status: Home / Self Care | Payer: BLUE CROSS/BLUE SHIELD | Source: Ambulatory Visit | Attending: Family Medicine | Admitting: Family Medicine

## 2017-06-12 DIAGNOSIS — R079 Chest pain, unspecified: Secondary | ICD-10-CM

## 2017-06-12 MED ORDER — IOPAMIDOL (ISOVUE-370) INJECTION 76%
100.0000 mL | Freq: Once | INTRAVENOUS | Status: AC | PRN
  Administered 2017-06-12: 100 mL via INTRAVENOUS

## 2017-06-14 ENCOUNTER — Encounter: Payer: Self-pay | Admitting: Family Medicine

## 2017-06-14 DIAGNOSIS — I700 Atherosclerosis of aorta: Secondary | ICD-10-CM | POA: Insufficient documentation

## 2017-06-14 DIAGNOSIS — R911 Solitary pulmonary nodule: Secondary | ICD-10-CM | POA: Insufficient documentation

## 2017-06-19 ENCOUNTER — Other Ambulatory Visit: Payer: Self-pay | Admitting: *Deleted

## 2017-06-19 ENCOUNTER — Other Ambulatory Visit: Payer: Self-pay | Admitting: Family Medicine

## 2017-06-19 MED ORDER — GABAPENTIN 100 MG PO CAPS: 100.0000 mg | ORAL_CAPSULE | Freq: Three times a day (TID) | ORAL | 3 refills | Status: DC

## 2017-08-18 DIAGNOSIS — J3489 Other specified disorders of nose and nasal sinuses: Secondary | ICD-10-CM | POA: Diagnosis not present

## 2017-08-18 DIAGNOSIS — J020 Streptococcal pharyngitis: Secondary | ICD-10-CM | POA: Diagnosis not present

## 2017-09-18 DIAGNOSIS — H6691 Otitis media, unspecified, right ear: Secondary | ICD-10-CM | POA: Diagnosis not present

## 2017-09-18 DIAGNOSIS — J3489 Other specified disorders of nose and nasal sinuses: Secondary | ICD-10-CM | POA: Diagnosis not present

## 2017-09-18 DIAGNOSIS — J020 Streptococcal pharyngitis: Secondary | ICD-10-CM | POA: Diagnosis not present

## 2017-11-11 ENCOUNTER — Ambulatory Visit: Payer: BLUE CROSS/BLUE SHIELD | Admitting: Family Medicine

## 2017-11-11 ENCOUNTER — Encounter: Payer: Self-pay | Admitting: Family Medicine

## 2017-11-11 VITALS — BP 128/80 | HR 72 | Temp 98.8°F | Wt 323.0 lb

## 2017-11-11 DIAGNOSIS — R140 Abdominal distension (gaseous): Secondary | ICD-10-CM

## 2017-11-11 NOTE — Progress Notes (Signed)
Taking 50mg  losartan.  Off HCTZ.  Med list updated.  Still on asmanex at baseline.  Prn SABA use, rare use.    Bloating.  "I'm tight."  He is frequently hungry, craving food.  He has been eating healthier foods.  He cut out etoh except for occ on 1 weekend day.  He cut out a lot of sugar.  He cut out soda.   He didn't cut out caffeine.    He had mild HA for about 1 week with some heartburn for the last 2 weeks.    No FCNAVD.  No burning with urination.  No blood in stool.  Still regular BMs.  No vision changes.  No rash.  Some fatigue. Some occ lower abd mild discomfort going on for about 2-4 weeks.   His total calorie intake is notably lower now.    Meds, vitals, and allergies reviewed.   ROS: Per HPI unless specifically indicated in ROS section   GEN: nad, alert and oriented HEENT: mucous membranes moist NECK: supple w/o LA CV: rrr PULM: ctab, no inc wob ABD: soft, +bs EXT: no edema SKIN: no acute rash

## 2017-11-11 NOTE — Patient Instructions (Signed)
Go to the lab on the way out.  We'll contact you with your lab report. It may be the significant decrease in calories that is contributing.   Reasonable to try reintroducing a few carbs to see if that helps with symptoms.  Take care.  Glad to see you.

## 2017-11-12 DIAGNOSIS — R140 Abdominal distension (gaseous): Secondary | ICD-10-CM | POA: Insufficient documentation

## 2017-11-12 LAB — COMPREHENSIVE METABOLIC PANEL
ALBUMIN: 3.9 g/dL (ref 3.5–5.2)
ALT: 25 U/L (ref 0–53)
AST: 14 U/L (ref 0–37)
Alkaline Phosphatase: 77 U/L (ref 39–117)
BUN: 20 mg/dL (ref 6–23)
CALCIUM: 9.7 mg/dL (ref 8.4–10.5)
CHLORIDE: 103 meq/L (ref 96–112)
CO2: 30 meq/L (ref 19–32)
Creatinine, Ser: 1.05 mg/dL (ref 0.40–1.50)
GFR: 78.3 mL/min (ref >60.00)
Glucose, Bld: 93 mg/dL (ref 70–99)
POTASSIUM: 4.2 meq/L (ref 3.5–5.1)
Sodium: 140 mEq/L (ref 135–145)
Total Bilirubin: 0.3 mg/dL (ref 0.2–1.2)
Total Protein: 7 g/dL (ref 6.0–8.3)

## 2017-11-12 LAB — CBC WITH DIFFERENTIAL/PLATELET
BASOS PCT: 1 % (ref 0.0–3.0)
Basophils Absolute: 0.2 10*3/uL — ABNORMAL HIGH (ref 0.0–0.1)
EOS ABS: 0.2 10*3/uL (ref 0.0–0.7)
EOS PCT: 1.1 % (ref 0.0–5.0)
HEMATOCRIT: 47 % (ref 39.0–52.0)
Hemoglobin: 16 g/dL (ref 13.0–17.0)
LYMPHS PCT: 20.7 % (ref 12.0–46.0)
Lymphs Abs: 3 10*3/uL (ref 0.7–4.0)
MCHC: 33.9 g/dL (ref 30.0–36.0)
MCV: 92.8 fl (ref 78.0–100.0)
MONOS PCT: 10.9 % (ref 3.0–12.0)
Monocytes Absolute: 1.6 10*3/uL — ABNORMAL HIGH (ref 0.1–1.0)
NEUTROS ABS: 9.6 10*3/uL — AB (ref 1.4–7.7)
Neutrophils Relative %: 66.3 % (ref 43.0–77.0)
PLATELETS: 352 10*3/uL (ref 150.0–400.0)
RBC: 5.07 Mil/uL (ref 4.22–5.81)
RDW: 13.3 % (ref 11.5–15.5)
WBC: 14.5 10*3/uL — AB (ref 4.0–10.5)

## 2017-11-12 LAB — TSH: TSH: 2.86 u[IU]/mL (ref 0.35–4.50)

## 2017-11-12 NOTE — Assessment & Plan Note (Signed)
With benign abdominal exam.  No jaundice.  No ominous symptoms.  Discussed with patient about options.  He could be that some of his symptoms are related to significant dietary changes.  Check routine labs today.  It may be reasonable to add back a small amount of carbohydrate into his diet just to see if this makes a difference.  Unclear if he is also having some relative hypoglycemia related to dietary changes.  He agrees with the plan.  See notes on labs.  He will update me as needed.

## 2017-11-18 ENCOUNTER — Telehealth: Payer: Self-pay | Admitting: Family Medicine

## 2017-11-18 NOTE — Telephone Encounter (Signed)
Patient phoned in for lab results. Reviewed lab results and physician's note with him.  He continues to feel bloated and have some mild Rt lower abdominal pain which he rates 1-2 on the pain scale. He said he hasn't added any carbs back in his diet, yet. Suggested he try a probiotic an hour or two before he usually notices the bloating.

## 2017-11-20 ENCOUNTER — Other Ambulatory Visit: Payer: Self-pay | Admitting: Family Medicine

## 2017-11-22 ENCOUNTER — Other Ambulatory Visit: Payer: Self-pay | Admitting: Family Medicine

## 2017-11-22 DIAGNOSIS — R140 Abdominal distension (gaseous): Secondary | ICD-10-CM

## 2017-11-27 ENCOUNTER — Telehealth: Payer: Self-pay | Admitting: Family Medicine

## 2017-11-27 NOTE — Telephone Encounter (Signed)
Copied from CRM [id number]. Topic: Quick Communication - See Telephone Encounter >> Nov 27, 2017 12:03 PM Trula SladeWalter, Linda F wrote: CRM for notification. See Telephone encounter for: 11/27/17. Patient would like to know if he has had a Tdap and if not does he need one.

## 2017-11-27 NOTE — Telephone Encounter (Signed)
I'll check the guidelines when possible.  Thanks.

## 2017-11-27 NOTE — Telephone Encounter (Signed)
I spoke with pt and his sister in law has a newborn and pt wants to know if he needs a tdap. Per immunization record pt had tdap 01/02/10.Please advise.

## 2017-11-29 NOTE — Telephone Encounter (Signed)
I checked the guidelines.  Given that his previous tetanus vaccination was a Tdap, and it was less than 10 years ago, he should be fine.  He would need routine vaccination 10 years after his last vaccine, or sooner if he had an injury in the meantime.  Thanks.

## 2017-11-30 NOTE — Telephone Encounter (Signed)
Patient advised.

## 2017-12-03 ENCOUNTER — Ambulatory Visit
Admission: RE | Admit: 2017-12-03 | Discharge: 2017-12-03 | Disposition: A | Discharge status: Home / Self Care | Payer: BLUE CROSS/BLUE SHIELD | Source: Ambulatory Visit | Attending: Family Medicine | Admitting: Family Medicine

## 2017-12-03 DIAGNOSIS — R140 Abdominal distension (gaseous): Secondary | ICD-10-CM | POA: Insufficient documentation

## 2017-12-03 DIAGNOSIS — N281 Cyst of kidney, acquired: Secondary | ICD-10-CM | POA: Diagnosis not present

## 2017-12-04 ENCOUNTER — Other Ambulatory Visit: Payer: Self-pay | Admitting: Family Medicine

## 2017-12-04 DIAGNOSIS — R140 Abdominal distension (gaseous): Secondary | ICD-10-CM

## 2017-12-07 ENCOUNTER — Telehealth: Payer: Self-pay | Admitting: Family Medicine

## 2017-12-07 DIAGNOSIS — R918 Other nonspecific abnormal finding of lung field: Secondary | ICD-10-CM

## 2017-12-07 NOTE — Telephone Encounter (Signed)
Due for f/u CT re: pulmonary nodule.  I put in the order.  Thanks.

## 2017-12-07 NOTE — Telephone Encounter (Signed)
Left detailed message on voicemail (on DPR) that one of the referral coordinators will be in touch to get the test set up for hm.

## 2017-12-08 ENCOUNTER — Telehealth: Payer: Self-pay | Admitting: Family Medicine

## 2017-12-08 NOTE — Telephone Encounter (Signed)
Copied from CRM [phone]#[id number]. Topic: General - Other >> Dec 08, 2017  3:15 PM Cecelia ByarsGreen, Clyde Zarrella L, RMA wrote: Reason for CRM: patient is requesting that referral for CT scan please be sent to [facility] fax number [phone]

## 2017-12-08 NOTE — Telephone Encounter (Signed)
Will forward this to Anastasiya who is handling referrals right now.   Thanks.

## 2017-12-08 NOTE — Telephone Encounter (Signed)
Pt advised. Order faxed over. Scheduled for 12/15/17 at [facility] -Consuella LoseAnastasiya V [place] Huberty, RMA

## 2017-12-09 NOTE — Telephone Encounter (Signed)
Noted. Thanks.  I'll await the results.

## 2017-12-10 ENCOUNTER — Telehealth: Payer: Self-pay | Admitting: *Deleted

## 2017-12-10 NOTE — Telephone Encounter (Signed)
Fax received from pharmacy stating that Asmanex 60 220mcg AER is on back order with no release date.  Could / would you change to an alternative medication?  [facility] [address].

## 2017-12-11 ENCOUNTER — Telehealth: Payer: Self-pay

## 2017-12-11 MED ORDER — FLUTICASONE PROPIONATE HFA 110 MCG/ACT IN AERO: 1.0000 | INHALATION_SPRAY | Freq: Two times a day (BID) | RESPIRATORY_TRACT | 3 refills | Status: DC

## 2017-12-11 NOTE — Telephone Encounter (Signed)
Changed to flovent. Sent.  Thanks.

## 2017-12-11 NOTE — Addendum Note (Signed)
Addended by: Joaquim NamUNCAN, GRAHAM S on: 12/11/2017 06:24 AM   Modules accepted: Orders

## 2017-12-11 NOTE — Telephone Encounter (Signed)
Patient advised.

## 2017-12-11 NOTE — Telephone Encounter (Signed)
I called patient about referral and he wanted to ask Dr Para Marchuncan about Asmanex, since it is not been made by manufacturer anymore, that's what he was told, and if this will be changed to something else? Please let patient know. Thank Neta Mendsyou-Lavonta Tillis V Sunshyne Horvath, RMA

## 2017-12-15 DIAGNOSIS — R918 Other nonspecific abnormal finding of lung field: Secondary | ICD-10-CM | POA: Diagnosis not present

## 2017-12-17 ENCOUNTER — Ambulatory Visit: Payer: BLUE CROSS/BLUE SHIELD

## 2017-12-20 ENCOUNTER — Telehealth: Payer: Self-pay | Admitting: Family Medicine

## 2017-12-20 NOTE — Telephone Encounter (Signed)
Notify pt.  F/u CT stable with no change in pulmonary nodules.  Needs f/u CT chest in 1 year, I put the reminder in the EMR about that.  Thanks.

## 2017-12-21 NOTE — Telephone Encounter (Signed)
Patient advised.

## 2017-12-27 ENCOUNTER — Telehealth: Payer: Self-pay | Admitting: Family Medicine

## 2017-12-27 NOTE — Telephone Encounter (Deleted)
-----   Message from Joaquim NamGraham S Berneita Sanagustin, MD sent at 12/04/2017  7:05 AM EDT ----- Regarding: possible CT to eval for horseshoe kidney? Consider order CT abdomen w/o contrast to evaluate for horseshoe kidney.  See if GI has already done this.

## 2017-12-27 NOTE — Telephone Encounter (Signed)
Call pt.  Prev imaging suggested CT abdomen w/o contrast to evaluate for horseshoe kidney.  He was going to see GI.  Did GI already handle this with f/u imaging of his abdomen?  Let me know.  Thanks.

## 2017-12-28 NOTE — Telephone Encounter (Signed)
Patient is seeing GI on May 3rd.  No other imaging otherwise.

## 2017-12-29 NOTE — Telephone Encounter (Signed)
I'll await the GI recs.  Thanks.

## 2018-01-08 ENCOUNTER — Telehealth: Payer: Self-pay | Admitting: Family Medicine

## 2018-01-08 DIAGNOSIS — R141 Gas pain: Secondary | ICD-10-CM | POA: Diagnosis not present

## 2018-01-08 DIAGNOSIS — K5790 Diverticulosis of intestine, part unspecified, without perforation or abscess without bleeding: Secondary | ICD-10-CM | POA: Insufficient documentation

## 2018-01-08 DIAGNOSIS — M1990 Unspecified osteoarthritis, unspecified site: Secondary | ICD-10-CM | POA: Insufficient documentation

## 2018-01-08 DIAGNOSIS — R142 Eructation: Secondary | ICD-10-CM | POA: Insufficient documentation

## 2018-01-08 NOTE — Telephone Encounter (Signed)
Copied from CRM [id number]. Topic: Quick Communication - See Telephone Encounter >> Jan 08, 2018 12:23 PM Terisa Starr wrote: CRM for notification. See Telephone encounter for: 01/08/18.   Patient would like Dr Para March or the nurse to call him back regarding his visit to the GI doctor this morning.  Call back is [phone]

## 2018-01-08 NOTE — Telephone Encounter (Signed)
Left message on patient's voicemail that I would try to call again on Monday.

## 2018-01-10 ENCOUNTER — Telehealth: Payer: Self-pay | Admitting: Family Medicine

## 2018-01-10 NOTE — Telephone Encounter (Signed)
Call pt.  Prev imaging suggested CT abdomen w/o contrast to evaluate for horseshoe kidney.  He saw GI but I didn't see anything to address this.  If he is willing to go though with the CT then let me know.  Thanks.

## 2018-01-10 NOTE — Telephone Encounter (Deleted)
-----   Message from [name], MD sent at 12/29/2017  6:10 AM EDT ----- Prev imaging suggested CT abdomen w/o contrast to evaluate for horseshoe kidney.  He was going to see GI 01/08/18.  Did GI already handle this with f/u imaging of his abdomen?  Check records.

## 2018-01-11 NOTE — Telephone Encounter (Signed)
Called patient, see next phone note.

## 2018-01-11 NOTE — Telephone Encounter (Signed)
Patient says he spoke with GI and Dr. Kinnie Scales is convinced the horseshoe kidney has always been there.  Dr. Kinnie Scales advised that his obesity is the cause of the fatty liver and will improve with weight loss.    Patient says that his bloating and digestive symptoms seem to be worse when he is at work and at home.  Patient and Dr. Kinnie Scales discussed possibly stress factors and ? Psychiatric evaluation but patient was told that this had to be handled through PCP.  Patient states that he is not interested in getting the CT of the abdomen because he is not interested in spending another $500 on a CT that should have been done along with the previous CT (that could have been lowered another 4 inches).

## 2018-01-11 NOTE — Telephone Encounter (Signed)
They had no way of knowing about the possible horseshoe kidney ahead of time.  If they had extended the prev CT at the time of the exam, then that would still have likely increased the cost of the test at that point.  And we still don't know if he has a significant structural renal abnormality without the f/u CT.  I'll defer to patient.  If he wants to go to psych, then we can refer him.  Thanks.

## 2018-01-12 NOTE — Telephone Encounter (Signed)
Patient states he wants Dr. Lianne Bushy opinion about the psych referral.  Appointment scheduled.

## 2018-01-15 ENCOUNTER — Ambulatory Visit: Payer: BLUE CROSS/BLUE SHIELD | Admitting: Family Medicine

## 2018-02-25 ENCOUNTER — Encounter: Payer: Self-pay | Admitting: Family Medicine

## 2018-02-25 ENCOUNTER — Ambulatory Visit: Payer: BLUE CROSS/BLUE SHIELD | Admitting: Family Medicine

## 2018-02-25 DIAGNOSIS — W57XXXA Bitten or stung by nonvenomous insect and other nonvenomous arthropods, initial encounter: Secondary | ICD-10-CM

## 2018-02-25 DIAGNOSIS — S40261A Insect bite (nonvenomous) of right shoulder, initial encounter: Secondary | ICD-10-CM

## 2018-02-25 MED ORDER — DOXYCYCLINE HYCLATE 100 MG PO TABS: 100.0000 mg | ORAL_TABLET | Freq: Two times a day (BID) | ORAL | 0 refills | Status: DC

## 2018-02-25 MED ORDER — CYCLOBENZAPRINE HCL 10 MG PO TABS: 5.0000 mg | ORAL_TABLET | Freq: Three times a day (TID) | ORAL | 0 refills | Status: DC | PRN

## 2018-02-25 NOTE — Patient Instructions (Signed)
Start doxy.   Use flexeril if needed for the muscle pain.  Use a heating pad.  Take care.  Glad to see you.  Update me as needed.

## 2018-02-25 NOTE — Progress Notes (Signed)
He had been using flovent 2 puffs once a day, not 1 puff BID.  D/w pt.  He'll make the change and update me as needed.    Tick bites.  R shoulder, about 10 days ago.  Since then with HA.  Neck is sore, pain with ROM, along with upper back.  Sx started after the tick bite.  No trigger o/w.  No trauma.  No fevers.  No vomiting.  Some nausea.  No rash other than some athlete's foot in the R 2nd web space and a fire ant bite on dorsum R foot.    Meds, vitals, and allergies reviewed.   ROS: Per HPI unless specifically indicated in ROS section   nad ncat Mmm OP wnl Neck supple but sore with ROM.  Not a stiff neck.  No LA rrr ctab R shoulder with resolving tick bite site.  No bullseye rash.

## 2018-02-26 DIAGNOSIS — W57XXXA Bitten or stung by nonvenomous insect and other nonvenomous arthropods, initial encounter: Secondary | ICD-10-CM | POA: Insufficient documentation

## 2018-02-26 NOTE — Assessment & Plan Note (Addendum)
Right shoulder.  Now with muscle soreness in the upper back.  Not a stiff neck.  No signs of meningitis.  Okay for outpatient follow-up.  No clear trigger for the muscle pain other than the tick bite.  Discussed with patient about options.  Laboratory evaluation would likely not change mgmt at this point.  Reasonable to start doxycycline and update me as needed.  He agrees.  Routine cautions given.  Can use Flexeril as needed for muscle pain.  Sedation caution.

## 2018-03-23 ENCOUNTER — Ambulatory Visit: Payer: BLUE CROSS/BLUE SHIELD | Admitting: Nurse Practitioner

## 2018-03-23 ENCOUNTER — Ambulatory Visit: Payer: Self-pay | Admitting: *Deleted

## 2018-03-23 ENCOUNTER — Encounter: Payer: Self-pay | Admitting: Nurse Practitioner

## 2018-03-23 VITALS — BP 132/80 | HR 98 | Temp 97.8°F | Ht 70.0 in | Wt 325.0 lb

## 2018-03-23 DIAGNOSIS — L03116 Cellulitis of left lower limb: Secondary | ICD-10-CM

## 2018-03-23 DIAGNOSIS — T63421A Toxic effect of venom of ants, accidental (unintentional), initial encounter: Secondary | ICD-10-CM | POA: Diagnosis not present

## 2018-03-23 MED ORDER — CEPHALEXIN 500 MG PO CAPS: 500.0000 mg | ORAL_CAPSULE | Freq: Three times a day (TID) | ORAL | 0 refills | Status: DC

## 2018-03-23 MED ORDER — CEFTRIAXONE SODIUM 250 MG IJ SOLR
250.0000 mg | Freq: Once | INTRAMUSCULAR | Status: AC
  Administered 2018-03-23: 250 mg via INTRAMUSCULAR

## 2018-03-23 MED ORDER — PRAMOXINE-CALAMINE 1-8 % EX LOTN: 1.0000 "application " | TOPICAL_LOTION | Freq: Two times a day (BID) | CUTANEOUS | 0 refills | Status: DC | PRN

## 2018-03-23 MED ORDER — PRAMOXINE HCL 1 % EX LOTN: 1.0000 "application " | TOPICAL_LOTION | Freq: Two times a day (BID) | CUTANEOUS | 0 refills | Status: DC

## 2018-03-23 MED ORDER — SACCHAROMYCES BOULARDII 250 MG PO CAPS: 250.0000 mg | ORAL_CAPSULE | Freq: Every day | ORAL | Status: DC

## 2018-03-23 NOTE — Telephone Encounter (Signed)
  Patient is calling to report he was bitten by fire ants over the week end- the areas are red and swollen- they are not getting better- pustules are forming and they are itching and painful. Appointment made for evaluation of bite areas. Reason for Disposition . [1] Red or very tender (to touch) area AND [2] getting larger over 48 hours after the bite  Answer Assessment - Initial Assessment Questions 1. TYPE of INSECT: "What type of insect was it?"      Fire ant 2. ONSET: "When did you get bitten?"      Sunday 3. LOCATION: "Where is the insect bite located?"      Left leg- 7 bites 4. REDNESS: "Is the area red or pink?" If so, ask "What size is area of redness?" (inches or cm). "When did the redness start?"     Red and swollen, tennis ball size 5. PAIN: "Is there any pain?" If so, ask: "How bad is it?"  (Scale 1-10; or mild, moderate, severe)     5 6. ITCHING: "Does it itch?" If so, ask: "How bad is the itch?"    - MILD: doesn't interfere with normal activities   - MODERATE - SEVERE: interferes with work, school, sleep, or other activities      severe 7. SWELLING: "How big is the swelling?" (inches, cm, or compare to coins)     Tennis ball size 8. OTHER SYMPTOMS: "Do you have any other symptoms?"  (e.g., difficulty breathing, hives)     no 9. PREGNANCY: "Is there any chance you are pregnant?" "When was your last menstrual period?"     n/a  Protocols used: INSECT BITE-A-AH

## 2018-03-23 NOTE — Patient Instructions (Signed)
Make 1week follow up appt with pcp or me for re eval of left leg.  Cellulitis, Adult Cellulitis is a skin infection. The infected area is usually red and sore. This condition occurs most often in the arms and lower legs. It is very important to get treated for this condition. Follow these instructions at home:  Take over-the-counter and prescription medicines only as told by your doctor.  If you were prescribed an antibiotic medicine, take it as told by your doctor. Do not stop taking the antibiotic even if you start to feel better.  Drink enough fluid to keep your pee (urine) clear or pale yellow.  Do not touch or rub the infected area.  Raise (elevate) the infected area above the level of your heart while you are sitting or lying down.  Place warm or cold wet cloths (warm or cold compresses) on the infected area. Do this as told by your doctor.  Keep all follow-up visits as told by your doctor. This is important. These visits let your doctor make sure your infection is not getting worse. Contact a doctor if:  You have a fever.  Your symptoms do not get better after 1-2 days of treatment.  Your bone or joint under the infected area starts to hurt after the skin has healed.  Your infection comes back. This can happen in the same area or another area.  You have a swollen bump in the infected area.  You have new symptoms.  You feel ill and also have muscle aches and pains. Get help right away if:  Your symptoms get worse.  You feel very sleepy.  You throw up (vomit) or have watery poop (diarrhea) for a long time.  There are red streaks coming from the infected area.  Your red area gets larger.  Your red area turns darker. This information is not intended to replace advice given to you by your health care provider. Make sure you discuss any questions you have with your health care provider. Document Released: 02/11/2008 Document Revised: 01/31/2016 Document Reviewed:  07/04/2015 Elsevier Interactive Patient Education  2018 [employer]

## 2018-03-23 NOTE — Progress Notes (Signed)
Subjective:  Patient ID: Alex Oneal, male    DOB: [birth year]  Age: 52 y.o. MRN: [id number]  CC: Insect Bite (got bit by fire ants,red,swelling,painful/2 days ago.)  Rash  This is a new problem. The current episode started in the past 7 days. The problem has been gradually worsening since onset. The affected locations include the left lower leg. The rash is characterized by burning, itchiness, pain and redness. He was exposed to an insect bite/sting. Pertinent negatives include no anorexia, congestion, cough, diarrhea, eye pain, facial edema, fatigue, fever, joint pain, nail changes, rhinorrhea, shortness of breath, sore throat or vomiting. Past treatments include anti-itch cream and antihistamine. The treatment provided mild relief. His past medical history is significant for allergies.   use of doxycycline last month for spider bite.  Reviewed PMS[facility] today.  Outpatient Medications Prior to Visit  Medication Sig Dispense Refill  . albuterol (PROVENTIL HFA;VENTOLIN HFA) 108 (90 Base) MCG/ACT inhaler Inhale 2 puffs into the lungs every 6 (six) hours as needed for wheezing. 1 Inhaler 2  . fluticasone (FLOVENT HFA) 110 MCG/ACT inhaler Inhale 1 puff into the lungs 2 (two) times daily. Rinse after use 3 Inhaler 3  . losartan (COZAAR) 100 MG tablet Take 0.5-1 tablets (50-100 mg total) by mouth daily. 90 tablet 3  . cyclobenzaprine (FLEXERIL) 10 MG tablet Take 0.5-1 tablets (5-10 mg total) by mouth 3 (three) times daily as needed for muscle spasms (sedation caution). (Patient not taking: Reported on 03/23/2018) 30 tablet 0  . doxycycline (VIBRA-TABS) 100 MG tablet Take 1 tablet (100 mg total) by mouth 2 (two) times daily. (Patient not taking: Reported on 03/23/2018) 20 tablet 0   No facility-administered medications prior to visit.     ROS See HPI  Objective:  BP 132/80   Pulse 98   Temp 97.8 F (36.6 C) (Oral)   Ht 5\' 10"  (1.778 m)   Wt (!) 325 lb (147.4 kg)   SpO2 97%   BMI 46.63 kg/m    BP Readings from Last 3 Encounters:  03/23/18 132/80  02/25/18 122/90  11/11/17 128/80    Wt Readings from Last 3 Encounters:  03/23/18 (!) 325 lb (147.4 kg)  02/25/18 (!) 324 lb 12 oz (147.3 kg)  11/11/17 (!) 323 lb (146.5 kg)    Physical Exam  Cardiovascular: Normal rate.  Pulmonary/Chest: Effort normal.  Skin: Rash noted. There is erythema.     Vitals reviewed.   Lab Results  Component Value Date   WBC 14.5 (H) 11/11/2017   HGB 16.0 11/11/2017   HCT 47.0 11/11/2017   PLT 352.0 11/11/2017   GLUCOSE 93 11/11/2017   CHOL 122 04/16/2017   TRIG 43.0 04/16/2017   HDL 36.70 (L) 04/16/2017   LDLCALC 77 04/16/2017   ALT 25 11/11/2017   AST 14 11/11/2017   NA 140 11/11/2017   K 4.2 11/11/2017   CL 103 11/11/2017   CREATININE 1.05 11/11/2017   BUN 20 11/11/2017   CO2 30 11/11/2017   TSH 2.86 11/11/2017   PSA 2.22 04/16/2017   HGBA1C 6.1 (H) 03/08/2013    US Abdomen Complete  Result Date: 12/03/2017 CLINICAL DATA:  Abdominal bloating for the past month associated with right lower quadrant pain. EXAM: ABDOMEN ULTRASOUND COMPLETE COMPARISON:  Right upper quadrant abdominal ultrasound of July 24, 2014 and nuclear medicine hepatobiliary scan of September 12, 2014. FINDINGS: Gallbladder: No gallstones or wall thickening visualized. No sonographic Murphy sign noted by sonographer. Common bile duct: Diameter: 4.1 mm Liver:  The hepatic echotexture is increased diffusely. The surface contour is smooth. There is no focal mass or ductal dilation. Portal vein is patent on color Doppler imaging with normal direction of blood flow towards the liver. IVC: No abnormality visualized. Pancreas: Visualization of the pancreatic head and tail is limited. The pancreatic body is grossly normal. Spleen: 7.6 cm and normal in echotexture. The possibility is raised of a horseshoe kidney. Visualization of the kidneys is limited due to the patient's body habitus. Right Kidney: Length: 10.7 cm. There is  an echogenic nonshadowing focus in the upper pole which may reflect a nonobstructing 9 mm stone. There is no hydronephrosis. Left Kidney: Length: 11 0.0 cm. The echotexture of the cortex is similar to that on the right. There is a parapelvic cyst measuring 3.1 x 2.7 x 2.8 cm. There is a mid pole cortical cyst measuring 1.6 x 1.8 x 1.7 cm. No hydronephrosis. Abdominal aorta: No aneurysm visualized. Other findings: Please see the note above regarding possible horseshoe kidney. IMPRESSION: No gallstones or sonographic evidence of acute cholecystitis. Of note is the fact that the patient did have an abnormal gallbladder ejection fraction on the September 22, 2014 nuclear medicine hepatobiliary scan. Increased hepatic echotexture most compatible with fatty infiltrative change. Limited visualization of the pancreas. No acute abnormalities of either kidney. Possible horseshoe kidney contour of the lower poles is raised. Given the lack of any previous abdominal cross-sectional imaging studies, noncontrast abdominal CT scanning would be a useful next imaging step to more completely evaluate the kidneys. Electronically Signed   By: David  [place] M.D.   On: 12/03/2017 09:29    Assessment & Plan:   Alex Oneal was seen today for insect bite.  Diagnoses and all orders for this visit:  Fire ant bite, accidental or unintentional, initial encounter -     cefTRIAXone (ROCEPHIN) injection 250 mg -     cephALEXin (KEFLEX) 500 MG capsule; Take 1 capsule (500 mg total) by mouth 3 (three) times daily. -     Discontinue: diphenhydramine-calamine (CALOHIST) 1-8 % LOTN; Apply 1 application topically 2 (two) times daily as needed. -     pramoxine (SARNA SENSITIVE) 1 % LOTN; Apply 1 application topically 2 (two) times daily. -     saccharomyces boulardii (FLORASTOR) 250 MG capsule; Take 1 capsule (250 mg total) by mouth daily.  Cellulitis of left lower extremity -     cefTRIAXone (ROCEPHIN) injection 250 mg -     cephALEXin (KEFLEX)  500 MG capsule; Take 1 capsule (500 mg total) by mouth 3 (three) times daily. -     pramoxine (SARNA SENSITIVE) 1 % LOTN; Apply 1 application topically 2 (two) times daily. -     saccharomyces boulardii (FLORASTOR) 250 MG capsule; Take 1 capsule (250 mg total) by mouth daily.   I have discontinued Marveen Reeks "Eric"'s diphenhydramine-calamine. I am also having him start on cephALEXin, pramoxine, and saccharomyces boulardii. Additionally, I am having him maintain his losartan, albuterol, fluticasone, doxycycline, and cyclobenzaprine. We will continue to administer cefTRIAXone.  Meds ordered this encounter  Medications  . cefTRIAXone (ROCEPHIN) injection 250 mg  . cephALEXin (KEFLEX) 500 MG capsule    Sig: Take 1 capsule (500 mg total) by mouth 3 (three) times daily.    Dispense:  21 capsule    Refill:  0    Order Specific Question:   Supervising Provider    Answer:   MATTHEWS, CODY [[id number]]  . DISCONTD: diphenhydramine-calamine (CALOHIST) 1-8 % LOTN  Sig: Apply 1 application topically 2 (two) times daily as needed.    Dispense:  177 mL    Refill:  0    Order Specific Question:   Supervising Provider    Answer:   MATTHEWS, CODY [[id number]]  . pramoxine (SARNA SENSITIVE) 1 % LOTN    Sig: Apply 1 application topically 2 (two) times daily.    Dispense:  118 mL    Refill:  0    Do not fill calohist    Order Specific Question:   Supervising Provider    Answer:   MATTHEWS, CODY [[id number]]  . saccharomyces boulardii (FLORASTOR) 250 MG capsule    Sig: Take 1 capsule (250 mg total) by mouth daily.    Dispense:  14 capsule    Order Specific Question:   Supervising Provider    Answer:   Everrett CoombeMATTHEWS, CODY [[id number]]    Follow-up: Return in about 1 week (around 03/30/2018) for cellulitis.  Alysia Pennaharlotte Brandi Tomlinson, NP

## 2018-03-30 ENCOUNTER — Ambulatory Visit: Payer: BLUE CROSS/BLUE SHIELD | Admitting: Nurse Practitioner

## 2018-04-26 ENCOUNTER — Other Ambulatory Visit: Payer: Self-pay | Admitting: Family Medicine

## 2018-04-26 DIAGNOSIS — Z8042 Family history of malignant neoplasm of prostate: Secondary | ICD-10-CM

## 2018-04-26 DIAGNOSIS — Z125 Encounter for screening for malignant neoplasm of prostate: Secondary | ICD-10-CM

## 2018-04-26 DIAGNOSIS — I10 Essential (primary) hypertension: Secondary | ICD-10-CM

## 2018-04-29 ENCOUNTER — Other Ambulatory Visit (INDEPENDENT_AMBULATORY_CARE_PROVIDER_SITE_OTHER): Payer: BLUE CROSS/BLUE SHIELD

## 2018-04-29 DIAGNOSIS — Z125 Encounter for screening for malignant neoplasm of prostate: Secondary | ICD-10-CM | POA: Diagnosis not present

## 2018-04-29 DIAGNOSIS — I10 Essential (primary) hypertension: Secondary | ICD-10-CM

## 2018-04-29 LAB — LIPID PANEL
CHOLESTEROL: 118 mg/dL (ref 0–200)
HDL: 37.8 mg/dL — AB (ref >39.00)
LDL CALC: 69 mg/dL (ref 0–99)
NonHDL: 79.7
TRIGLYCERIDES: 54 mg/dL (ref 0.0–149.0)
Total CHOL/HDL Ratio: 3
VLDL: 10.8 mg/dL (ref 0.0–40.0)

## 2018-04-29 LAB — COMPREHENSIVE METABOLIC PANEL
ALBUMIN: 3.8 g/dL (ref 3.5–5.2)
ALT: 26 U/L (ref 0–53)
AST: 16 U/L (ref 0–37)
Alkaline Phosphatase: 67 U/L (ref 39–117)
BILIRUBIN TOTAL: 0.7 mg/dL (ref 0.2–1.2)
BUN: 15 mg/dL (ref 6–23)
CALCIUM: 9.6 mg/dL (ref 8.4–10.5)
CO2: 32 meq/L (ref 19–32)
CREATININE: 0.89 mg/dL (ref 0.40–1.50)
Chloride: 103 mEq/L (ref 96–112)
GFR: 94.6 mL/min (ref >60.00)
Glucose, Bld: 114 mg/dL — ABNORMAL HIGH (ref 70–99)
Potassium: 4.6 mEq/L (ref 3.5–5.1)
Sodium: 140 mEq/L (ref 135–145)
Total Protein: 6.3 g/dL (ref 6.0–8.3)

## 2018-04-29 LAB — PSA: PSA: 2.09 ng/mL (ref 0.10–4.00)

## 2018-05-04 ENCOUNTER — Ambulatory Visit: Payer: BLUE CROSS/BLUE SHIELD | Admitting: Family Medicine

## 2018-05-04 ENCOUNTER — Encounter: Payer: Self-pay | Admitting: Family Medicine

## 2018-05-04 VITALS — BP 132/88 | HR 76 | Temp 98.4°F | Ht 70.0 in | Wt 325.0 lb

## 2018-05-04 DIAGNOSIS — Z0000 Encounter for general adult medical examination without abnormal findings: Secondary | ICD-10-CM

## 2018-05-04 DIAGNOSIS — R0683 Snoring: Secondary | ICD-10-CM

## 2018-05-04 DIAGNOSIS — J4530 Mild persistent asthma, uncomplicated: Secondary | ICD-10-CM

## 2018-05-04 DIAGNOSIS — T63421S Toxic effect of venom of ants, accidental (unintentional), sequela: Secondary | ICD-10-CM

## 2018-05-04 DIAGNOSIS — Z7189 Other specified counseling: Secondary | ICD-10-CM

## 2018-05-04 DIAGNOSIS — I10 Essential (primary) hypertension: Secondary | ICD-10-CM

## 2018-05-04 DIAGNOSIS — R948 Abnormal results of function studies of other organs and systems: Secondary | ICD-10-CM

## 2018-05-04 MED ORDER — ALBUTEROL SULFATE HFA 108 (90 BASE) MCG/ACT IN AERS: 2.0000 | INHALATION_SPRAY | Freq: Four times a day (QID) | RESPIRATORY_TRACT | 1 refills | Status: DC | PRN

## 2018-05-04 MED ORDER — LOSARTAN POTASSIUM 100 MG PO TABS: 50.0000 mg | ORAL_TABLET | Freq: Every day | ORAL | Status: DC

## 2018-05-04 MED ORDER — LOSARTAN POTASSIUM 100 MG PO TABS: 50.0000 mg | ORAL_TABLET | Freq: Every day | ORAL | 3 refills | Status: DC

## 2018-05-04 NOTE — Progress Notes (Signed)
CPE- See plan.  Routine anticipatory guidance given to patient.  See health maintenance.  The possibility exists that previously documented standard health maintenance information may have been brought forward from a previous encounter into this note.  If needed, that same information has been updated to reflect the current situation based on today's encounter.    Tetanus 2011 PNA and shingles not due.  Flu shot done each fall.  Colonoscopy 2015 PSA screening d/w pt. PSA wnl.  FH noted.   Living will d/w pt. Wife designated if patient were incapacitated.  HIV and HCV screening prev done.    Weight d/w pt.  "I can't stop eating and I don't know if it is boredom or depression."  He cleaned out the snacks from his office but then ended up replacing them.  We talked about his reward system with eating.  He is always thinking about his next meal.  He has some fatigue.  No SI/HI.  He changed pillows.  D/w pt about sleep apnea risk.  Prev would wake up from snoring but not recently.  He likely has RLS sx at night.    He has some nonspecific heaviness in the legs and "my legs don't feel right" but he doesn't have loss of sensation.  Unclear if this is positional with his chair at work but he still notes sx on the weekends.  Noted more on a hard chair.  No foot drop.    abd sx- Prev U/s unremarkable, HIDA noted for low EF and discomfort with CCK. He cancelled gen surgery eval. Cut out fat from diet and sx resolved. No FCNAVD. No complaints now.  D/w pt.  He wants to continue as is.  He has no stones on u/s.  He'll call back if sx and wants to see gen surgery.   His L shin is clearly better but still locally tender.  D/w pt.  Not a true allergy and likely wouldn't need epipen based on his hx, d/w pt.  He agrees.    Hypertension:    Using medication without problems or lightheadedness: yes Chest pain with exertion:no Edema:some BLE edema, more at the end of the day.   Short of breath: no Average  home BPs: D/w pt about losartan recall and he checked his lot numbers.    Asthma. Using flovent at baseline, but he is missing some of the PM doses.  No ADE on med.  Rare use of SABA.  He may not have used SABA in the last year.    PMH and SH reviewed  Meds, vitals, and allergies reviewed.   ROS: Per HPI.  Unless specifically indicated otherwise in HPI, the patient denies:  General: fever. Eyes: acute vision changes ENT: sore throat Cardiovascular: chest pain Respiratory: SOB GI: vomiting GU: dysuria Musculoskeletal: acute back pain Derm: acute rash Neuro: acute motor dysfunction Psych: worsening mood Endocrine: polydipsia Heme: bleeding Allergy: hayfever  GEN: nad, alert and oriented HEENT: mucous membranes moist, no TM erythema but B mild SOM noted (d/w pt about gentle valsalva as needed) NECK: supple w/o LA CV: rrr. PULM: ctab, no inc wob ABD: soft, +bs EXT: trace BLE edema SKIN: no acute rash but he has hemosiderin deposits He has mult skin tags.

## 2018-05-04 NOTE — Patient Instructions (Addendum)
We will call about your referral.  Shirlee LimerickMarion or Alvina Chounastasiya will call you if you don't see one of them on the way out.  Ask about a procedure appointment for me to remove the skin tags.  Take care.  Glad to see you.  Update me as needed.

## 2018-05-05 DIAGNOSIS — R0683 Snoring: Secondary | ICD-10-CM | POA: Insufficient documentation

## 2018-05-05 DIAGNOSIS — T63421A Toxic effect of venom of ants, accidental (unintentional), initial encounter: Secondary | ICD-10-CM | POA: Insufficient documentation

## 2018-05-05 NOTE — Assessment & Plan Note (Signed)
Controlled.  No recent use of albuterol.  Update me as needed.  He agrees.  Continue as is.

## 2018-05-05 NOTE — Assessment & Plan Note (Signed)
No change in meds at this point.  Discussed with patient about diet, exercise, etc.  See above.

## 2018-05-05 NOTE — Assessment & Plan Note (Signed)
Prev U/s unremarkable, HIDA noted for low EF and discomfort with CCK. He cancelled gen surgery eval. Cut out fat from diet and sx resolved. No FCNAVD. No complaints now.  D/w pt.  He wants to continue as is.  He has no stones on u/s.   he'll call back if sx and wants to see gen surgery.

## 2018-05-05 NOTE — Assessment & Plan Note (Signed)
Discussed diet, exercise, etc.

## 2018-05-05 NOTE — Assessment & Plan Note (Signed)
Tetanus 2011 PNA and shingles not due.  Flu shot done each fall.  Colonoscopy 2015 PSA screening d/w pt. PSA wnl.  FH noted.   Living will d/w pt. Wife designated if patient were incapacitated.  HIV and HCV screening prev done.

## 2018-05-05 NOTE — Assessment & Plan Note (Signed)
Clearly improved in the meantime.  Update me as needed.

## 2018-05-05 NOTE — Assessment & Plan Note (Signed)
Living will d/w pt.  Wife designated if patient were incapacitated.   ?

## 2018-05-05 NOTE — Assessment & Plan Note (Signed)
He could have sleep apnea.  This could be affecting his blood pressure, fatigue, etc.  Unclear how much fatigue is driving his food choices and urge to snack.  Reasonable to get checked for sleep apnea.  Refer.  He agrees.

## 2018-05-06 ENCOUNTER — Encounter: Payer: Self-pay | Admitting: Internal Medicine

## 2018-05-06 ENCOUNTER — Ambulatory Visit (INDEPENDENT_AMBULATORY_CARE_PROVIDER_SITE_OTHER): Payer: BLUE CROSS/BLUE SHIELD | Admitting: Internal Medicine

## 2018-05-06 VITALS — BP 140/94 | HR 78 | Ht 69.0 in | Wt 328.0 lb

## 2018-05-06 DIAGNOSIS — G4719 Other hypersomnia: Secondary | ICD-10-CM

## 2018-05-06 DIAGNOSIS — Z23 Encounter for immunization: Secondary | ICD-10-CM

## 2018-05-06 DIAGNOSIS — R911 Solitary pulmonary nodule: Secondary | ICD-10-CM

## 2018-05-06 DIAGNOSIS — J449 Chronic obstructive pulmonary disease, unspecified: Secondary | ICD-10-CM

## 2018-05-06 NOTE — Progress Notes (Signed)
Name: Alex Oneal MRN: [id number] DOB: December 25, 1965     CONSULTATION DATE: 8.29.19 REFERRING MD : Para March  CHIEF COMPLAINT: excessive daytime sleepiness  STUDIES:     CXR independently reviewed by Me  06/12/17  CT chest Independently reviewed by Me today largest of which within the right upper lobe measures 7 mm in diameter.  Needs follow up CT chest  HISTORY OF PRESENT ILLNESS: 52 year old pleasant white male seen today for problems with sleep Has been having excessive daytime sleepiness for many years Fatigue during the day Has not refreshed sleep when he wakes up He weighs approximately 225 pounds   Patient has been having extreme fatigue and tiredness, lack of energy +  very Loud snoring every night + struggling breathe at night and gasps for air  Patient also takes inhalers Flovent and albuterol as needed for underlying asthma COPD Patient states he takes his inhalers every day and he notices a big difference when he does not take them  Patient is a former tobacco abuser he quit 2007 but smoked 1 to 2 packs a day for approximately 20 years 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  has a past surgical history that includes Tonsillectomy; Colonoscopy w/ polypectomy; Ventral hernia repair (N/A, 10/18/2015); and Insertion of mesh (N/A, 10/18/2015). Prior to Admission medications   Medication Sig Start Date End Date Taking? Authorizing Provider  albuterol (PROVENTIL HFA;VENTOLIN HFA) 108 (90 Base) MCG/ACT inhaler Inhale 2 puffs into the lungs every 6 (six) hours as  needed for wheezing or shortness of breath. Hold for patient to request later. 05/04/18  Yes [name], MD  fluticasone (FLOVENT HFA) 110 MCG/ACT inhaler Inhale 1 puff into the lungs 2 (two) times daily. Rinse after use 12/11/17  Yes [name], MD  losartan (COZAAR) 100 MG tablet Take 0.5-1 tablets (50-100 mg total) by mouth daily. 05/04/18  Yes [name], MD   Allergies  Allergen Reactions  . Fire [employer] Other (See Comments)    Significant local reaction but no lip swelling.    . Vioxx [Rofecoxib] Other (See Comments)    GI upset but not a true allergy    FAMILY HISTORY:  family history includes Allergies in his brother, father, and mother; Arthritis in his father, mother, paternal aunt, paternal grandfather, paternal grandmother, and paternal uncle; Asthma in his father; Cancer in his mother, paternal aunt, paternal grandfather, and paternal uncle; Colon cancer in his mother; Heart disease (age of onset: 72) in his mother; Hypertension in his father; Kidney disease in his mother; Kidney failure in his maternal grandfather and mother; Parkinsonism in his father; Prostate cancer in his maternal grandfather; Stroke in his paternal grandfather. SOCIAL HISTORY:  reports that he quit smoking about 13 years ago. His smoking use included cigarettes. He has a 50.00 pack-year smoking history. He has never used smokeless tobacco. He reports that he drinks about 2.0 standard drinks of alcohol per week. He reports that he does not use drugs.  REVIEW OF SYSTEMS:   Constitutional: Negative for fever, chills, weight loss,+ malaise/fatigue and diaphoresis.  HENT: Negative  for hearing loss, ear pain, nosebleeds, congestion, sore throat, neck pain, tinnitus and ear discharge.   Eyes: Negative for blurred vision, double vision, photophobia, pain, discharge and redness.  Respiratory: Negative for cough, hemoptysis, sputum production, +shortness of breath, -wheezing and -stridor.   Cardiovascular:  Negative for chest pain, palpitations, orthopnea, claudication, leg swelling and PND.  Gastrointestinal: Negative for heartburn, nausea, vomiting, abdominal pain, diarrhea, constipation, blood in stool and melena.  Genitourinary: Negative for dysuria, urgency, frequency, hematuria and flank pain.  Musculoskeletal: Negative for myalgias, back pain, joint pain and falls.  Skin: Negative for itching and rash.  Neurological: Negative for dizziness, tingling, tremors, sensory change, speech change, focal weakness, seizures, loss of consciousness, weakness and headaches.  Endo/Heme/Allergies: Negative for environmental allergies and polydipsia. Does not bruise/bleed easily.  ALL OTHER ROS ARE NEGATIVE  BP (!) 140/94 (BP Location: Right Arm, Cuff Size: Large)   Pulse 78   Ht 5\' 9"  (1.753 m)   Wt (!) 328 lb (148.8 kg)   SpO2 98%   BMI 48.44 kg/m   Physical Examination:   GENERAL:NAD, no fevers, chills, no weakness no fatigue HEAD: Normocephalic, atraumatic.  EYES: Pupils equal, round, reactive to light. Extraocular muscles intact. No scleral icterus.  MOUTH: Moist mucosal membrane.   EAR, NOSE, THROAT: Clear without exudates. No external lesions.  NECK: Supple. No thyromegaly. No nodules. No JVD.  PULMONARY:CTA B/L no wheezes, no crackles, no rhonchi CARDIOVASCULAR: S1 and S2. Regular rate and rhythm. No murmurs, rubs, or gallops. No edema.  GASTROINTESTINAL: Soft, nontender, nondistended. No masses. Positive bowel sounds.  MUSCULOSKELETAL: +edema. Range of motion full in all extremities.  NEUROLOGIC: Cranial nerves II through XII are intact. No gross focal neurological deficits.  SKIN: No ulceration, lesions, rashes, or cyanosis. Skin warm and dry. Turgor intact.  PSYCHIATRIC: Mood, affect within normal limits. The patient is awake, alert and oriented x 3. Insight, judgment intact.      ASSESSMENT / PLAN: 52 year old morbidly obese white male seen today for signs and symptoms of  excessive daytime sleepiness with fatigue and non-refreshed sleep with snoring and gasping for air which strongly suggest obstructive sleep apnea in the setting of extensive smoke exposure with underlying COPD on inhaler therapy, associated with morbid obesity and deconditioned state.  Patient with intermittent lower extremity swelling which may suggest probable underlying diastolic cardiac dysfunction.  Patient does have a history of right upper lobe lung nodule approximately 7 mm that was found approximately 1 year ago.  Patient states that he has had follow-up CT scans in the past and is followed by his PCP   #1 excessive daytime sleepiness snoring and gasping for air strongly suggest sleep apnea Patient will need sleep study to confirm diagnosis  #2 COPD Gold stage B at this time Patient needs pulmonary function testing to assess underlying obstructive airways disease Continue inhalers as prescribed  #3Obesity -recommend significant weight loss -recommend changing diet  #4Deconditioned state -Recommend increased daily activity and exercise  #5 right upper lobe nodule-high risk for malignancy Follow-up CT chest recommended however patient wants to have this followed by his PCP   Patient satisfied with Plan of action and management. All questions answered Follow up in 3 months   Patrena Santalucia Santiago Gladavid Lyla Jasek, M.D.  [facility]  Medical Director [facility] Medical Director [facility]

## 2018-05-06 NOTE — Patient Instructions (Signed)
Please obtain PFT's Please obtain Sleep study  PCP to follow up CT chest  Continue inhalers as prescribed

## 2018-05-07 ENCOUNTER — Encounter: Payer: Self-pay | Admitting: Family Medicine

## 2018-05-07 ENCOUNTER — Ambulatory Visit: Payer: BLUE CROSS/BLUE SHIELD | Admitting: Family Medicine

## 2018-05-07 DIAGNOSIS — L989 Disorder of the skin and subcutaneous tissue, unspecified: Secondary | ICD-10-CM

## 2018-05-07 MED ORDER — DOXYCYCLINE HYCLATE 100 MG PO TABS: 100.0000 mg | ORAL_TABLET | Freq: Two times a day (BID) | ORAL | 0 refills | Status: DC

## 2018-05-07 NOTE — Patient Instructions (Signed)
Start doxy (sunburn caution) and use warm compresses several times a day.   Call the on call line this weekend if needed (severe pain, fever, spreading redness). Take care.  Glad to see you.

## 2018-05-07 NOTE — Progress Notes (Signed)
L sided buttock/upper thigh lesion noted in the last few days.  No fevers.  ttp locally.  No drainage.  Wasn't going on at last OV.  Asking about options.  Meds, vitals, and allergies reviewed.   ROS: Per HPI unless specifically indicated in ROS section   nad rrr Left upper thigh/lower buttock with small superficial lesion noted.  It looks like it is coming to a head but has not yet drained.  No spreading erythema.  Not really tender on exam.

## 2018-05-10 DIAGNOSIS — L989 Disorder of the skin and subcutaneous tissue, unspecified: Secondary | ICD-10-CM | POA: Insufficient documentation

## 2018-05-10 NOTE — Assessment & Plan Note (Signed)
This lesion is small.  The maximum external diameter in the long axis is just under 2 centimeters.  The short axis diameter is approximately 1 cm.  If there is a central component I suspect it is much smaller in volume than the external measurements.  It looks like it is coming to a head.  He feels well otherwise.  There is no spreading erythema and he is clearly not systemically ill.  If we tried to perform I&D it may be counterproductive.  The lesion is so small and may not be able to even pack it.  Discussed with patient about options.  Reasonable to start antibiotics and use warm compresses.  It may drain on his own since it is so superficial.  Routine cautions given and he understood.  If fever or spreading erythema, etc. then he should seek evaluation.  He agrees.  He will update me as needed.

## 2018-05-20 ENCOUNTER — Ambulatory Visit: Payer: BLUE CROSS/BLUE SHIELD | Admitting: Family Medicine

## 2018-06-08 ENCOUNTER — Encounter: Payer: Self-pay | Admitting: Internal Medicine

## 2018-06-08 DIAGNOSIS — G4719 Other hypersomnia: Secondary | ICD-10-CM

## 2018-06-08 DIAGNOSIS — G4733 Obstructive sleep apnea (adult) (pediatric): Secondary | ICD-10-CM

## 2018-06-14 ENCOUNTER — Telehealth: Payer: Self-pay

## 2018-06-14 DIAGNOSIS — G4733 Obstructive sleep apnea (adult) (pediatric): Secondary | ICD-10-CM

## 2018-06-14 NOTE — Telephone Encounter (Signed)
Spoke to patient regarding HST results. He stated he would like to go with a dental device instead of CPAP. Let him know I will confirm with Dr. Belia Heman its okay to proceed this way and will let him know.

## 2018-06-14 NOTE — Telephone Encounter (Signed)
LM on VM for sleep study results.   Severe obstructive sleep apnea with AHI of 52. Obesity with BMI of 48.  Recommend auto-CPAP with pressure range of 5-20 cm H2O. Weight loss would be beneficial.

## 2018-06-14 NOTE — Telephone Encounter (Signed)
Patient returning call for results 

## 2018-06-15 NOTE — Telephone Encounter (Signed)
LM on VM per Dr. Belia Heman, OK to go ahead with oral device. Patient to call if any questions. Order placed.

## 2018-06-22 ENCOUNTER — Telehealth: Payer: Self-pay | Admitting: Internal Medicine

## 2018-06-22 NOTE — Telephone Encounter (Signed)
Spoke to patient, we have attempted to fax order. Will continue to try to send to [facility] for evaluation. Patient aware.

## 2018-06-22 NOTE — Telephone Encounter (Signed)
Order has been faxed to [facility]. Confirmation received via fax that order has been successfully sent.  [facility] will contact patient to schedule appointment with Dr. Donnamarie Poag. Rhonda J Cobb

## 2018-06-22 NOTE — Telephone Encounter (Signed)
Please call regarding appointment with dentist.

## 2018-06-22 NOTE — Telephone Encounter (Signed)
Referral was faxed to [facility]. Referral was suppose to go to [facility]. Pt contacted by [facility].

## 2018-07-20 ENCOUNTER — Ambulatory Visit: Payer: BLUE CROSS/BLUE SHIELD | Attending: Internal Medicine

## 2018-07-20 DIAGNOSIS — J449 Chronic obstructive pulmonary disease, unspecified: Secondary | ICD-10-CM | POA: Diagnosis not present

## 2018-07-28 ENCOUNTER — Ambulatory Visit (INDEPENDENT_AMBULATORY_CARE_PROVIDER_SITE_OTHER): Payer: BLUE CROSS/BLUE SHIELD | Admitting: Internal Medicine

## 2018-07-28 ENCOUNTER — Encounter: Payer: Self-pay | Admitting: Internal Medicine

## 2018-07-28 VITALS — BP 142/90 | HR 86 | Resp 16 | Ht 69.0 in | Wt 331.0 lb

## 2018-07-28 DIAGNOSIS — J4520 Mild intermittent asthma, uncomplicated: Secondary | ICD-10-CM

## 2018-07-28 DIAGNOSIS — R918 Other nonspecific abnormal finding of lung field: Secondary | ICD-10-CM

## 2018-07-28 DIAGNOSIS — G4733 Obstructive sleep apnea (adult) (pediatric): Secondary | ICD-10-CM

## 2018-07-28 NOTE — Progress Notes (Signed)
Name: Alex ReeksJames E Bledsoe MRN: [id number] DOB: 05/15/1966     CONSULTATION DATE: 8.29.19 REFERRING MD : Para Marchduncan    STUDIES:     CXR independently reviewed by Me  06/12/17  CT chest Independently reviewed by Me today largest of which within the right upper lobe measures 7 mm in diameter.  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     CC Follow-up sleep apnea, SOB    Test results reviewed with patient today  PFTs ratio is 73% predicted FEV1 is 78% predicted FVC is 83% predicted Interpretation-no obvious evidence of obstructive or restrictive lung disease  Sleep study performed on 06/08/2018 shows AHI of 51 consistent with severe sleep apnea patient has not started on CPAP therapy He plans on using oral device to assess his sleep apnea  CT of the chest was obtained in April 2019 I am unable to open the file He is noted to have a 7 mm right upper lobe nodule He will need repeat CT chest in 6 months  No signs of exacerbation at this time No signs of infection this time no signs of heart failure at this time  Patient uses Flovent and albuterol as needed At this time I have advised patient to stop Flovent and to assess his respiratory status     PAST MEDICAL HISTORY :   has a past medical history of Abnormal biliary HIDA scan, Arthritis, Asthma, Former smoker, Glucose intolerance (impaired glucose tolerance), Gynecomastia, male, Hypertension, Obesity, and Shortness of breath dyspnea.  has a past surgical history that includes Tonsillectomy; Colonoscopy w/ polypectomy; Ventral hernia repair (N/A, 10/18/2015); and Insertion of mesh (N/A, 10/18/2015). Prior to Admission medications   Medication Sig Start Date End Date Taking? Authorizing Provider  albuterol (PROVENTIL HFA;VENTOLIN HFA) 108 (90 Base) MCG/ACT inhaler Inhale 2 puffs into the lungs every 6  (six) hours as needed for wheezing or shortness of breath. Hold for patient to request later. 05/04/18  Yes Joaquim Namuncan, Graham S, MD  fluticasone (FLOVENT HFA) 110 MCG/ACT inhaler Inhale 1 puff into the lungs 2 (two) times daily. Rinse after use 12/11/17  Yes Joaquim Namuncan, Graham S, MD  losartan (COZAAR) 100 MG tablet Take 0.5-1 tablets (50-100 mg total) by mouth daily. 05/04/18  Yes Joaquim Namuncan, Graham S, MD   Allergies  Allergen Reactions  . Fire [employer] Other (See Comments)    Significant local reaction but no lip swelling.    . Vioxx [Rofecoxib] Other (See Comments)    GI upset but not a true allergy      Review of Systems:  Gen:  Denies  fever, sweats, chills weigh loss  HEENT: Denies blurred vision, double vision, ear pain, eye pain, hearing loss, nose bleeds, sore throat Cardiac:  No dizziness, chest pain or heaviness, chest tightness,edema, No JVD Resp:   No cough, -sputum production, -shortness of breath,-wheezing, -hemoptysis,  Gi: Denies swallowing difficulty, stomach pain, nausea or vomiting, diarrhea, constipation, bowel incontinence Gu:  Denies bladder incontinence, burning urine Ext:   Denies Joint pain, stiffness or swelling Skin: Denies  skin rash, easy bruising or bleeding or hives Endoc:  Denies polyuria, polydipsia , polyphagia or weight change Psych:   Denies depression, insomnia or hallucinations  Other:  All other systems negative   BP (!) 142/90 (BP Location: Left Arm, Cuff Size: Large)   Pulse 86  Resp 16   Ht 5\' 9"  (1.753 m)   Wt (!) 331 lb (150.1 kg)   SpO2 97%   BMI 48.88 kg/m   Physical Examination:   GENERAL:NAD, no fevers, chills, no weakness no fatigue HEAD: Normocephalic, atraumatic.  EYES: Pupils equal, round, reactive to light. Extraocular muscles intact. No scleral icterus.  MOUTH: Moist mucosal membrane. Dentition intact. No abscess noted.  EAR, NOSE, THROAT: Clear without exudates. No external lesions.  NECK: Supple. No thyromegaly. No nodules. No JVD.    PULMONARY: CTA B/L no wheezing, rhonchi, crackles CARDIOVASCULAR: S1 and S2. Regular rate and rhythm. No murmurs, rubs, or gallops. No edema. Pedal pulses 2+ bilaterally.  GASTROINTESTINAL: Soft, nontender, nondistended. No masses. Positive bowel sounds. No hepatosplenomegaly.  MUSCULOSKELETAL: No swelling, clubbing, or edema. Range of motion full in all extremities.  NEUROLOGIC: Cranial nerves II through XII are intact. No gross focal neurological deficits. Sensation intact. Reflexes intact.  SKIN: No ulceration, lesions, rashes, or cyanosis. Skin warm and dry. Turgor intact.  PSYCHIATRIC: Mood, affect within normal limits. The patient is awake, alert and oriented x 3. Insight, judgment intact.  ALL OTHER ROS ARE NEGATIVE         ASSESSMENT / PLAN: 52 year old morbidly obese white male seen today for follow-up severe sleep apnea in the setting of extensive smoke exposure with underlying reactive airways disease in the setting morbid obesity  Patient does not have evidence of obstructive lung disease based on  normal pulmonary function testing Patient with deconditioned state  Patient also has a right upper lobe lung nodule noted on a CT scan in April 2019 7 mm in the right upper lobe He will need further CT scan in 6 months for further assessment due to his underlying moderate to high risk for developing malignancy   Severe OSA Patient does not want CPAP device at this time  he wants to use oral device to treat his sleep apnea I recommend that he get a repeat sleep study with oral device in place to assess sleep apnea  Underlying reactive airways disease likely due to his morbid obese  stop Flovent  use albuterol as needed   Obesity -recommend significant weight loss -recommend changing diet  Deconditioned state -Recommend increased daily activity and exercise   Right upper lobe nodule-moderate/ high risk for malignancy Repeat CT chest in 6 months    Patient   satisfied with Plan of action and management. All questions answered Follow-up in 6 months   Judie Hollick Santiago Glad, M.D.  [facility]  Medical Director [facility] Medical Director [facility]io-P[facility]

## 2018-07-28 NOTE — Patient Instructions (Addendum)
Plan to use oral device and assess progress other wise will need repeat Sleep Study with oral device in place  Albuterol as needed  CT chest in 6 months for lung nodule  Nutrition/Dietary Consult for weight loss

## 2018-08-16 ENCOUNTER — Encounter: Payer: Self-pay | Admitting: Family Medicine

## 2018-08-16 ENCOUNTER — Ambulatory Visit: Payer: BLUE CROSS/BLUE SHIELD | Admitting: Family Medicine

## 2018-08-16 DIAGNOSIS — M25552 Pain in left hip: Secondary | ICD-10-CM | POA: Diagnosis not present

## 2018-08-16 DIAGNOSIS — M25551 Pain in right hip: Secondary | ICD-10-CM | POA: Insufficient documentation

## 2018-08-16 MED ORDER — PREDNISONE 5 MG PO TABS: ORAL_TABLET | ORAL | 0 refills | Status: DC

## 2018-08-16 NOTE — Progress Notes (Signed)
Alex Oneal - 52 y.o. male MRN [id number]  Date of birth: 13-Dec-1965  SUBJECTIVE:  Including CC & ROS.  Chief Complaint  Patient presents with  . Hip Pain    left hip , 8/10 for 1 day    Alex Oneal is a 52 y.o. male that is presenting with acute left lateral hip pain.  The pain started yesterday and has progressed till today.  The pain is severe and localized.  He is experiencing this in the left lateral hip as well as the lateral glutes.  Localized to this area.  No radicular symptoms.  Denies any inciting event.  Has not tried any new or different exercises.  He sits down at work most of the day.  Has not experienced pain like this previously.  Pain is sharp and severe in nature.  Denies any improvement with medications.  This pain kept him up last night..   Review of Systems  Constitutional: Negative for fever.  HENT: Negative for congestion.   Respiratory: Negative for cough.   Cardiovascular: Negative for chest pain.  Gastrointestinal: Negative for abdominal pain.  Musculoskeletal: Positive for gait problem.  Skin: Negative for color change.  Neurological: Negative for tremors.  Hematological: Negative for adenopathy.  Psychiatric/Behavioral: Negative for agitation.    HISTORY: Past Medical, Surgical, Social, and Family History Reviewed & Updated per EMR.   Pertinent Historical Findings include:  Past Medical History:  Diagnosis Date  . Abnormal biliary HIDA scan   . Arthritis   . Asthma    exercise induced  . Former smoker    QUIT 02/2005  . Glucose intolerance (impaired glucose tolerance)   . Gynecomastia, male   . Hypertension   . Obesity   . Shortness of breath dyspnea    with activfity    Past Surgical History:  Procedure Laterality Date  . COLONOSCOPY W/ POLYPECTOMY    . INSERTION OF MESH N/A 10/18/2015   Procedure: INSERTION OF MESH;  Surgeon: Harriette Bouillon, MD;  Location: [facility] OR;  Service: General;  Laterality: N/A;  . TONSILLECTOMY    . VENTRAL HERNIA  REPAIR N/A 10/18/2015   Procedure: HERNIA REPAIR VENTRAL ADULT;  Surgeon: Harriette Bouillon, MD;  Location[facility];  Service: General;  Laterality: N/A;    Allergies  Allergen Reactions  . Fire [employer] Other (See Comments)    Significant local reaction but no lip swelling.    . Vioxx [Rofecoxib] Other (See Comments)    GI upset but not a true allergy    Family History  Problem Relation Age of Onset  . Arthritis Mother   . Heart disease Mother 44  . Cancer Mother        colon, breast, ovarian  . Allergies Mother   . Kidney failure Mother   . Kidney disease Mother        died of renal failure  . Colon cancer Mother   . Arthritis Father   . Hypertension Father   . Allergies Father   . Asthma Father   . Parkinsonism Father        possible environmental causes  . Arthritis Paternal Aunt   . Cancer Paternal Aunt   . Arthritis Paternal Uncle   . Cancer Paternal Uncle   . Arthritis Paternal Grandmother   . Arthritis Paternal Grandfather   . Cancer Paternal Grandfather   . Stroke Paternal Grandfather   . Allergies Brother   . Kidney failure Maternal Grandfather   . Prostate cancer Maternal Grandfather   .  Aneurysm Neg Hx      Social History   Socioeconomic History  . Marital status: Married    Spouse name: [name]  . Number of children: 1  . Years of education: Not on file  . Highest education level: Not on file  Occupational History  . Occupation: office manager/outside Investment banker, corporate: [employer]   Social Needs  . Financial resource strain: Not on file  . Food insecurity:    Worry: Not on file    Inability: Not on file  . Transportation needs:    Medical: Not on file    Non-medical: Not on file  Tobacco Use  . Smoking status: Former Smoker    Packs/day: 2.00    Years: 25.00    Pack years: 50.00    Types: Cigarettes    Last attempt to quit: 02/10/2005    Years since quitting: 13.5  . Smokeless tobacco: Never Used  Substance and Sexual Activity  .  Alcohol use: Yes    Alcohol/week: 2.0 standard drinks    Types: 2 Shots of liquor per week  . Drug use: No    Comment: 2/4?17  . Sexual activity: Yes  Lifestyle  . Physical activity:    Days per week: Not on file    Minutes per session: Not on file  . Stress: Not on file  Relationships  . Social connections:    Talks on phone: Not on file    Gets together: Not on file    Attends religious service: Not on file    Active member of club or organization: Not on file    Attends meetings of clubs or organizations: Not on file    Relationship status: Not on file  . Intimate partner violence:    Fear of current or ex partner: Not on file    Emotionally abused: Not on file    Physically abused: Not on file    Forced sexual activity: Not on file  Other Topics Concern  . Not on file  Social History Narrative   Remarried 1997   Son born 2001   [employer]- office supervisor     PHYSICAL EXAM:  VS: BP (!) 162/108 (BP Location: Left Arm, Patient Position: Sitting, Cuff Size: Normal)   Pulse 83   Temp 98.2 F (36.8 C) (Oral)   Ht 5\' 9"  (1.753 m)   Wt (!) 335 lb 3.2 oz (152 kg)   SpO2 98%   BMI 49.50 kg/m  Physical Exam Gen: NAD, alert, cooperative with exam, well-appearing ENT: normal lips, normal nasal mucosa,  Eye: normal EOM, normal conjunctiva and lids CV:  no edema, +2 pedal pulses   Resp: no accessory muscle use, non-labored,   Skin: no rashes, no areas of induration  Neuro: normal tone, normal sensation to touch Psych:  normal insight, alert and oriented MSK:  Left hip: No significant tenderness to palpation over the greater trochanter piriformis. Significant pain with external rotation.  Minimal pain with internal rotation. Unable to flex at the hip due to significant pain. Positive straight leg raise on the left. Walking with a limp. Neurovascular intact     ASSESSMENT & PLAN:   Left hip pain Pain seems to be associated with greater trochanteric  bursitis but is severe in nature.  Does seem to have pain over the piriformis but no radicular symptoms.  Does not seem to be associated with the joint.  He has a history of back pain but this  feels different -Prednisone. -Counseled on home exercise therapy and supportive care. -Counseled on his posture at work. -If no improvement can consider injection and imaging or physical therapy.

## 2018-08-16 NOTE — Patient Instructions (Signed)
Nice to meet you  Please try the prednisone  Please try heat or ice on the area.  Please try the exercises once the pain has improved.  Please see me back in 1-2 weeks if no better.

## 2018-08-16 NOTE — Assessment & Plan Note (Signed)
Pain seems to be associated with greater trochanteric bursitis but is severe in nature.  Does seem to have pain over the piriformis but no radicular symptoms.  Does not seem to be associated with the joint.  He has a history of back pain but this feels different -Prednisone. -Counseled on home exercise therapy and supportive care. -Counseled on his posture at work. -If no improvement can consider injection and imaging or physical therapy.

## 2018-08-30 ENCOUNTER — Encounter: Payer: BLUE CROSS/BLUE SHIELD | Attending: Internal Medicine | Admitting: Dietician

## 2018-08-30 ENCOUNTER — Encounter: Payer: Self-pay | Admitting: Dietician

## 2018-08-30 NOTE — Patient Instructions (Signed)
Balance meals with protein ( 3-5 oz), 3-4 servings of carbohydrate (45-60 gms) and non-starchy vegetables. Limit added fats (dressing, margarine/butter, sour cream, mayonnaise). Increase intake of non-starchy vegetables. Think of food plate when balancing meals: 1/4 plate protein, 1/4 plate starchy, 1/2 plate non-starchy vegetables and fruits. Decrease alcohol to 2 oz verses 4 oz daily. Slow down when eating. Become more mindful of portions, measuring out some of the starchy foods to become more aware of portions.

## 2018-08-30 NOTE — Progress Notes (Signed)
Medical Nutrition Therapy: Visit start time: 1530      end time:1630 Assessment:  Diagnosis: morbid obesity Past medical history: hypertension, elevated glucose Psychosocial issues/ stress concerns:  None identified    Preferred learning method:  . Auditory . Hands-on  Current weight: 333.9 lbs    Height: 69 in Medications, supplements: see list  Progress and evaluation:  Patient reports a highest weight of 345 lbs in 2000. Reports history of weight loss efforts and gives weight of 299 lbs in 2018. States that weight in the past year has been relatively stable in 320's range. He reports in the past several weeks, he has baked over 200 dozen cookies as Christmas gifts and he typically eats 2 cookies from each batch which has contributed to weight gain in the past few weeks. He does all of the cooking in his home. He likes a variety of vegetables and includes daily. He acknowledges that portion control is a challenge for him. He gives an example of eating 8 oz of chicken as a typical portion of meat or several 8-10 oz bowls of pinto beans with 2 large wedges of cornbread. Approximately 380 calories per day is being contributed by alcohol (tequila shots). He states he drinks this to help him sleep. He states, "I have no motivation to lose weight even though I know it's what I need".   Physical activity: none.   Dietary Intake:  Usual eating pattern includes 3 meals and several snacks per day. Dining out frequency: 3 meals per week.  Breakfast: 10:00- 2 eggs, 2 flour tortillas, 1/8 cup shredded cheese, 16 oz coffee with 2 tsp honey + 1 oz creamer Snack: 30 oz coffee +honey and 2-3 oz creamer; 10 whole wheat crackers, cheese Lunch: 11:30-12:00- chicken breast, starch, green beans or carrots or a variety of vegetables Supper:7-9:00pm-smoked sausage, brussels sprouts, some type of starch Snack:cookies  Beverages: 2-3 cups water, 46 oz coffee, 8-10 oz juice,   Nutrition Care  Education:     Weight control:  Discussed how history of dieting that was overly restrictive can make it difficult now to take steps for positive diet changes. Discussed importance of starting with where he is and begin to make realistic changes. Instructed on a meal plan based on 2000 calories but encouraged to use as a guide to be more mindful of his food choices verses an overly restrictive pattern. Used food guide plate, food models and "Planning a Balance Meal" to show how to identify carbohydrate, protein, non-starchy vegetables and healthy fats. Also, used food plate to show how to better balance these nutrients. Gave and reviewed sample menus as well as showing how to adjust some of his typical meals. Discussed role of meal plan and exercise in helping to decrease risk of diabetes.  Nutritional Diagnosis:  Melvin-3.3 Overweight/obesity As related to large portions, frequent intake of high calorie baked desserts, excessive calories from alcohol, and lack of structured exercise .  As evidenced by diet and exercise history..  Intervention: Balance meals with protein ( 3-5 oz), 3-4 servings of carbohydrate (45-60 gms) and non-starchy vegetables. Limit added fats (dressing, margarine/butter, sour cream, mayonnaise). Increase intake of non-starchy vegetables. Think of food plate when balancing meals: 1/4 plate protein, 1/4 plate starchy, 1/2 plate non-starchy vegetables and fruits. Decrease alcohol to 2 oz verses 4 oz daily. Slow down when eating. Become more mindful of portions, measuring out some of the starchy foods to become more aware of portions.  Education Materials given:  . Plate  Planner . Food lists/ Planning A Balanced Meal . Combination foods/ meals "out" handout . Sample meal pattern/ menus . Goals/ instructions  Learner/ who was taught:  . Patient  Level of understanding: . Partial understanding; needs review/ practice Demonstrated degree of understanding via:   Teach  back Learning barriers: . None  Willingness to learn/ readiness for change: . Hesitance, contemplating change  Monitoring and Evaluation:  Dietary intake, exercise, and body weight      follow up: 11/02/18 at 8:15

## 2018-10-28 ENCOUNTER — Telehealth: Payer: Self-pay | Admitting: Dietician

## 2018-10-28 NOTE — Telephone Encounter (Signed)
Returned voicemail message from patient to cancel his appointment for 11/02/18. He does not wish to reschedule at this time.

## 2018-11-02 ENCOUNTER — Ambulatory Visit: Payer: BLUE CROSS/BLUE SHIELD | Admitting: Dietician

## 2018-11-02 ENCOUNTER — Encounter: Payer: Self-pay | Admitting: Dietician

## 2018-11-11 ENCOUNTER — Encounter: Payer: Self-pay | Admitting: Dietician

## 2019-01-13 ENCOUNTER — Telehealth: Payer: Self-pay | Admitting: Family Medicine

## 2019-01-13 NOTE — Telephone Encounter (Signed)
Patient is due for follow-up study (CT chest), but given pandemic I would defer it for now.  I put a note in the EMR to consider follow-up in June.  If he has questions or concerns about this then let me know.  Thanks.

## 2019-01-26 DIAGNOSIS — J45909 Unspecified asthma, uncomplicated: Secondary | ICD-10-CM | POA: Diagnosis not present

## 2019-01-26 DIAGNOSIS — R918 Other nonspecific abnormal finding of lung field: Secondary | ICD-10-CM | POA: Diagnosis not present

## 2019-02-03 ENCOUNTER — Other Ambulatory Visit: Payer: Self-pay

## 2019-02-03 ENCOUNTER — Encounter: Payer: Self-pay | Admitting: Internal Medicine

## 2019-02-03 ENCOUNTER — Ambulatory Visit (INDEPENDENT_AMBULATORY_CARE_PROVIDER_SITE_OTHER): Payer: BLUE CROSS/BLUE SHIELD | Admitting: Internal Medicine

## 2019-02-03 VITALS — BP 140/82 | HR 81 | Temp 98.0°F | Ht 69.0 in | Wt 322.0 lb

## 2019-02-03 DIAGNOSIS — G4733 Obstructive sleep apnea (adult) (pediatric): Secondary | ICD-10-CM | POA: Diagnosis not present

## 2019-02-03 DIAGNOSIS — J40 Bronchitis, not specified as acute or chronic: Secondary | ICD-10-CM | POA: Diagnosis not present

## 2019-02-03 MED ORDER — PREDNISONE 20 MG PO TABS: 20.0000 mg | ORAL_TABLET | Freq: Every day | ORAL | 0 refills | Status: DC

## 2019-02-03 NOTE — Patient Instructions (Addendum)
Prednisone 20 mg daily for 10 days  Recommend starting CPAP therapy

## 2019-02-03 NOTE — Progress Notes (Signed)
Name: Marveen ReeksJames E Tiu MRN: [id number] DOB: 11/24/1965     CONSULTATION DATE: 8.29.19 REFERRING MD : Para Marchduncan    STUDIES:     CXR independently reviewed by Me  06/12/17  CT chest Independently reviewed by Me today largest of which within the right upper lobe measures 7 mm in diameter.  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  07/2018 PFTs ratio is 73% predicted FEV1 is 78% predicted FVC is 83% predicted Interpretation-no obvious evidence of obstructive or restrictive lung disease  06/27/2018 Sleep study  AHI of 51 consistent with severe sleep apnea patient has not started on CPAP therapy He plans on using oral device to assess his sleep apnea  April 2019 CT of the chest was obtained in April 2019 I am unable to open the file He is noted to have a 7 mm right upper lobe nodule    CC Follow-up shortness of breath follow-up sleep apnea Acute bronchitis  HPI Patient with acute bronchitis Acute cough over the last couple of days Productive of thick yellow mucus No fevers no chills  Patient has chronic cough Former smoker Has been using albuterol as needed which has been helping  I stopped inhaled steroids at last visit due to the fact he does not have any obstructive underlying airways disease Patient has intermittent reactive airways disease that is responding to albuterol therapy   No fevers at this time     PAST MEDICAL HISTORY :   has a past medical history of Abnormal biliary HIDA scan, Arthritis, Asthma, Former smoker, Glucose intolerance (impaired glucose tolerance), Gynecomastia, male, Hypertension, Obesity, and Shortness of breath dyspnea.  has a past surgical history that includes Tonsillectomy; Colonoscopy w/ polypectomy; Ventral hernia repair (N/A, 10/18/2015); and Insertion of mesh (N/A, 10/18/2015). Prior to Admission  medications   Medication Sig Start Date End Date Taking? Authorizing Provider  albuterol (PROVENTIL HFA;VENTOLIN HFA) 108 (90 Base) MCG/ACT inhaler Inhale 2 puffs into the lungs every 6 (six) hours as needed for wheezing or shortness of breath. Hold for patient to request later. 05/04/18  Yes Joaquim Namuncan, Graham S, MD  fluticasone (FLOVENT HFA) 110 MCG/ACT inhaler Inhale 1 puff into the lungs 2 (two) times daily. Rinse after use 12/11/17  Yes Joaquim Namuncan, Graham S, MD  losartan (COZAAR) 100 MG tablet Take 0.5-1 tablets (50-100 mg total) by mouth daily. 05/04/18  Yes Joaquim Namuncan, Graham S, MD   Allergies  Allergen Reactions  . Fire [employer] Other (See Comments)    Significant local reaction but no lip swelling.    . Vioxx [Rofecoxib] Other (See Comments)    GI upset but not a true allergy       Review of Systems:  Gen:  Denies  fever, sweats, chills weigh loss  HEENT: Denies blurred vision, double vision, ear pain, eye pain, hearing loss, nose bleeds, sore throat Cardiac:  No dizziness, chest pain or heaviness, chest tightness,edema, No JVD Resp:   + Cough, + sputum production, + shortness of breath, sign wheezing, -hemoptysis,  Gi: Denies swallowing difficulty, stomach pain, nausea or vomiting, diarrhea, constipation, bowel incontinence Gu:  Denies bladder incontinence, burning urine Ext:   Denies Joint pain, stiffness or swelling Skin: Denies  skin rash, easy bruising or bleeding or hives Endoc:  Denies polyuria, polydipsia , polyphagia or weight change Psych:   Denies depression,  insomnia or hallucinations  Other:  All other systems negative    BP 140/82 (BP Location: Left Arm, Cuff Size: Normal)   Pulse 81   Temp 98 F (36.7 C) (Oral)   Ht 5\' 9"  (1.753 m)   Wt (!) 322 lb (146.1 kg)   SpO2 98%   BMI 47.55 kg/m   Physical Examination:   GENERAL:NAD, no fevers, chills, no weakness no fatigue HEAD: Normocephalic, atraumatic.  EYES: PERLA, EOMI No scleral icterus.  MOUTH: Moist mucosal  membrane.  EAR, NOSE, THROAT: Clear without exudates. No external lesions.  NECK: Supple. No thyromegaly.  No JVD.  PULMONARY: CTA B/L no wheezing, rhonchi, crackles CARDIOVASCULAR: S1 and S2. Regular rate and rhythm. No murmurs GASTROINTESTINAL: Soft, nontender, nondistended. Positive bowel sounds.  MUSCULOSKELETAL: No swelling, clubbing, or edema.  NEUROLOGIC: No gross focal neurological deficits. 5/5 strength all extremities SKIN: No ulceration, lesions, rashes, or cyanosis.  PSYCHIATRIC: Insight, judgment intact. -depression -anxiety ALL OTHER ROS ARE NEGATIVE           ASSESSMENT / PLAN: 53 year old morbidly obese white male seen today for signs and symptoms of acute bronchitis in the setting of severe sleep apnea in the setting of extensive smoking history and exposure with underlying reactive airways disease in the setting of morbid obesity and deconditioned state  Acute on chronic bronchitis Prednisone 20 mg daily for 10 days Albuterol 2 to 4 puffs every 4 hours as needed If symptoms worsen will prescribe with Z-Pak  Severe OSA Patient refuses to wear CPAP device at this time Follow-up sleep study with oral device in place We will discuss at next office visit   Underlying intermittent reactive airways disease most likely due to his morbid obesity Albuterol as needed Stop Flovent   Right upper lobe nodule-moderate/ high risk for malignancy Previous CAT scan  right upper lobe lung nodule noted on a CT scan in April 2019 7 mm in the right upper lobe -CT of the chest from outside hospital reviewed obtained on Jan 26, 2019 I do not see any evidence of lung masses or significant nodules at this time Right upper lobe subcentimeter nodule approx 7 MM which is unchanged for last 2 years I recommend No further CT scans at this time   Obesity -recommend significant weight loss -recommend changing diet  Deconditioned state -Recommend increased daily activity and  exercise    COVID-19 EDUCATION: The signs and symptoms of COVID-19 were discussed with the patient and how to seek care for testing.  The importance of social distancing was discussed today. Hand Washing Techniques and avoid touching face was advised.  MEDICATION ADJUSTMENTS/LABS AND TESTS ORDERED: Prednisone 20 mg daily for 10 days  CURRENT MEDICATIONS REVIEWED AT LENGTH WITH PATIENT TODAY   Patient satisfied with Plan of action and management. All questions answered Follow up in 6 months   Cannon Arreola Santiago Glad, M.D.  [facility]  Medical Director [facility] Medical Director [facility] Cardio-Pulmonary Department

## 2019-02-10 ENCOUNTER — Telehealth: Payer: Self-pay | Admitting: Internal Medicine

## 2019-02-10 NOTE — Telephone Encounter (Signed)
Called and spoke to pt, who stated he brought CT on disc to last OV.  Pt is requesting these results.  Disc can be located on DK's desk.  Dr. Belia Heman please advise. Thanks

## 2019-02-10 NOTE — Telephone Encounter (Signed)
Pt is aware of results and voiced his understanding. Nothing further is needed.  

## 2019-02-10 NOTE — Telephone Encounter (Signed)
No changes in CT scans No concerns at this time

## 2019-02-14 ENCOUNTER — Telehealth: Payer: Self-pay

## 2019-02-14 NOTE — Telephone Encounter (Signed)
Pt left v/m wanting to know pts blood type. I spoke with pt and do not see blood type listed in pts lab result tab. Pt said he will wait until time for physical and if still wants to know about blood type will mention to Dr Damita Dunnings. Advised pt if a person gives blood they would be able to tell him what type blood type he is.pt appreciated info and will discuss with Dr Damita Dunnings at annual exam.nothing further needed at this time. FYI to Dr Damita Dunnings.

## 2019-02-15 NOTE — Telephone Encounter (Signed)
Noted. Thanks.

## 2019-02-20 ENCOUNTER — Other Ambulatory Visit: Payer: Self-pay | Admitting: Family Medicine

## 2019-02-20 DIAGNOSIS — R918 Other nonspecific abnormal finding of lung field: Secondary | ICD-10-CM

## 2019-02-20 NOTE — Progress Notes (Unsigned)
Patient is due for f/u CT chest re: nodules.  Ordered.  Thanks.

## 2019-03-01 ENCOUNTER — Ambulatory Visit (INDEPENDENT_AMBULATORY_CARE_PROVIDER_SITE_OTHER): Payer: BC Managed Care – PPO | Admitting: Family Medicine

## 2019-03-01 ENCOUNTER — Other Ambulatory Visit: Payer: Self-pay

## 2019-03-01 DIAGNOSIS — J40 Bronchitis, not specified as acute or chronic: Secondary | ICD-10-CM

## 2019-03-01 DIAGNOSIS — J45909 Unspecified asthma, uncomplicated: Secondary | ICD-10-CM | POA: Diagnosis not present

## 2019-03-01 DIAGNOSIS — R0683 Snoring: Secondary | ICD-10-CM

## 2019-03-01 MED ORDER — PREDNISONE 20 MG PO TABS: ORAL_TABLET | ORAL | 0 refills | Status: DC

## 2019-03-01 NOTE — Progress Notes (Signed)
Virtual visit completed through WebEx or similar program Patient location: home  Provider location: [place] at [facility], office   Pandemic considerations d/w pt.   Limitations and rationale for visit method d/w patient.  Patient agreed to proceed.   CC:  f/u.   HPI:  Patient has h/o intermittent reactive airways disease.    He had f/u CT chest.  He has seen pulmonary in the meantime.  He had to use SABA frequently in the last month.  Sx worse at night, chest is heavy when he has sx.  SABA helps.  He couldn't tel a change with flovent prev, on or off med. He feels worse in hot humid air.    He doesn't feel like he needs to be at ER today.  He has some wheeze with inhale and exhale.  Only minimal sputum recently, clear.  No fevers.    Prednisone seemed to help some prev- he had less SABA use/need.   OSA d/w pt.  He does have oral appliance, but he can't keep it in his mouth overnight. When used, wife notices a change/improvement.   We talked about starting CPAP- I will update pulmonary.  He is willing start.    Meds and allergies reviewed.   ROS: Per HPI unless specifically indicated in ROS section   NAD Speech wnl Speaking in complete sentences.    A/P: Increased need for albuterol recently.  At this point he is speaking in complete sentences and does not appear to be any in any distress.  Discussed options. Advised to go to ER or dial 911 if worse in the meantime.   Restart pred taper, use SABA prn.  I'll update pulmonary about his recent situation and OSA.  He is now willing to start CPAP for sleep apnea.  I will ask for pulmonary input on orders/etc.

## 2019-03-03 DIAGNOSIS — J45909 Unspecified asthma, uncomplicated: Secondary | ICD-10-CM | POA: Insufficient documentation

## 2019-03-03 NOTE — Assessment & Plan Note (Signed)
See above

## 2019-03-03 NOTE — Assessment & Plan Note (Signed)
See above regarding obstructive sleep apnea.

## 2019-03-23 ENCOUNTER — Ambulatory Visit (INDEPENDENT_AMBULATORY_CARE_PROVIDER_SITE_OTHER): Payer: BC Managed Care – PPO | Admitting: Family Medicine

## 2019-03-23 ENCOUNTER — Encounter: Payer: Self-pay | Admitting: Family Medicine

## 2019-03-23 VITALS — Ht 69.0 in | Wt 325.0 lb

## 2019-03-23 DIAGNOSIS — J45909 Unspecified asthma, uncomplicated: Secondary | ICD-10-CM

## 2019-03-23 DIAGNOSIS — G4733 Obstructive sleep apnea (adult) (pediatric): Secondary | ICD-10-CM | POA: Diagnosis not present

## 2019-03-23 DIAGNOSIS — J4530 Mild persistent asthma, uncomplicated: Secondary | ICD-10-CM | POA: Diagnosis not present

## 2019-03-23 DIAGNOSIS — R0600 Dyspnea, unspecified: Secondary | ICD-10-CM

## 2019-03-23 MED ORDER — PULMICORT FLEXHALER 180 MCG/ACT IN AEPB: 1.0000 | INHALATION_SPRAY | Freq: Two times a day (BID) | RESPIRATORY_TRACT | 1 refills | Status: DC

## 2019-03-23 MED ORDER — ALBUTEROL SULFATE HFA 108 (90 BASE) MCG/ACT IN AERS: 2.0000 | INHALATION_SPRAY | Freq: Four times a day (QID) | RESPIRATORY_TRACT | 3 refills | Status: DC | PRN

## 2019-03-23 MED ORDER — PREDNISONE 20 MG PO TABS: ORAL_TABLET | ORAL | 0 refills | Status: DC

## 2019-03-23 NOTE — Assessment & Plan Note (Addendum)
Pt agrees to try CPAP - will refer back to pulm ([facility]).

## 2019-03-23 NOTE — Assessment & Plan Note (Addendum)
Increased nocturnal dyspnea over last several months. Sounds like reactive airway flares (associated with wheezing, responsive to albuterol inhaler and prednisone course). Denies leg swelling, doubt CHF related. Treat with prednisone taper then start pulmicort inhaler trial. refilled albuterol PRN.

## 2019-03-23 NOTE — Progress Notes (Signed)
Virtual visit completed through D[employer]. Due to national recommendations of social distancing due to COVID-19, a virtual visit is felt to be most appropriate for this patient at this time. Reviewed limitations of a virtual visit.   Patient location: home Provider location: [place] [facility], office If any vitals were documented, they were collected by patient at home unless specified below.    Ht 5\' 9"  (1.753 m)   Wt (!) 325 lb (147.4 kg)   BMI 47.99 kg/m    CC: asthma Subjective:    Patient ID: Alex Oneal, male    DOB: 10/08/1965, 53 y.o.   MRN: [id number]  HPI: Alex ReeksJames E Delucia is a 53 y.o. male presenting on 03/23/2019 for Asthma (Says he saw pulmonology in early June due to SOB at night.  Prescribed prednisone, helpful but sxs returned.  Seen by PCP 03/01/19, prescribed prednisone again.  Pt was fine for awhile and same sxs has come back, SOB at night. )   01/2019 - started noticing increased need for rescue SABA inhaler, worse at night time. During the day doesn't note significant respiratory restrictions or concerns. Waking up at night with dyspnea, wheezing and chest tightness - has to sit up and go get his albuterol inhaler - with good benefit. Episodes can happen 3 times a night.   Occasional non-productive cough, no fevers/chills, denies significant leg swelling. Sleeps with 1 pillow, on his side, no orthopnea.  Some itchy watery eyes, attributes to weed pollen. He takes 2 teaspoons of local honey every day with benefit to help control allergic rhinitis symptoms. No significant nasal congestion.  No known sick contacts at home.   Ex smoker - quit 14 yrs ago.   Acute on chronic bronchitis treated by pulm ([facility]) with prednisone course and albuterol inhaler PRN (01/2019), then again by PCP with 10d prednisone taper (03/01/2019).   Flovent was stopped by pulm due to lack of underlying obstructive airway disease. H/o intermittent reactive airway disease previously attributed to  obesity. He didn't feel much better while on flovent, but did not improvement while taking asmanex - states this is no longer manufactured.   Home sleep study 06/2018 - severe OSA with AHI of 52, rec auto-CPAP - previously refused CPAP but at last visit with PCP had agreed to start this. Has not started yet.   Known pulmonary nodules - overall stable CT recently.      Relevant past medical, surgical, family and social history reviewed and updated as indicated. Interim medical history since our last visit reviewed. Allergies and medications reviewed and updated. Outpatient Medications Prior to Visit  Medication Sig Dispense Refill  . losartan (COZAAR) 100 MG tablet Take 0.5-1 tablets (50-100 mg total) by mouth daily. 90 tablet 3  . albuterol (PROVENTIL HFA;VENTOLIN HFA) 108 (90 Base) MCG/ACT inhaler Inhale 2 puffs into the lungs every 6 (six) hours as needed for wheezing or shortness of breath. Hold for patient to request later. 1 Inhaler 1  . predniSONE (DELTASONE) 20 MG tablet Take 2 a day for 5 days, then 1 a day for 5 days, with food. Don't take with aleve/ibuprofen. 15 tablet 0   No facility-administered medications prior to visit.      Per HPI unless specifically indicated in ROS section below Review of Systems Objective:    Ht 5\' 9"  (1.753 m)   Wt (!) 325 lb (147.4 kg)   BMI 47.99 kg/m   Wt Readings from Last 3 Encounters:  03/23/19 (!) 325 lb (  147.4 kg)  02/03/19 (!) 322 lb (146.1 kg)  08/30/18 (!) 333 lb 14.4 oz (151.5 kg)     Physical exam: Gen: alert, NAD, not ill appearing Pulm: speaks in complete sentences without increased work of breathing. Psych: normal mood, normal thought content      Assessment & Plan:   Problem List Items Addressed This Visit    Reactive airway disease   PND (paroxysmal nocturnal dyspnea) - Primary    Increased nocturnal dyspnea over last several months. Sounds like reactive airway flares (associated with wheezing, responsive to albuterol  inhaler and prednisone course). Denies leg swelling, doubt CHF related. Treat with prednisone taper then start pulmicort inhaler trial. refilled albuterol PRN.       Relevant Orders   Ambulatory referral to Pulmonology   OSA (obstructive sleep apnea)    Pt agrees to try CPAP - will refer back to pulm ([facility]).       Relevant Orders   Ambulatory referral to Pulmonology   Asthma, mild persistent    See above. Will trial pulmicort inhaler BID as well as refer back to pulm for further discussion on CPAP commencement. No significant allergic rhinitis symptoms.       Relevant Medications   albuterol (VENTOLIN HFA) 108 (90 Base) MCG/ACT inhaler   budesonide (PULMICORT FLEXHALER) 180 MCG/ACT inhaler   predniSONE (DELTASONE) 20 MG tablet       Meds ordered this encounter  Medications  . albuterol (VENTOLIN HFA) 108 (90 Base) MCG/ACT inhaler    Sig: Inhale 2 puffs into the lungs every 6 (six) hours as needed for wheezing or shortness of breath. Hold for patient to request later.    Dispense:  18 g    Refill:  3  . budesonide (PULMICORT FLEXHALER) 180 MCG/ACT inhaler    Sig: Inhale 1 puff into the lungs 2 (two) times a day.    Dispense:  1 each    Refill:  1  . predniSONE (DELTASONE) 20 MG tablet    Sig: Take two tablets daily for 3 days followed by one tablet daily for 4 days    Dispense:  10 tablet    Refill:  0   Orders Placed This Encounter  Procedures  . Ambulatory referral to Pulmonology    Referral Priority:   Routine    Referral Type:   Consultation    Referral Reason:   Specialty Services Required    Requested Specialty:   Pulmonary Disease    Number of Visits Requested:   1    I discussed the assessment and treatment plan with the patient. The patient was provided an opportunity to ask questions and all were answered. The patient agreed with the plan and demonstrated an understanding of the instructions. The patient was advised to call back or seek an in-person evaluation  if the symptoms worsen or if the condition fails to improve as anticipated.  Follow up plan: Return if symptoms worsen or fail to improve.  Ria Bush, MD

## 2019-03-23 NOTE — Assessment & Plan Note (Signed)
See above. Will trial pulmicort inhaler BID as well as refer back to pulm for further discussion on CPAP commencement. No significant allergic rhinitis symptoms.

## 2019-04-01 ENCOUNTER — Telehealth: Payer: Self-pay | Admitting: Internal Medicine

## 2019-04-01 DIAGNOSIS — G4733 Obstructive sleep apnea (adult) (pediatric): Secondary | ICD-10-CM

## 2019-04-01 NOTE — Telephone Encounter (Signed)
Got a call from [place] at [facility] stating that a referral was put in for this pt to get a CPAP but she noticed that pt is already established with Dr. Mortimer Fries.  Wants to know does he need to schedule a follow-up to receive CPAP or can we assist him with the process without an appt.  Please follow up with pt in reference to this.

## 2019-04-01 NOTE — Telephone Encounter (Signed)
Per Rosaria Ferries at [facility] pt expressed a desire to start cpap at his last appointment with them. ATC pt without success. LMOM for pt to call for appointment at his convenience.

## 2019-04-04 NOTE — Telephone Encounter (Signed)
Pt is calling back [phone]

## 2019-04-04 NOTE — Telephone Encounter (Signed)
Pt returned call and stated that he is ready to start cpap as previously recommended. Pt states that he had purchased an oral device on his own that is working. Advised pt that I would put the order in for auto-cpap with a range of 5-20cm H2O since his sleep study was done less than a year ago. Pt aware that he will need to be seen within 31-90 days of starting cpap for compliance.Pt will call for appt. Once he starts using cpap. No further actions necessary at this time.

## 2019-04-07 DIAGNOSIS — H40013 Open angle with borderline findings, low risk, bilateral: Secondary | ICD-10-CM | POA: Diagnosis not present

## 2019-04-21 DIAGNOSIS — G4733 Obstructive sleep apnea (adult) (pediatric): Secondary | ICD-10-CM | POA: Diagnosis not present

## 2019-05-22 DIAGNOSIS — G4733 Obstructive sleep apnea (adult) (pediatric): Secondary | ICD-10-CM | POA: Diagnosis not present

## 2019-05-30 DIAGNOSIS — H40013 Open angle with borderline findings, low risk, bilateral: Secondary | ICD-10-CM | POA: Diagnosis not present

## 2019-06-02 ENCOUNTER — Ambulatory Visit: Payer: BC Managed Care – PPO | Admitting: Internal Medicine

## 2019-06-03 ENCOUNTER — Other Ambulatory Visit: Payer: Self-pay

## 2019-06-03 ENCOUNTER — Ambulatory Visit (INDEPENDENT_AMBULATORY_CARE_PROVIDER_SITE_OTHER): Payer: BC Managed Care – PPO | Admitting: Internal Medicine

## 2019-06-03 ENCOUNTER — Encounter: Payer: Self-pay | Admitting: Internal Medicine

## 2019-06-03 DIAGNOSIS — G4733 Obstructive sleep apnea (adult) (pediatric): Secondary | ICD-10-CM

## 2019-06-03 DIAGNOSIS — J4520 Mild intermittent asthma, uncomplicated: Secondary | ICD-10-CM | POA: Diagnosis not present

## 2019-06-03 NOTE — Progress Notes (Signed)
Name: Alex Oneal MRN: [id number] DOB: 14-Mar-1966       I connected with the patient by telephone enabled telemedicine visit and verified that I am speaking with the correct person using two identifiers.    I discussed the limitations, risks, security and privacy concerns of performing an evaluation and management service by telemedicine and the availability of in-person appointments. I also discussed with the patient that there may be a patient responsible charge related to this service. The patient expressed understanding and agreed to proceed.  PATIENT AGREES AND CONFIRMS -YES   Other persons participating in the visit and their role in the encounter: Patient, nursing  This visit type was conducted due to national recommendations for restrictions regarding the COVID-19 Pandemic (e.g. social distancing).  This format is felt to be most appropriate for this patient at this time.  All issues noted in this document were discussed and addressed.       CONSULTATION DATE: 06/03/2019  REFERRING MD : Para March    STUDIES:     CXR independently reviewed by Me  06/12/17  CT chest Independently reviewed by Me today largest of which within the right upper lobe measures 7 mm in diameter.  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  07/2018 PFTs ratio is 73% predicted FEV1 is 78% predicted FVC is 83% predicted Interpretation-no obvious evidence of obstructive or restrictive lung disease  06/27/2018 Sleep study  AHI of 51 consistent with severe sleep apnea patient has not started on CPAP therapy He plans on using oral device to assess his sleep apnea  April 2019 CT of the chest was obtained in April 2019 I am unable to open the file He is noted to have a 7 mm right upper lobe nodule    CC Follow up SOB Follow up OSA   HPI Acute bronchitis  resolved with prednisone at last OV Former smoker Started Pulmicort  Started using CPAP Full face mask Still adjusting to the whole therapy   No evidence of heart failure at this time No evidence or signs of infection at this time No respiratory distress No fevers, chills, nausea, vomiting, diarrhea No evidence of lower extremity edema No evidence hemoptysis   PAST MEDICAL HISTORY :   has a past medical history of Abnormal biliary HIDA scan, Arthritis, Asthma, Former smoker, Glucose intolerance (impaired glucose tolerance), Gynecomastia, male, Hypertension, Obesity, and Shortness of breath dyspnea.  has a past surgical history that includes Tonsillectomy; Colonoscopy w/ polypectomy; Ventral hernia repair (N/A, 10/18/2015); and Insertion of mesh (N/A, 10/18/2015). Prior to Admission medications   Medication Sig Start Date End Date Taking? Authorizing Provider  albuterol (PROVENTIL HFA;VENTOLIN HFA) 108 (90 Base) MCG/ACT inhaler Inhale 2 puffs into the lungs every 6 (six) hours as needed for wheezing or shortness of breath. Hold for patient to request later. 05/04/18  Yes [name], MD  fluticasone (FLOVENT HFA) 110 MCG/ACT inhaler Inhale 1 puff into the lungs 2 (two) times daily. Rinse after use 12/11/17  Yes [name], MD  losartan (COZAAR) 100 MG tablet Take 0.5-1 tablets (50-100 mg total) by mouth daily. 05/04/18  Yes [name], MD   Allergies  Allergen Reactions  . Fire [employer] Other (See Comments)    Significant local reaction but no lip swelling.    . Vioxx [Rofecoxib]  Other (See Comments)    GI upset but not a true allergy     Review of Systems:  Gen:  Denies  fever, sweats, chills weight loss  HEENT: Denies blurred vision, double vision, ear pain, eye pain, hearing loss, nose bleeds, sore throat Cardiac:  No dizziness, chest pain or heaviness, chest tightness,edema, No JVD Resp:   No cough, -sputum production, -shortness of breath,-wheezing, -hemoptysis,  Gi:  Denies swallowing difficulty, stomach pain, nausea or vomiting, diarrhea, constipation, bowel incontinence Gu:  Denies bladder incontinence, burning urine Ext:   Denies Joint pain, stiffness or swelling Skin: Denies  skin rash, easy bruising or bleeding or hives Endoc:  Denies polyuria, polydipsia , polyphagia or weight change Psych:   Denies depression, insomnia or hallucinations  Other:  All other systems negative        ASSESSMENT / PLAN: severe sleep apnea in the setting of extensive smoking history and exposure with underlying reactive airways disease in the setting of morbid obesity and deconditioned state   Severe OSA AHI down to 3 according to patient Patient started to USE CPAP Patient using and benefit from CPAP therapy  Underlying reactive airways disease from obesity Started on pulmicort and working well Albuterol as needed  abnormal CT chest right upper lobe lung nodule noted on a CT scan in April 2019 7 mm in the right upper lobe -CT of the chest from outside hospital reviewed obtained on Jan 26, 2019 I do not see any evidence of lung masses or significant nodules at this time Right upper lobe subcentimeter nodule approx 7 MM which is unchanged for last 2 years I recommend No further CT scans at this time  Obesity -recommend significant weight loss -recommend changing diet  Deconditioned state -Recommend increased daily activity and exercise  I recommend follow up PCP for assessment for fatigue  COVID-19 EDUCATION: The signs and symptoms of COVID-19 were discussed with the patient and how to seek care for testing.  The importance of social distancing was discussed today. Hand Washing Techniques and avoid touching face was advised.  MEDICATION ADJUSTMENTS/LABS AND TESTS ORDERED: Continue Pulmicort BID continue CPAP as prescribed  CURRENT MEDICATIONS REVIEWED AT LENGTH WITH PATIENT TODAY   Patient satisfied with Plan of action and management. All  questions answered Follow up in 6 months  Total Time spent 23 mins   Ademide Schaberg Patricia Pesa, M.D.  [facility]  Medical Director [place] Director [facility] Cardio-Pulmonary Department

## 2019-06-03 NOTE — Patient Instructions (Signed)
Continue Pulmicort BID continue CPAP as prescribed

## 2019-06-09 ENCOUNTER — Telehealth: Payer: Self-pay | Admitting: Family Medicine

## 2019-06-09 DIAGNOSIS — Z125 Encounter for screening for malignant neoplasm of prostate: Secondary | ICD-10-CM

## 2019-06-09 DIAGNOSIS — R5383 Other fatigue: Secondary | ICD-10-CM

## 2019-06-09 DIAGNOSIS — I10 Essential (primary) hypertension: Secondary | ICD-10-CM

## 2019-06-09 NOTE — Telephone Encounter (Signed)
Patient stated that his cardiologist would like him to have his Testerone and thyroid checked when he has his labs drawn on 11/5.  They would like to know if you are able to order this for him

## 2019-06-12 NOTE — Telephone Encounter (Signed)
Which doc recommended that and can we get those notes? What is the reason to check those labs?  I need a dx code. Even if his testosterone is low, I likely wouldn't recommend him taking testosterone.

## 2019-06-13 NOTE — Telephone Encounter (Signed)
Thanks.  I'll ask for input from Dr. Mortimer Fries.   Dr. Mortimer Fries- patient was asking about getting testosterone and TSH checked given his fatigue.  If we get labs done here, do you have any specific requests?  I am trying to make sure we address concerns that either you or the patient had.   Many thanks.

## 2019-06-13 NOTE — Telephone Encounter (Signed)
I understood the patient to say Dr. Mortimer Fries at [facility] whom he has recently seen, but this is Pulmonary, not Cardiology.  I don't see anything in that note indicating that these labs need to be done.  However, the patient says that because his CPAP is not helping his feeling so tired and fatigued, the MD wanted to check his thyroid and testosterone.

## 2019-06-14 NOTE — Telephone Encounter (Signed)
Hello. He needs further work up for fatigue, from what I can gather his fatigue is not related to sleep disorder

## 2019-06-15 NOTE — Addendum Note (Signed)
Addended by: Tonia Ghent on: 06/15/2019 02:00 PM   Modules accepted: Orders

## 2019-06-15 NOTE — Addendum Note (Signed)
Addended by: Ellamae Sia on: 06/15/2019 02:18 PM   Modules accepted: Orders

## 2019-06-15 NOTE — Telephone Encounter (Signed)
I put in all of the follow-up orders.  Thanks.  Please notify patient.

## 2019-06-16 ENCOUNTER — Ambulatory Visit (INDEPENDENT_AMBULATORY_CARE_PROVIDER_SITE_OTHER): Payer: BC Managed Care – PPO

## 2019-06-16 ENCOUNTER — Other Ambulatory Visit: Payer: Self-pay | Admitting: Family Medicine

## 2019-06-16 ENCOUNTER — Other Ambulatory Visit: Payer: Self-pay

## 2019-06-16 DIAGNOSIS — Z23 Encounter for immunization: Secondary | ICD-10-CM | POA: Diagnosis not present

## 2019-06-17 NOTE — Telephone Encounter (Signed)
Patient notified

## 2019-06-21 DIAGNOSIS — G4733 Obstructive sleep apnea (adult) (pediatric): Secondary | ICD-10-CM | POA: Diagnosis not present

## 2019-06-27 ENCOUNTER — Other Ambulatory Visit: Payer: Self-pay

## 2019-06-27 ENCOUNTER — Ambulatory Visit (INDEPENDENT_AMBULATORY_CARE_PROVIDER_SITE_OTHER): Payer: BC Managed Care – PPO | Admitting: Podiatry

## 2019-06-27 ENCOUNTER — Ambulatory Visit (INDEPENDENT_AMBULATORY_CARE_PROVIDER_SITE_OTHER): Payer: BC Managed Care – PPO

## 2019-06-27 ENCOUNTER — Encounter: Payer: Self-pay | Admitting: Podiatry

## 2019-06-27 VITALS — BP 146/93 | HR 78

## 2019-06-27 DIAGNOSIS — M722 Plantar fascial fibromatosis: Secondary | ICD-10-CM

## 2019-06-27 NOTE — Progress Notes (Signed)
This patient presents the office with chief complaint of pain through the arch on his left foot.  Patient states that the pain has been present for approximately 8 weeks and he has sought no self treatment nor sought any professional help.  He denies any history of trauma or injury to the foot.  He says the pain worsens after a day's activity.  He presents the office today for an evaluation and treatment of his painful left foot.  Vascular  Dorsalis pedis and posterior tibial pulses are palpable  B/L.  Capillary return  WNL.  Temperature gradient is  WNL.  Skin turgor  WNL  Sensorium  Senn Weinstein monofilament wire  WNL. Normal tactile sensation.  Nail Exam  Patient has normal nails with no evidence of bacterial or fungal infection.  Orthopedic  Exam  Muscle tone and muscle strength  WNL.  No limitations of motion feet  B/L.  No crepitus or joint effusion noted.  Foot type is unremarkable and digits show no abnormalities.  Bony prominences are unremarkable.  Pain noted along the course of the plantar  fascia of the left foot.  Pes planus  B/L.  Skin  No open lesions.  Normal skin texture and turgor.  Plantar fasciitis left foot.  IE.  X-rays revealed no evidence of bony pathology.  Discussed condition with patient.  Told him we should dispensed power step insoles for him to wear in his shoes.  He also is hesitant on taking medication since he is scheduled for colonoscopy soon.  Patient to return to the office in 4 weeks if the problem persists.  Gardiner Barefoot DPM

## 2019-07-07 DIAGNOSIS — D123 Benign neoplasm of transverse colon: Secondary | ICD-10-CM | POA: Diagnosis not present

## 2019-07-07 DIAGNOSIS — K648 Other hemorrhoids: Secondary | ICD-10-CM | POA: Diagnosis not present

## 2019-07-07 DIAGNOSIS — D128 Benign neoplasm of rectum: Secondary | ICD-10-CM | POA: Diagnosis not present

## 2019-07-07 DIAGNOSIS — Z1211 Encounter for screening for malignant neoplasm of colon: Secondary | ICD-10-CM | POA: Diagnosis not present

## 2019-07-07 DIAGNOSIS — K5730 Diverticulosis of large intestine without perforation or abscess without bleeding: Secondary | ICD-10-CM | POA: Diagnosis not present

## 2019-07-07 DIAGNOSIS — K635 Polyp of colon: Secondary | ICD-10-CM | POA: Diagnosis not present

## 2019-07-07 DIAGNOSIS — K621 Rectal polyp: Secondary | ICD-10-CM | POA: Diagnosis not present

## 2019-07-07 DIAGNOSIS — Z800 Family history of malignant neoplasm of digestive organs: Secondary | ICD-10-CM | POA: Diagnosis not present

## 2019-07-07 LAB — HM COLONOSCOPY

## 2019-07-14 ENCOUNTER — Other Ambulatory Visit (INDEPENDENT_AMBULATORY_CARE_PROVIDER_SITE_OTHER): Payer: BC Managed Care – PPO

## 2019-07-14 ENCOUNTER — Other Ambulatory Visit: Payer: Self-pay

## 2019-07-14 DIAGNOSIS — R5383 Other fatigue: Secondary | ICD-10-CM | POA: Diagnosis not present

## 2019-07-14 DIAGNOSIS — Z125 Encounter for screening for malignant neoplasm of prostate: Secondary | ICD-10-CM | POA: Diagnosis not present

## 2019-07-14 DIAGNOSIS — I10 Essential (primary) hypertension: Secondary | ICD-10-CM | POA: Diagnosis not present

## 2019-07-14 LAB — CBC WITH DIFFERENTIAL/PLATELET
Basophils Absolute: 0.1 10*3/uL (ref 0.0–0.1)
Basophils Relative: 0.6 % (ref 0.0–3.0)
Eosinophils Absolute: 0.3 10*3/uL (ref 0.0–0.7)
Eosinophils Relative: 2.4 % (ref 0.0–5.0)
HCT: 47.5 % (ref 39.0–52.0)
Hemoglobin: 15.7 g/dL (ref 13.0–17.0)
Lymphocytes Relative: 23.8 % (ref 12.0–46.0)
Lymphs Abs: 2.5 10*3/uL (ref 0.7–4.0)
MCHC: 33 g/dL (ref 30.0–36.0)
MCV: 92.5 fl (ref 78.0–100.0)
Monocytes Absolute: 1.2 10*3/uL — ABNORMAL HIGH (ref 0.1–1.0)
Monocytes Relative: 11.6 % (ref 3.0–12.0)
Neutro Abs: 6.6 10*3/uL (ref 1.4–7.7)
Neutrophils Relative %: 61.6 % (ref 43.0–77.0)
Platelets: 293 10*3/uL (ref 150.0–400.0)
RBC: 5.14 Mil/uL (ref 4.22–5.81)
RDW: 12.9 % (ref 11.5–15.5)
WBC: 10.7 10*3/uL — ABNORMAL HIGH (ref 4.0–10.5)

## 2019-07-14 LAB — COMPREHENSIVE METABOLIC PANEL
ALT: 25 U/L (ref 0–53)
AST: 16 U/L (ref 0–37)
Albumin: 4 g/dL (ref 3.5–5.2)
Alkaline Phosphatase: 80 U/L (ref 39–117)
BUN: 15 mg/dL (ref 6–23)
CO2: 28 mEq/L (ref 19–32)
Calcium: 9 mg/dL (ref 8.4–10.5)
Chloride: 104 mEq/L (ref 96–112)
Creatinine, Ser: 0.89 mg/dL (ref 0.40–1.50)
GFR: 88.61 mL/min (ref >60.00)
Glucose, Bld: 118 mg/dL — ABNORMAL HIGH (ref 70–99)
Potassium: 4.3 mEq/L (ref 3.5–5.1)
Sodium: 140 mEq/L (ref 135–145)
Total Bilirubin: 0.4 mg/dL (ref 0.2–1.2)
Total Protein: 6.5 g/dL (ref 6.0–8.3)

## 2019-07-14 LAB — LIPID PANEL
Cholesterol: 122 mg/dL (ref 0–200)
HDL: 38.5 mg/dL — ABNORMAL LOW (ref >39.00)
LDL Cholesterol: 74 mg/dL (ref 0–99)
NonHDL: 83.82
Total CHOL/HDL Ratio: 3
Triglycerides: 48 mg/dL (ref 0.0–149.0)
VLDL: 9.6 mg/dL (ref 0.0–40.0)

## 2019-07-14 LAB — PSA: PSA: 2.61 ng/mL (ref 0.10–4.00)

## 2019-07-14 LAB — TSH: TSH: 3.39 u[IU]/mL (ref 0.35–4.50)

## 2019-07-14 LAB — TESTOSTERONE: Testosterone: 266.98 ng/dL — ABNORMAL LOW (ref 300.00–890.00)

## 2019-07-15 DIAGNOSIS — D369 Benign neoplasm, unspecified site: Secondary | ICD-10-CM | POA: Insufficient documentation

## 2019-07-21 ENCOUNTER — Ambulatory Visit (INDEPENDENT_AMBULATORY_CARE_PROVIDER_SITE_OTHER): Payer: BC Managed Care – PPO | Admitting: Family Medicine

## 2019-07-21 ENCOUNTER — Other Ambulatory Visit: Payer: Self-pay

## 2019-07-21 ENCOUNTER — Encounter: Payer: Self-pay | Admitting: Family Medicine

## 2019-07-21 VITALS — BP 126/88 | HR 75 | Temp 97.8°F | Ht 69.0 in | Wt 327.5 lb

## 2019-07-21 DIAGNOSIS — J4530 Mild persistent asthma, uncomplicated: Secondary | ICD-10-CM

## 2019-07-21 DIAGNOSIS — I10 Essential (primary) hypertension: Secondary | ICD-10-CM

## 2019-07-21 DIAGNOSIS — Z7189 Other specified counseling: Secondary | ICD-10-CM

## 2019-07-21 DIAGNOSIS — Z0000 Encounter for general adult medical examination without abnormal findings: Secondary | ICD-10-CM

## 2019-07-21 DIAGNOSIS — G4733 Obstructive sleep apnea (adult) (pediatric): Secondary | ICD-10-CM

## 2019-07-21 DIAGNOSIS — R948 Abnormal results of function studies of other organs and systems: Secondary | ICD-10-CM

## 2019-07-21 MED ORDER — PULMICORT FLEXHALER 180 MCG/ACT IN AEPB: 1.0000 | INHALATION_SPRAY | Freq: Every day | RESPIRATORY_TRACT | Status: DC

## 2019-07-21 MED ORDER — LOSARTAN POTASSIUM 100 MG PO TABS: 100.0000 mg | ORAL_TABLET | Freq: Every day | ORAL | Status: DC

## 2019-07-21 MED ORDER — LOSARTAN POTASSIUM 100 MG PO TABS: 100.0000 mg | ORAL_TABLET | Freq: Every day | ORAL | 3 refills | Status: DC

## 2019-07-21 NOTE — Patient Instructions (Signed)
Thanks for getting a flu shot.  Update me as needed.  Keep working on diet and exercise.  Take care.  Glad to see you.

## 2019-07-21 NOTE — Progress Notes (Signed)
CPE- See plan.  Routine anticipatory guidance given to patient.  See health maintenance.  The possibility exists that previously documented standard health maintenance information may have been brought forward from a previous encounter into this note.  If needed, that same information has been updated to reflect the current situation based on today's encounter.    Tetanus 2011 PNA and shingles not due.  Flu shot done each fall. Colonoscopy 07/07/2019.   PSA screening d/w pt. PSA wnl. FH noted.  Living will d/w pt. Wife designated if patient were incapacitated.  HIV and HCV screening prev done.  He was doing well with 1 dose of pulmicort daily.  No SABA use.  D/w pt.  No cough.    Hypertension:    Using medication without problems or lightheadedness: yes Chest pain with exertion:no Edema: a little BLE, but that was related to drinking gatorade/salt. Resolved with cessation.   Short of breath:no Labs d/w pt.    He isn't having RUQ pain with trigger avoidance. D/w pt.  He didn't want to have cholecystectomy.    Pandemic considerations d/w pt.   He is on CPAP for OSA but he still has some fatigue.  Using CPAP for 6-8 hours, compliant.  He didn't feel a lot better but wife noted he wasn't snoring.  Lower T level noted, d/w pt about weight reduction but not starting T replacement given risk of replacement, d/w pt.    His foot pain is better with arch supports.    He'll work on diet and exercise to help with sugar, esp since his foot pain is better.  He was prev up to 338 on his home scales, d/w pt.   PMH and SH reviewed  Meds, vitals, and allergies reviewed.   ROS: Per HPI.  Unless specifically indicated otherwise in HPI, the patient denies:  General: fever. Eyes: acute vision changes ENT: sore throat Cardiovascular: chest pain Respiratory: SOB GI: vomiting GU: dysuria Musculoskeletal: acute back pain Derm: acute rash Neuro: acute motor dysfunction Psych: worsening  mood Endocrine: polydipsia Heme: bleeding Allergy: hayfever  GEN: nad, alert and oriented HEENT: ncat NECK: supple w/o LA CV: rrr. PULM: ctab, no inc wob ABD: soft, +bs EXT: no edema SKIN: no acute rash

## 2019-07-22 DIAGNOSIS — G4733 Obstructive sleep apnea (adult) (pediatric): Secondary | ICD-10-CM | POA: Diagnosis not present

## 2019-07-24 NOTE — Assessment & Plan Note (Signed)
He was doing well with 1 dose of pulmicort daily.  No SABA use.  D/w pt.  No cough.   Continue as is.  He agrees.

## 2019-07-24 NOTE — Assessment & Plan Note (Signed)
He is on CPAP for OSA but he still has some fatigue.  Using CPAP for 6-8 hours, compliant.  He didn't feel a lot better but wife noted he wasn't snoring.  Lower T level noted, d/w pt about weight reduction but not starting T replacement given risk of replacement, d/w pt.

## 2019-07-24 NOTE — Assessment & Plan Note (Signed)
Tetanus 2011 PNA and shingles not due.  Flu shot done each fall. Colonoscopy 07/07/2019.   PSA screening d/w pt. PSA wnl. FH noted.  Living will d/w pt. Wife designated if patient were incapacitated.  HIV and HCV screening prev done.

## 2019-07-24 NOTE — Assessment & Plan Note (Signed)
Living will d/w pt.  Wife designated if patient were incapacitated.   ?

## 2019-07-24 NOTE — Assessment & Plan Note (Signed)
No change in meds.  Continue work on diet and exercise.  He agrees. 

## 2019-07-24 NOTE — Assessment & Plan Note (Signed)
He isn't having RUQ pain with trigger avoidance. D/w pt.  He didn't want to have cholecystectomy.

## 2019-08-21 DIAGNOSIS — G4733 Obstructive sleep apnea (adult) (pediatric): Secondary | ICD-10-CM | POA: Diagnosis not present

## 2019-09-21 DIAGNOSIS — G4733 Obstructive sleep apnea (adult) (pediatric): Secondary | ICD-10-CM | POA: Diagnosis not present

## 2019-10-04 ENCOUNTER — Telehealth: Payer: Self-pay

## 2019-10-04 ENCOUNTER — Ambulatory Visit: Payer: BLUE CROSS/BLUE SHIELD | Admitting: Family Medicine

## 2019-10-04 ENCOUNTER — Encounter: Payer: Self-pay | Admitting: Family Medicine

## 2019-10-04 ENCOUNTER — Other Ambulatory Visit: Payer: Self-pay

## 2019-10-04 ENCOUNTER — Ambulatory Visit (INDEPENDENT_AMBULATORY_CARE_PROVIDER_SITE_OTHER)
Admission: RE | Admit: 2019-10-04 | Discharge: 2019-10-04 | Disposition: A | Discharge status: Home / Self Care | Payer: BLUE CROSS/BLUE SHIELD | Source: Ambulatory Visit | Attending: Family Medicine | Admitting: Family Medicine

## 2019-10-04 DIAGNOSIS — R109 Unspecified abdominal pain: Secondary | ICD-10-CM

## 2019-10-04 DIAGNOSIS — N200 Calculus of kidney: Secondary | ICD-10-CM | POA: Insufficient documentation

## 2019-10-04 DIAGNOSIS — Q631 Lobulated, fused and horseshoe kidney: Secondary | ICD-10-CM

## 2019-10-04 DIAGNOSIS — R197 Diarrhea, unspecified: Secondary | ICD-10-CM | POA: Diagnosis not present

## 2019-10-04 LAB — COMPREHENSIVE METABOLIC PANEL
ALT: 27 U/L (ref 0–53)
AST: 18 U/L (ref 0–37)
Albumin: 3.8 g/dL (ref 3.5–5.2)
Alkaline Phosphatase: 76 U/L (ref 39–117)
BUN: 14 mg/dL (ref 6–23)
CO2: 29 mEq/L (ref 19–32)
Calcium: 9 mg/dL (ref 8.4–10.5)
Chloride: 104 mEq/L (ref 96–112)
Creatinine, Ser: 0.82 mg/dL (ref 0.40–1.50)
GFR: 97.31 mL/min (ref >60.00)
Glucose, Bld: 107 mg/dL — ABNORMAL HIGH (ref 70–99)
Potassium: 4.2 mEq/L (ref 3.5–5.1)
Sodium: 137 mEq/L (ref 135–145)
Total Bilirubin: 0.4 mg/dL (ref 0.2–1.2)
Total Protein: 6.5 g/dL (ref 6.0–8.3)

## 2019-10-04 LAB — CBC WITH DIFFERENTIAL/PLATELET
Basophils Absolute: 0.1 10*3/uL (ref 0.0–0.1)
Basophils Relative: 0.5 % (ref 0.0–3.0)
Eosinophils Absolute: 0.2 10*3/uL (ref 0.0–0.7)
Eosinophils Relative: 1.4 % (ref 0.0–5.0)
HCT: 47 % (ref 39.0–52.0)
Hemoglobin: 15.6 g/dL (ref 13.0–17.0)
Lymphocytes Relative: 20.6 % (ref 12.0–46.0)
Lymphs Abs: 2.3 10*3/uL (ref 0.7–4.0)
MCHC: 33.2 g/dL (ref 30.0–36.0)
MCV: 91.6 fl (ref 78.0–100.0)
Monocytes Absolute: 1.3 10*3/uL — ABNORMAL HIGH (ref 0.1–1.0)
Monocytes Relative: 11.5 % (ref 3.0–12.0)
Neutro Abs: 7.4 10*3/uL (ref 1.4–7.7)
Neutrophils Relative %: 66 % (ref 43.0–77.0)
Platelets: 304 10*3/uL (ref 150.0–400.0)
RBC: 5.13 Mil/uL (ref 4.22–5.81)
RDW: 13.4 % (ref 11.5–15.5)
WBC: 11.2 10*3/uL — ABNORMAL HIGH (ref 4.0–10.5)

## 2019-10-04 MED ORDER — CIPROFLOXACIN HCL 500 MG PO TABS: 500.0000 mg | ORAL_TABLET | Freq: Two times a day (BID) | ORAL | 0 refills | Status: DC

## 2019-10-04 MED ORDER — IOHEXOL 300 MG/ML  SOLN
100.0000 mL | Freq: Once | INTRAMUSCULAR | Status: AC | PRN
  Administered 2019-10-04: 15:00:00 100 mL via INTRAVENOUS

## 2019-10-04 NOTE — Patient Instructions (Signed)
Go to the lab on the way out.   See Alex Oneal on the way out.  We'll get the CT set up.  We'll go from there.  Take care.  Glad to see you.

## 2019-10-04 NOTE — Telephone Encounter (Signed)
Called to let us know Results in Epic for urgent CT.

## 2019-10-04 NOTE — Progress Notes (Signed)
This visit occurred during the SARS-CoV-2 public health emergency.  Safety protocols were in place, including screening questions prior to the visit, additional usage of staff PPE, and extensive cleaning of exam room while observing appropriate contact time as indicated for disinfecting solutions.  covid vaccine d/w pt.  Encouraged.   L sided abd pain.  Not ttp but sore locally.  More pain with physical activity, less pain at rest.  Always in the same spot.  Going on for about 5 days, not worse, staying the same.  No fevers, chills, no vomiting.  Alternating solid/loose stools, at baseline.  No blood in stool. No blood in urine.  H/o nonobstructive renal stones, w/o prev passage.  No R sided abd pain.  Minimal back pain and he associated that with prolonged standing.  H/o diverticulosis.  No dysuria.  No clear trigger.  No ain with twisting.  No rash.  No bruising.  No black stools.    Meds, vitals, and allergies reviewed.   ROS: Per HPI unless specifically indicated in ROS section   GEN: nad, alert and oriented HEENT: ncat NECK: supple w/o LA CV: rrr.  no murmur PULM: ctab, no inc wob ABD: soft, +bs, ttp on the L side of the mid abdomen w/o rebound EXT: no edema SKIN: no acute rash

## 2019-10-05 NOTE — Assessment & Plan Note (Signed)
He does not have dysuria.  This does not appear to be limited to the abdominal wall.  No right-sided abdominal pain at all.  We discussed options.  The concern was for diverticulitis.  Routine labs collected, sent for CT.  I called him about the CT report.  He has known horseshoe kidney but with findings concerning for cystitis.  Report discussed with patient in detail, would start Cipro in the meantime.  Routine cautions given both for his condition and also for Cipro use.  He understands.  If he is getting worse in the meantime then he will let me know.  If he is gradually improving and feeling better than he will update me about his situation early next week when he is done with antibiotics.  He agrees with the plan and is okay for outpatient follow-up.  At least 30 minutes were devoted to patient care in this encounter (this can potentially include time spent reviewing the patient's file/history, interviewing and examining the patient, counseling/reviewing plan with patient, ordering referrals, ordering tests, reviewing relevant laboratory or x-ray data, and documenting the encounter).

## 2019-10-11 ENCOUNTER — Telehealth: Payer: Self-pay | Admitting: Family Medicine

## 2019-10-11 NOTE — Telephone Encounter (Signed)
Please triage patient and see what details you can get. Thanks.  

## 2019-10-11 NOTE — Telephone Encounter (Signed)
I spoke with pt; pt calling to give update; pt was seen in office on 10/04/19.pt is presently having pain where had hernia surgery right above naval. Pt does not feel a knot in that area. Pt thinks the pain at [employer] started the other day; cannot pin point an exact day when noticed pain. Pt wants to know if the CT of abdomen would show the area where his hernia was previously near [employer] and pt is presently having pain lower middle part of the small of pts back. Pt thinks he pulled his back when a 400 lb tank dropped and pt caught the tank. Pt said the back pain started 6 - 7 hrs after catching the tank. Pt is not having any fever or chills. Pt said he is no longer having pain on lt side of abd. No blood or mucus in stools or urine. Pt had slight episode of diarrhea that was less than 24 hrs on 10/09/19 until 10/10/19. No fever,chills, H/A,weakness, cough SOB,rash, loss of taste or smell, runny nose or S/T, and no bruising, bleeding, fatigue, or vomiting. Pt has not traveled and no exposure to + covid. Pt request Dr Para March to review and call pt back.

## 2019-10-11 NOTE — Telephone Encounter (Signed)
I left v/m for pt to return call to [facility] choosing option 4.

## 2019-10-11 NOTE — Telephone Encounter (Signed)
We can see that area on the CT but no hernia seen there.  if he doesn't feel a knot then I wouldn't suspect another hernia.  It sounds like he is getting better from the prev infection but had a sig muscle strain with the tank.  I would still expect him to gradually get better.  Update [place] as needed. Thanks.

## 2019-10-11 NOTE — Telephone Encounter (Signed)
Patient advised.

## 2019-10-11 NOTE — Telephone Encounter (Signed)
Patient called to let you know that he is still having pain but in a different area. Patient would like to speak with you.

## 2019-10-22 DIAGNOSIS — G4733 Obstructive sleep apnea (adult) (pediatric): Secondary | ICD-10-CM | POA: Diagnosis not present

## 2019-11-01 ENCOUNTER — Other Ambulatory Visit: Payer: Self-pay | Admitting: Family Medicine

## 2019-11-19 DIAGNOSIS — G4733 Obstructive sleep apnea (adult) (pediatric): Secondary | ICD-10-CM | POA: Diagnosis not present

## 2019-12-20 DIAGNOSIS — G4733 Obstructive sleep apnea (adult) (pediatric): Secondary | ICD-10-CM | POA: Diagnosis not present

## 2019-12-27 ENCOUNTER — Telehealth: Payer: Self-pay | Admitting: Family Medicine

## 2019-12-27 NOTE — Telephone Encounter (Signed)
If any CP, SOB, or neurologic sx, then to ER.  O/w offer OV here as soon as feasible with me or another provider.  Thanks.

## 2019-12-27 NOTE — Telephone Encounter (Signed)
Pt state that he has had a headache x 4 days that he cannot get rid of.  Eyes feel tired, no blurred vision. Denies N/V, confusion, tingling in arms/hands, imbalance.  NO head congestion, sinus pressure, fever or chills.  Taking Aleve, with little relief, temporary relief  Did receive the J&J vaccine about 1 month ago.   Please advise if needs OV in office or virtual, thanks.

## 2019-12-27 NOTE — Telephone Encounter (Signed)
Left detailed message on voicemail. DPR 

## 2019-12-28 NOTE — Telephone Encounter (Signed)
[place] [facility] Night - Client Nonclinical Telephone Record AccessNurse Client [place] [facility] Night - Client Client Site  [facility] [place] - Night Physician Raechel Ache - MD Contact Type Call Who Is Calling Patient / Member / Family / Caregiver Caller Name Alex Oneal Caller Phone Number [phone] Patient Name Alex Oneal Patient DOB April 24, 1966 Call Type Message Only Information Provided Reason for Call Request to Schedule Office Appointment Initial Comment Caller states that he would like to schedule an appointment with Dr. Para March. He was able to speak to Dr. Para March and he stated that he would be in the office on Thursday if he would like to schedule for that date. Additional Comment Caller states that he would like a call back from the office to schedule an appointment with Dr. Para March for Thursday. Disp. Time Disposition Final User 12/27/2019 6:13:58 PM General Information Provided Yes Cherylynn Ridges Call Closed By: Cherylynn Ridges Transaction Date/Time: 12/27/2019 6:11:19 PM (ET)

## 2019-12-28 NOTE — Telephone Encounter (Signed)
Pt scheduled for appt with Dr Para March on [place] 4/22 at 9am Pt declined a visit with another Provider today 4/21  No Neurological symptoms  Headache is about the same as last reported yesterday - started on Friday and has been consistent since for about 5 days now.   Will send back to [place] as FYI  For appt.   Nothing further needed.

## 2019-12-29 ENCOUNTER — Ambulatory Visit: Payer: BLUE CROSS/BLUE SHIELD | Admitting: Family Medicine

## 2019-12-29 ENCOUNTER — Other Ambulatory Visit: Payer: Self-pay

## 2019-12-29 ENCOUNTER — Encounter: Payer: Self-pay | Admitting: Family Medicine

## 2019-12-29 ENCOUNTER — Ambulatory Visit (INDEPENDENT_AMBULATORY_CARE_PROVIDER_SITE_OTHER)
Admission: RE | Admit: 2019-12-29 | Discharge: 2019-12-29 | Disposition: A | Discharge status: Home / Self Care | Payer: BLUE CROSS/BLUE SHIELD | Source: Ambulatory Visit | Attending: Family Medicine | Admitting: Family Medicine

## 2019-12-29 VITALS — BP 144/84 | HR 83 | Temp 97.1°F | Ht 69.0 in | Wt 329.5 lb

## 2019-12-29 DIAGNOSIS — M5481 Occipital neuralgia: Secondary | ICD-10-CM | POA: Diagnosis not present

## 2019-12-29 DIAGNOSIS — M50321 Other cervical disc degeneration at C4-C5 level: Secondary | ICD-10-CM | POA: Diagnosis not present

## 2019-12-29 DIAGNOSIS — M47812 Spondylosis without myelopathy or radiculopathy, cervical region: Secondary | ICD-10-CM | POA: Diagnosis not present

## 2019-12-29 MED ORDER — GABAPENTIN 100 MG PO CAPS: 100.0000 mg | ORAL_CAPSULE | Freq: Every day | ORAL | 3 refills | Status: DC

## 2019-12-29 NOTE — Patient Instructions (Signed)
Likely occipital neuralgia.   Gently stretch. Try gabapentin.  Go to the lab on the way out.   If you have mychart we'll likely use that to update you.    Update me if not better.   You may need to change pillows again.

## 2019-12-29 NOTE — Progress Notes (Signed)
This visit occurred during the SARS-CoV-2 public health emergency.  Safety protocols were in place, including screening questions prior to the visit, additional usage of staff PPE, and extensive cleaning of exam room while observing appropriate contact time as indicated for disinfecting solutions.  HA.  Today is day #6.  R top/lateral scalp, moves down to the back of the head and neck. Only on the R side.  R scalp "feels like it is asleep" when he is waking up.  No L sided sx.  He isn't have temperature sensitivity on his teeth.  No h/o like this prev.  He doesn't usually HA at baseline but had some in the distant past.  No FCNAVD.  He doesn't feel unwell.  "annoying dull and constant" discomfort.  Better with aleve.  No photosensitivity.  No weakness or changes in the ext now.  He had previous [employer] vaccine but no symptoms suggestive of venous clot.  Discussed with patient.  We talked about OTC vit D replacement.  Okay to try 1000 IU, d/w pt.    He has some episodic R leg troubles "where it goes out", once or twice over the years.  No pain with it.  No L leg sx. no symptoms currently.  This was stable today, given the headache considerations.  ED noted.  Can get erection but occ will not sustain.  Issue tabled for now given his headache.  He agrees.  Meds, vitals, and allergies reviewed.   ROS: Per HPI unless specifically indicated in ROS section   GEN: nad, alert and oriented HEENT: ncat, PERRL, EOMI, right scalp with slightly altered sensation but not on left side.  Temporal area not tender to palpation bilaterally.  He has return of symptoms with looking to the left but not the right. NECK: supple w/o LA CV: rrr.  PULM: ctab, no inc wob ABD: soft, +bs EXT: no edema SKIN: no acute rash CN 2-12 wnl B, S/S/DTR wnl B

## 2020-01-03 DIAGNOSIS — R519 Headache, unspecified: Secondary | ICD-10-CM | POA: Insufficient documentation

## 2020-01-03 DIAGNOSIS — M5481 Occipital neuralgia: Secondary | ICD-10-CM | POA: Insufficient documentation

## 2020-01-03 NOTE — Assessment & Plan Note (Signed)
Likely occipital neuralgia.  Discussed with patient about anatomy and pathophysiology. He can gently stretch his neck. Try gabapentin with routine cautions.  See notes on imaging. Update me if not better.   He may need to change pillows again, especially since he has paresthesias on the right side of the scalp upon waking.  At least 30 minutes were devoted to patient care in this encounter (this can potentially include time spent reviewing the patient's file/history, interviewing and examining the patient, counseling/reviewing plan with patient, ordering referrals, ordering tests, reviewing relevant laboratory or x-ray data, and documenting the encounter).

## 2020-01-13 ENCOUNTER — Telehealth: Payer: Self-pay

## 2020-01-13 NOTE — Telephone Encounter (Signed)
Spoke with pt relaying Dr. G's message.  Pt verbalizes understanding and expresses his thanks.  

## 2020-01-13 NOTE — Telephone Encounter (Signed)
Patient states that he started Gabapentin 100 mg 1 tablet daily 2 weeks ago. He read that it takes about 2 weeks for the medication to get in his system and wonders if he is having side effect from it. Patient states last night he was laying in his bed and had 2 episodes of feeling like his head was spinning -lasted for a few seconds. Then this morning he was unsteady on his feet when he went to the bathroom that lasted for a few seconds. He did not have any other symptoms such as chest pain, SOB, weakness, numbness, or syncope. Patient does not have a way of checking his b/p but he will go by the fire station to have this checked and will call us back if it is abnormal.  I offered patient an appointment today but he could not make it today, stated he would not be able to be around this area until like 4:30 pm. Advised patient if his symptoms got worse or he had abnormal b/p to be seen at urgent care or ER today. Patient verbalized understanding.  I advised patient I would send a message back to Dr Para March and covering physician to get feedback of what patient needs to do at this time, appointment next week? Change medication? Patient also states that gabapentin has helped his neck pain some-like 30% better, but not much

## 2020-01-13 NOTE — Telephone Encounter (Signed)
See below.  Agreed. Thanks.

## 2020-01-13 NOTE — Telephone Encounter (Signed)
This could possibly be side effect to gabapentin, but he is on the lowest possible dose of med. This could also be some vertigo developing - if recurrent or worsening, agree with in-person eval.  As far as neck pain just a little better, next step would usually be to increase gabapentin dose, however hesitant to do this if concerned with side effects from med..  Recommend continued gentle stretching, heating pad to neck covered in a towel 10-17min at a time. If no improvement with this, let us know for office visit.

## 2020-01-19 ENCOUNTER — Other Ambulatory Visit: Payer: Self-pay

## 2020-01-19 ENCOUNTER — Ambulatory Visit: Payer: BLUE CROSS/BLUE SHIELD | Admitting: Family Medicine

## 2020-01-19 ENCOUNTER — Encounter: Payer: Self-pay | Admitting: Family Medicine

## 2020-01-19 VITALS — BP 140/86 | HR 85 | Temp 98.7°F | Ht 69.0 in | Wt 330.8 lb

## 2020-01-19 DIAGNOSIS — R102 Pelvic and perineal pain: Secondary | ICD-10-CM | POA: Diagnosis not present

## 2020-01-19 DIAGNOSIS — R519 Headache, unspecified: Secondary | ICD-10-CM | POA: Diagnosis not present

## 2020-01-19 DIAGNOSIS — Q631 Lobulated, fused and horseshoe kidney: Secondary | ICD-10-CM

## 2020-01-19 DIAGNOSIS — H6591 Unspecified nonsuppurative otitis media, right ear: Secondary | ICD-10-CM | POA: Diagnosis not present

## 2020-01-19 DIAGNOSIS — R1024 Suprapubic pain: Secondary | ICD-10-CM

## 2020-01-19 DIAGNOSIS — N200 Calculus of kidney: Secondary | ICD-10-CM

## 2020-01-19 DIAGNOSIS — M5481 Occipital neuralgia: Secondary | ICD-10-CM

## 2020-01-19 DIAGNOSIS — G4733 Obstructive sleep apnea (adult) (pediatric): Secondary | ICD-10-CM | POA: Diagnosis not present

## 2020-01-19 LAB — POC URINALSYSI DIPSTICK (AUTOMATED)
Bilirubin, UA: NEGATIVE
Blood, UA: NEGATIVE
Glucose, UA: NEGATIVE
Ketones, UA: NEGATIVE
Leukocytes, UA: NEGATIVE
Nitrite, UA: NEGATIVE
Protein, UA: NEGATIVE
Spec Grav, UA: >=1.03 — AB (ref 1.010–1.025)
Urobilinogen, UA: 0.2 E.U./dL
pH, UA: 5.5 (ref 5.0–8.0)

## 2020-01-19 MED ORDER — AMOXICILLIN 500 MG PO CAPS: 1000.0000 mg | ORAL_CAPSULE | Freq: Two times a day (BID) | ORAL | 0 refills | Status: DC

## 2020-01-19 NOTE — Assessment & Plan Note (Signed)
Degenerative changes on cervical spine film. Did not tolerate gabapentin.  Possibly small  Sore lymph node from ear issue causing pain.

## 2020-01-19 NOTE — Assessment & Plan Note (Signed)
Treat with antibiotics and flonase.  follow up if not improving.

## 2020-01-19 NOTE — Assessment & Plan Note (Signed)
Not clearly groin strain, but possibly could be abdominal wall strain.Marland Kitchen pt remembers no injury. Ua clear.. no suggestion of infeciton or stone but pt has horseshoe kidney with stnes.  Not clearly consistent with diverticulitis or colitis. Will eval with cbc and CMET.

## 2020-01-19 NOTE — Patient Instructions (Addendum)
Complete antibiotics for ear infection. Can use flonase 2 sprays daily to drain fluid from behind ear. Increase fluid intake.  Please stop at the lab to have labs drawn.

## 2020-01-19 NOTE — Progress Notes (Signed)
Chief Complaint  Patient presents with  . Neck Pain    C/o continued neck pain.  Seen previously for sxs, given gabapentin.    . Groin Pain    C/o groin pain, worsening.  Started 01/17/20.  Now affecting walking.     History of Present Illness: HPI  54 year old male presents with new onset neck pain  Has had neck pain in last month.  Saw Dr.  Para March on 12/29/2019  Dx with occipital neuralgia. Treated with gabapentin. right posterior neck pain, pain in right ear. No decrease in hearing.  X-ray: Cervical disc degeneration at C4-C5 and C5-C6.  The numbness in scalp has resolved. Had SE to Gabapentin.. unsteady on feet, heavy in head. Stopped this. Minimal improvement in neck pain.. radiates  To right base of neck towards shoulder    Also new onset bilateral groin pain in last 2 days. 5/10 on pain scale No dysuria, no increase frequency.  No blood in stool or urine.  No C/D.  No fever.  Moving from sitting  to standing.  no pain with ext rotation of hi[p. He has tried aleve with no improvement.  Hx of UTI. Hx of horseshow kidney  Hx of kidney stone. ( reviewed CT from 09/2019)  This visit occurred during the SARS-CoV-2 public health emergency.  Safety protocols were in place, including screening questions prior to the visit, additional usage of staff PPE, and extensive cleaning of exam room while observing appropriate contact time as indicated for disinfecting solutions.   COVID 19 screen:  No recent travel or known exposure to COVID19 The patient denies respiratory symptoms of COVID 19 at this time. The importance of social distancing was discussed today.     Review of Systems  Constitutional: Negative for chills and fever.  HENT: Positive for ear pain. Negative for congestion and sore throat.   Eyes: Negative for pain and redness.  Respiratory: Negative for cough and shortness of breath.   Cardiovascular: Negative for chest pain, palpitations and leg swelling.   Gastrointestinal: Negative for abdominal pain, blood in stool, constipation, diarrhea, nausea and vomiting.  Genitourinary: Negative for dysuria.  Musculoskeletal: Positive for neck pain. Negative for falls and myalgias.  Skin: Negative for rash.  Neurological: Negative for dizziness.  Psychiatric/Behavioral: Negative for depression. The patient is not nervous/anxious.       Past Medical History:  Diagnosis Date  . Abnormal biliary HIDA scan   . Arthritis   . Asthma    exercise induced  . Former smoker    QUIT 02/2005  . Glucose intolerance (impaired glucose tolerance)   . Gynecomastia, male   . Horseshoe kidney   . Hypertension   . Obesity   . Shortness of breath dyspnea    with activfity    reports that he quit smoking about 14 years ago. His smoking use included cigarettes. He has a 50.00 pack-year smoking history. He has never used smokeless tobacco. He reports current alcohol use of about 2.0 standard drinks of alcohol per week. He reports that he does not use drugs.   Current Outpatient Medications:  .  albuterol (VENTOLIN HFA) 108 (90 Base) MCG/ACT inhaler, Inhale 2 puffs into the lungs every 6 (six) hours as needed for wheezing or shortness of breath. Hold for patient to request later., Disp: 18 g, Rfl: 3 .  losartan (COZAAR) 100 MG tablet, Take 1 tablet (100 mg total) by mouth daily., Disp: 90 tablet, Rfl: 3 .  PULMICORT FLEXHALER 180 MCG/ACT inhaler,  INHALE 1 PUFF BY MOUTH TWICE DAILY, Disp: 1 each, Rfl: 2 .  gabapentin (NEURONTIN) 100 MG capsule, Take 1-2 capsules (100-200 mg total) by mouth at bedtime. (Patient not taking: Reported on 01/19/2020), Disp: 60 capsule, Rfl: 3   Observations/Objective: Blood pressure 140/86, pulse 85, temperature 98.7 F (37.1 C), temperature source Temporal, height 5\' 9"  (1.753 m), weight (!) 330 lb 12 oz (150 kg), SpO2 97 %.  Physical Exam Constitutional:      Appearance: He is well-developed. He is obese.  HENT:     Head:  Normocephalic.     Right Ear: Hearing normal. Tenderness present. A middle ear effusion is present. Tympanic membrane is injected and bulging. Tympanic membrane has decreased mobility.     Left Ear: Hearing, tympanic membrane, ear canal and external ear normal.     Nose: Nose normal.  Neck:     Thyroid: No thyroid mass or thyromegaly.     Vascular: No carotid bruit.     Trachea: Trachea normal.  Cardiovascular:     Rate and Rhythm: Normal rate and regular rhythm.     Pulses: Normal pulses.     Heart sounds: Heart sounds not distant. No murmur. No friction rub. No gallop.      Comments: No peripheral edema Pulmonary:     Effort: Pulmonary effort is normal. No respiratory distress.     Breath sounds: Normal breath sounds.  Abdominal:     Tenderness: There is abdominal tenderness in the suprapubic area. There is no right CVA tenderness, left CVA tenderness, guarding or rebound.  Skin:    General: Skin is warm and dry.     Findings: No rash.  Psychiatric:        Speech: Speech normal.        Behavior: Behavior normal.        Thought Content: Thought content normal.      Assessment and Plan   Occipital neuralgia Degenerative changes on cervical spine film. Did not tolerate gabapentin.  Possibly small  Sore lymph node from ear issue causing pain.  Right otitis media with effusion Treat with antibiotics and flonase.  follow up if not improving.  Suprapubic pain, acute Not clearly groin strain, but possibly could be abdominal wall strain.Marland Kitchen pt remembers no injury. Ua clear.. no suggestion of infeciton or stone but pt has horseshoe kidney with stnes.  Not clearly consistent with diverticulitis or colitis. Will eval with cbc and CMET.     Eliezer Lofts, MD

## 2020-01-20 LAB — COMPREHENSIVE METABOLIC PANEL
ALT: 28 U/L (ref 0–53)
AST: 18 U/L (ref 0–37)
Albumin: 3.8 g/dL (ref 3.5–5.2)
Alkaline Phosphatase: 79 U/L (ref 39–117)
BUN: 19 mg/dL (ref 6–23)
CO2: 29 mEq/L (ref 19–32)
Calcium: 8.7 mg/dL (ref 8.4–10.5)
Chloride: 105 mEq/L (ref 96–112)
Creatinine, Ser: 0.95 mg/dL (ref 0.40–1.50)
GFR: 82.03 mL/min (ref >60.00)
Glucose, Bld: 128 mg/dL — ABNORMAL HIGH (ref 70–99)
Potassium: 4.2 mEq/L (ref 3.5–5.1)
Sodium: 138 mEq/L (ref 135–145)
Total Bilirubin: 0.3 mg/dL (ref 0.2–1.2)
Total Protein: 6.3 g/dL (ref 6.0–8.3)

## 2020-01-20 LAB — CBC WITH DIFFERENTIAL/PLATELET
Basophils Absolute: 0.1 10*3/uL (ref 0.0–0.1)
Basophils Relative: 1.2 % (ref 0.0–3.0)
Eosinophils Absolute: 0.2 10*3/uL (ref 0.0–0.7)
Eosinophils Relative: 1.9 % (ref 0.0–5.0)
HCT: 45.7 % (ref 39.0–52.0)
Hemoglobin: 15.2 g/dL (ref 13.0–17.0)
Lymphocytes Relative: 22.2 % (ref 12.0–46.0)
Lymphs Abs: 2.6 10*3/uL (ref 0.7–4.0)
MCHC: 33.3 g/dL (ref 30.0–36.0)
MCV: 92.4 fl (ref 78.0–100.0)
Monocytes Absolute: 1.5 10*3/uL — ABNORMAL HIGH (ref 0.1–1.0)
Monocytes Relative: 12.7 % — ABNORMAL HIGH (ref 3.0–12.0)
Neutro Abs: 7.2 10*3/uL (ref 1.4–7.7)
Neutrophils Relative %: 62 % (ref 43.0–77.0)
Platelets: 302 10*3/uL (ref 150.0–400.0)
RBC: 4.95 Mil/uL (ref 4.22–5.81)
RDW: 13.1 % (ref 11.5–15.5)
WBC: 11.6 10*3/uL — ABNORMAL HIGH (ref 4.0–10.5)

## 2020-02-19 DIAGNOSIS — G4733 Obstructive sleep apnea (adult) (pediatric): Secondary | ICD-10-CM | POA: Diagnosis not present

## 2020-03-20 DIAGNOSIS — G4733 Obstructive sleep apnea (adult) (pediatric): Secondary | ICD-10-CM | POA: Diagnosis not present

## 2020-04-20 DIAGNOSIS — G4733 Obstructive sleep apnea (adult) (pediatric): Secondary | ICD-10-CM | POA: Diagnosis not present

## 2020-05-21 DIAGNOSIS — G4733 Obstructive sleep apnea (adult) (pediatric): Secondary | ICD-10-CM | POA: Diagnosis not present

## 2020-06-20 DIAGNOSIS — G4733 Obstructive sleep apnea (adult) (pediatric): Secondary | ICD-10-CM | POA: Diagnosis not present

## 2020-07-15 ENCOUNTER — Other Ambulatory Visit: Payer: Self-pay | Admitting: Family Medicine

## 2020-07-15 DIAGNOSIS — I10 Essential (primary) hypertension: Secondary | ICD-10-CM

## 2020-07-15 DIAGNOSIS — Z125 Encounter for screening for malignant neoplasm of prostate: Secondary | ICD-10-CM

## 2020-07-21 DIAGNOSIS — G4733 Obstructive sleep apnea (adult) (pediatric): Secondary | ICD-10-CM | POA: Diagnosis not present

## 2020-07-24 ENCOUNTER — Other Ambulatory Visit (INDEPENDENT_AMBULATORY_CARE_PROVIDER_SITE_OTHER): Payer: BLUE CROSS/BLUE SHIELD

## 2020-07-24 ENCOUNTER — Other Ambulatory Visit: Payer: Self-pay

## 2020-07-24 DIAGNOSIS — Z125 Encounter for screening for malignant neoplasm of prostate: Secondary | ICD-10-CM | POA: Diagnosis not present

## 2020-07-24 DIAGNOSIS — I10 Essential (primary) hypertension: Secondary | ICD-10-CM

## 2020-07-24 LAB — COMPREHENSIVE METABOLIC PANEL
ALT: 26 U/L (ref 0–53)
AST: 16 U/L (ref 0–37)
Albumin: 3.8 g/dL (ref 3.5–5.2)
Alkaline Phosphatase: 80 U/L (ref 39–117)
BUN: 16 mg/dL (ref 6–23)
CO2: 30 mEq/L (ref 19–32)
Calcium: 9 mg/dL (ref 8.4–10.5)
Chloride: 101 mEq/L (ref 96–112)
Creatinine, Ser: 0.96 mg/dL (ref 0.40–1.50)
GFR: 88.4 mL/min (ref >60.00)
Glucose, Bld: 123 mg/dL — ABNORMAL HIGH (ref 70–99)
Potassium: 4.6 mEq/L (ref 3.5–5.1)
Sodium: 138 mEq/L (ref 135–145)
Total Bilirubin: 0.4 mg/dL (ref 0.2–1.2)
Total Protein: 6.3 g/dL (ref 6.0–8.3)

## 2020-07-24 LAB — CBC WITH DIFFERENTIAL/PLATELET
Basophils Absolute: 0.1 10*3/uL (ref 0.0–0.1)
Basophils Relative: 1.1 % (ref 0.0–3.0)
Eosinophils Absolute: 0.2 10*3/uL (ref 0.0–0.7)
Eosinophils Relative: 2 % (ref 0.0–5.0)
HCT: 48.7 % (ref 39.0–52.0)
Hemoglobin: 16.1 g/dL (ref 13.0–17.0)
Lymphocytes Relative: 21.6 % (ref 12.0–46.0)
Lymphs Abs: 2.5 10*3/uL (ref 0.7–4.0)
MCHC: 33.1 g/dL (ref 30.0–36.0)
MCV: 92.2 fl (ref 78.0–100.0)
Monocytes Absolute: 1.1 10*3/uL — ABNORMAL HIGH (ref 0.1–1.0)
Monocytes Relative: 9.1 % (ref 3.0–12.0)
Neutro Abs: 7.8 10*3/uL — ABNORMAL HIGH (ref 1.4–7.7)
Neutrophils Relative %: 66.2 % (ref 43.0–77.0)
Platelets: 317 10*3/uL (ref 150.0–400.0)
RBC: 5.29 Mil/uL (ref 4.22–5.81)
RDW: 12.9 % (ref 11.5–15.5)
WBC: 11.8 10*3/uL — ABNORMAL HIGH (ref 4.0–10.5)

## 2020-07-24 LAB — LIPID PANEL
Cholesterol: 118 mg/dL (ref 0–200)
HDL: 40.6 mg/dL (ref >39.00)
LDL Cholesterol: 67 mg/dL (ref 0–99)
NonHDL: 77.19
Total CHOL/HDL Ratio: 3
Triglycerides: 49 mg/dL (ref 0.0–149.0)
VLDL: 9.8 mg/dL (ref 0.0–40.0)

## 2020-07-24 LAB — PSA: PSA: 2.36 ng/mL (ref 0.10–4.00)

## 2020-07-27 ENCOUNTER — Other Ambulatory Visit: Payer: Self-pay

## 2020-07-27 ENCOUNTER — Ambulatory Visit (INDEPENDENT_AMBULATORY_CARE_PROVIDER_SITE_OTHER): Payer: BLUE CROSS/BLUE SHIELD | Admitting: Family Medicine

## 2020-07-27 ENCOUNTER — Encounter: Payer: Self-pay | Admitting: Family Medicine

## 2020-07-27 VITALS — BP 138/96 | HR 78 | Temp 96.7°F | Ht 70.5 in | Wt 330.5 lb

## 2020-07-27 DIAGNOSIS — Z23 Encounter for immunization: Secondary | ICD-10-CM

## 2020-07-27 DIAGNOSIS — Z0001 Encounter for general adult medical examination with abnormal findings: Secondary | ICD-10-CM | POA: Diagnosis not present

## 2020-07-27 DIAGNOSIS — R948 Abnormal results of function studies of other organs and systems: Secondary | ICD-10-CM

## 2020-07-27 DIAGNOSIS — Z7189 Other specified counseling: Secondary | ICD-10-CM

## 2020-07-27 DIAGNOSIS — J4530 Mild persistent asthma, uncomplicated: Secondary | ICD-10-CM

## 2020-07-27 DIAGNOSIS — G2581 Restless legs syndrome: Secondary | ICD-10-CM

## 2020-07-27 MED ORDER — ALBUTEROL SULFATE HFA 108 (90 BASE) MCG/ACT IN AERS: 2.0000 | INHALATION_SPRAY | Freq: Four times a day (QID) | RESPIRATORY_TRACT | 3 refills | Status: DC | PRN

## 2020-07-27 MED ORDER — ROPINIROLE HCL 0.25 MG PO TABS
0.2500 mg | ORAL_TABLET | Freq: Every day | ORAL | 1 refills | Status: DC
Start: 2020-07-27 — End: 2021-07-30

## 2020-07-27 MED ORDER — PULMICORT FLEXHALER 180 MCG/ACT IN AEPB
1.0000 | INHALATION_SPRAY | Freq: Two times a day (BID) | RESPIRATORY_TRACT | 5 refills | Status: DC
Start: 2020-07-27 — End: 2021-07-30

## 2020-07-27 MED ORDER — LOSARTAN POTASSIUM 100 MG PO TABS
100.0000 mg | ORAL_TABLET | Freq: Every day | ORAL | 3 refills | Status: DC
Start: 2020-07-27 — End: 2021-07-30

## 2020-07-27 NOTE — Patient Instructions (Addendum)
Flu shot today.  Glad you got a booster.  Try requip and see if that helps.  Increase to 2 tabs at night if needed.   Either way, update me.  Take care.  Glad to see you.  If your BP stays above 140/90, then let me know.

## 2020-07-27 NOTE — Progress Notes (Signed)
This visit occurred during the SARS-CoV-2 public health emergency.  Safety protocols were in place, including screening questions prior to the visit, additional usage of staff PPE, and extensive cleaning of exam room while observing appropriate contact time as indicated for disinfecting solutions.  CPE- See plan.  Routine anticipatory guidance given to patient.  See health maintenance.  The possibility exists that previously documented standard health maintenance information may have been brought forward from a previous encounter into this note.  If needed, that same information has been updated to reflect the current situation based on today's encounter.    Tetanus 2011 PNA d/w pt.  Shingles d/w pt.   Flu shot today  covid vaccine 2021, J&J booster done 07/12/20.   Colonoscopy 07/07/2019.   PSA screening d/w pt. PSA wnl. FH noted.  Living will d/w pt. Wife designated if patient were incapacitated.  HIV and HCV screening prev done.  He is still doing well with 1 dose of pulmicort daily.  No SABA use.  D/w pt.  No cough.  no wheeze.    Hypertension:               Using medication without problems or lightheadedness: yes Chest pain with exertion:no Edema: no Short of breath:no Labs d/w pt.    He isn't having RUQ pain with trigger avoidance. D/w pt.  He didn't want to have cholecystectomy.    Pandemic considerations d/w pt.   He has h/o OSA but was having trouble sleeping at baseline/using CPAP so he stopped it over the summer.  He failed tx with mouthguard.  He is waking from RLS sx at night, movement at night.  He doesn't have parkinsonian symptoms.  requip cautions d/w pt.    PMH and SH reviewed  Meds, vitals, and allergies reviewed.   ROS: Per HPI.  Unless specifically indicated otherwise in HPI, the patient denies:  General: fever. Eyes: acute vision changes ENT: sore throat Cardiovascular: chest pain Respiratory: SOB GI: vomiting GU: dysuria Musculoskeletal:  acute back pain Derm: acute rash Neuro: acute motor dysfunction Psych: worsening mood Endocrine: polydipsia Heme: bleeding Allergy: hayfever  GEN: nad, alert and oriented HEENT: ncat NECK: supple w/o LA CV: rrr. PULM: ctab, no inc wob ABD: soft, +bs, not ttp EXT: trace BLE edema SKIN: no acute rash No tremor.

## 2020-07-29 DIAGNOSIS — G2581 Restless legs syndrome: Secondary | ICD-10-CM | POA: Insufficient documentation

## 2020-07-29 NOTE — Assessment & Plan Note (Signed)
He isn't having RUQ pain with trigger avoidance. D/w pt.  He didn't want to have cholecystectomy.    Discussed.  He will update me as needed.  No abdominal pain.

## 2020-07-29 NOTE — Assessment & Plan Note (Signed)
He has h/o OSA but was having trouble sleeping at baseline/using CPAP so he stopped it over the summer.  He failed tx with mouthguard.  He is waking from RLS sx at night, movement at night.  He doesn't have parkinsonian symptoms.  requip cautions d/w pt. he can try that at 0.25 mg at night.  Can increase to 0.5 mg if needed/tolerated.  If we can get his RLS symptoms under control then he may be able to tolerate CPAP better.  Discussed.  He will update me as needed.  Okay for outpatient follow-up.

## 2020-07-29 NOTE — Assessment & Plan Note (Signed)
  Tetanus 2011 PNA d/w pt.  Shingles d/w pt.   Flu shot today  covid vaccine 2021, J&J booster done 07/12/20.   Colonoscopy 07/07/2019.   PSA screening d/w pt. PSA wnl. FH noted.  Living will d/w pt. Wife designated if patient were incapacitated.  HIV and HCV screening prev done.

## 2020-07-29 NOTE — Assessment & Plan Note (Signed)
Living will d/w pt.  Wife designated if patient were incapacitated.   ?

## 2020-07-29 NOTE — Assessment & Plan Note (Signed)
He is still doing well with 1 dose of pulmicort daily.  No SABA use.  D/w pt.  No cough.  no wheeze.

## 2020-08-16 ENCOUNTER — Ambulatory Visit: Payer: BLUE CROSS/BLUE SHIELD | Admitting: Internal Medicine

## 2020-08-16 ENCOUNTER — Ambulatory Visit (INDEPENDENT_AMBULATORY_CARE_PROVIDER_SITE_OTHER)
Admission: RE | Admit: 2020-08-16 | Discharge: 2020-08-16 | Disposition: A | Discharge status: Home / Self Care | Payer: BLUE CROSS/BLUE SHIELD | Source: Ambulatory Visit | Attending: Internal Medicine | Admitting: Internal Medicine

## 2020-08-16 ENCOUNTER — Other Ambulatory Visit: Payer: Self-pay

## 2020-08-16 ENCOUNTER — Encounter: Payer: Self-pay | Admitting: Internal Medicine

## 2020-08-16 VITALS — BP 142/90 | HR 77 | Temp 97.7°F | Wt 337.0 lb

## 2020-08-16 DIAGNOSIS — R0789 Other chest pain: Secondary | ICD-10-CM | POA: Diagnosis not present

## 2020-08-16 DIAGNOSIS — M546 Pain in thoracic spine: Secondary | ICD-10-CM

## 2020-08-16 DIAGNOSIS — M4804 Spinal stenosis, thoracic region: Secondary | ICD-10-CM | POA: Diagnosis not present

## 2020-08-16 DIAGNOSIS — R079 Chest pain, unspecified: Secondary | ICD-10-CM | POA: Diagnosis not present

## 2020-08-16 DIAGNOSIS — M47814 Spondylosis without myelopathy or radiculopathy, thoracic region: Secondary | ICD-10-CM | POA: Diagnosis not present

## 2020-08-16 MED ORDER — CYCLOBENZAPRINE HCL 10 MG PO TABS: 10.0000 mg | ORAL_TABLET | Freq: Three times a day (TID) | ORAL | 0 refills | Status: DC | PRN

## 2020-08-16 MED ORDER — KETOROLAC TROMETHAMINE 30 MG/ML IJ SOLN
30.0000 mg | Freq: Once | INTRAMUSCULAR | Status: AC
  Administered 2020-08-16: 30 mg via INTRAMUSCULAR

## 2020-08-16 NOTE — Progress Notes (Signed)
Subjective:    Patient ID: Alex Oneal, male    DOB: 07-Jul-1966, 54 y.o.   MRN: [id number]  HPI  Pt presents to the clinic today with c/o pain between his shoulder blades. He noticed this 1 week ago. He describes the pain as sharp. The pain radiates through to his chest and neck. The pain is worse with movement but he notices it more at rest. He denies any injury to the area. He denies shortness of breath. He denies dizziness, visual changes, diaphoresis, nausea, vomiting or reflux. He has not noticed a rash in the area. He reports history of chronic cough due to smoking years ago, that is not worse. He denies recent travel via airplane or long car ride. He has taken Naproxen OTC with minimal relief of symptoms.  Review of Systems      Past Medical History:  Diagnosis Date  . Abnormal biliary HIDA scan   . Arthritis   . Asthma    exercise induced  . Former smoker    QUIT 02/2005  . Glucose intolerance (impaired glucose tolerance)   . Gynecomastia, male   . Horseshoe kidney   . Hypertension   . Obesity   . Shortness of breath dyspnea    with activfity    Current Outpatient Medications  Medication Sig Dispense Refill  . albuterol (VENTOLIN HFA) 108 (90 Base) MCG/ACT inhaler Inhale 2 puffs into the lungs every 6 (six) hours as needed for wheezing or shortness of breath. Hold for patient to request later. 18 g 3  . budesonide (PULMICORT FLEXHALER) 180 MCG/ACT inhaler Inhale 1 puff into the lungs 2 (two) times daily. 1 each 5  . losartan (COZAAR) 100 MG tablet Take 1 tablet (100 mg total) by mouth daily. 90 tablet 3  . rOPINIRole (REQUIP) 0.25 MG tablet Take 1-2 tablets (0.25-0.5 mg total) by mouth at bedtime. 60 tablet 1   No current facility-administered medications for this visit.    Allergies  Allergen Reactions  . Fire [employer] Other (See Comments)    Significant local reaction but no lip swelling.    . Gabapentin Other (See Comments)    Pt states that he gets dizzy when he  gets out of bed while on this  . Vioxx [Rofecoxib] Other (See Comments)    GI upset but not a true allergy    Family History  Problem Relation Age of Onset  . Arthritis Mother   . Heart disease Mother 32  . Cancer Mother        colon, breast, ovarian  . Allergies Mother   . Kidney failure Mother   . Kidney disease Mother        died of renal failure  . Colon cancer Mother   . Arthritis Father   . Hypertension Father   . Allergies Father   . Asthma Father   . Parkinsonism Father        possible environmental causes  . Arthritis Paternal Aunt   . Cancer Paternal Aunt   . Arthritis Paternal Uncle   . Cancer Paternal Uncle   . Arthritis Paternal Grandmother   . Arthritis Paternal Grandfather   . Cancer Paternal Grandfather   . Stroke Paternal Grandfather   . Allergies Brother   . Kidney failure Maternal Grandfather   . Prostate cancer Maternal Grandfather   . Aneurysm Neg Hx     Social History   Socioeconomic History  . Marital status: Married    Spouse name: [name]  .  Number of children: 1  . Years of education: Not on file  . Highest education level: Not on file  Occupational History  . Occupation: office manager/outside Investment banker, corporate: [employer]   Tobacco Use  . Smoking status: Former Smoker    Packs/day: 2.00    Years: 25.00    Pack years: 50.00    Types: Cigarettes    Quit date: 02/10/2005    Years since quitting: 15.5  . Smokeless tobacco: Never Used  Vaping Use  . Vaping Use: Never used  Substance and Sexual Activity  . Alcohol use: Yes    Alcohol/week: 2.0 standard drinks    Types: 2 Shots of liquor per week  . Drug use: No  . Sexual activity: Yes  Other Topics Concern  . Not on file  Social History Narrative   Remarried 1997   Son born 2001   [employer]- office supervisor   Social Determinants of Health   Financial Resource Strain: Not on file  Food Insecurity: Not on file  Transportation Needs: Not on file   Physical Activity: Not on file  Stress: Not on file  Social Connections: Not on file  Intimate Partner Violence: Not on file     Constitutional: Denies fever, malaise, fatigue, headache or abrupt weight changes.  HEENT: Denies eye pain, eye redness, ear pain, ringing in the ears, wax buildup, runny nose, nasal congestion, bloody nose, or sore throat. Respiratory: Pt reports chronic cough. Denies difficulty breathing, shortness of breath.   Cardiovascular: Denies chest pain, chest tightness, palpitations or swelling in the hands or feet.  Gastrointestinal: Denies abdominal pain, bloating, constipation, diarrhea or blood in the stool.  Musculoskeletal: Pt reports pain between shoulder blades. Denies decrease in range of motion, difficulty with gait, or joint swelling.  Skin: Denies redness, rashes, lesions or ulcercations.    No other specific complaints in a complete review of systems (except as listed in HPI above).  Objective:   Physical Exam   BP (!) 142/90   Pulse 77   Temp 97.7 F (36.5 C) (Temporal)   Wt (!) 337 lb (152.9 kg)   SpO2 98%   BMI 47.67 kg/m   Wt Readings from Last 3 Encounters:  07/27/20 (!) 330 lb 8 oz (149.9 kg)  01/19/20 (!) 330 lb 12 oz (150 kg)  12/29/19 (!) 329 lb 8 oz (149.5 kg)    General: Appears his stated age, obese, in NAD. Skin: Warm, dry and intact. No rashesnoted. HEENT: Head: normal shape and size; Eyes: sclera white, no icterus, conjunctiva pink, PERRLA and EOMs intact;  Neck:  Neck supple, trachea midline. No masses, lumps or thyromegaly present.  Cardiovascular: Normal rate and rhythm. S1,S2 noted.  No murmur, rubs or gallops noted. Trace pitting BLE edema. Pulmonary/Chest: Normal effort and positive vesicular breath sounds. No respiratory distress. No wheezes, rales or ronchi noted.  Abdomen: Soft and nontender. Normal bowel sounds. No distention or masses noted.  Musculoskeletal: Normal flexion, extension and rotation of the spine.  Bony tenderness noted over the thoracic spine. No difficulty with gait.  Neurological: Alert and oriented.   BMET    Component Value Date/Time   NA 138 07/24/2020 0751   K 4.6 07/24/2020 0751   CL 101 07/24/2020 0751   CO2 30 07/24/2020 0751   GLUCOSE 123 (H) 07/24/2020 0751   BUN 16 07/24/2020 0751   CREATININE 0.96 07/24/2020 0751   CREATININE 0.82 03/21/2014 0932   CALCIUM 9.0 07/24/2020 0751  GFRNONAA >60 10/16/2015 0959   GFRAA >60 10/16/2015 0959    Lipid Panel     Component Value Date/Time   CHOL 118 07/24/2020 0751   TRIG 49.0 07/24/2020 0751   HDL 40.60 07/24/2020 0751   CHOLHDL 3 07/24/2020 0751   VLDL 9.8 07/24/2020 0751   LDLCALC 67 07/24/2020 0751    CBC    Component Value Date/Time   WBC 11.8 (H) 07/24/2020 0751   RBC 5.29 07/24/2020 0751   HGB 16.1 07/24/2020 0751   HCT 48.7 07/24/2020 0751   PLT 317.0 07/24/2020 0751   MCV 92.2 07/24/2020 0751   MCH 31.0 10/16/2015 0959   MCHC 33.1 07/24/2020 0751   RDW 12.9 07/24/2020 0751   LYMPHSABS 2.5 07/24/2020 0751   MONOABS 1.1 (H) 07/24/2020 0751   EOSABS 0.2 07/24/2020 0751   BASOSABS 0.1 07/24/2020 0751    Hgb A1C Lab Results  Component Value Date   HGBA1C 6.1 (H) 03/08/2013           Assessment & Plan:   Thoracic Back Pain, Chest Pressure:  Indications for ECG: Chest pressure Interpretation of ECG: Normal rate, rhythm. No acute findings Comparison of ECG: 10/2015, unchanged Toradol 30 mg IM today Chest xray today Xray thoracic spine today RX for Flexeril 10 mg TID- sedation caution given  Return precautions discussed, will follow up after xrays  Nicki Reaper, NP This visit occurred during the SARS-CoV-2 public health emergency.  Safety protocols were in place, including screening questions prior to the visit, additional usage of staff PPE, and extensive cleaning of exam room while observing appropriate contact time as indicated for disinfecting solutions.

## 2020-08-16 NOTE — Addendum Note (Signed)
Addended by: Roena Malady on: 08/16/2020 04:39 PM   Modules accepted: Orders

## 2020-08-16 NOTE — Patient Instructions (Signed)
Acute Back Pain, Adult Acute back pain is sudden and usually short-lived. It is often caused by an injury to the muscles and tissues in the back. The injury may result from:  A muscle or ligament getting overstretched or torn (strained). Ligaments are tissues that connect bones to each other. Lifting something improperly can cause a back strain.  Wear and tear (degeneration) of the spinal disks. Spinal disks are circular tissue that provides cushioning between the bones of the spine (vertebrae).  Twisting motions, such as while playing sports or doing yard work.  A hit to the back.  Arthritis. You may have a physical exam, lab tests, and imaging tests to find the cause of your pain. Acute back pain usually goes away with rest and home care. Follow these instructions at home: Managing pain, stiffness, and swelling  Take over-the-counter and prescription medicines only as told by your health care provider.  Your health care provider may recommend applying ice during the first 24-48 hours after your pain starts. To do this: ? Put ice in a plastic bag. ? Place a towel between your skin and the bag. ? Leave the ice on for 20 minutes, 2-3 times a day.  If directed, apply heat to the affected area as often as told by your health care provider. Use the heat source that your health care provider recommends, such as a moist heat pack or a heating pad. ? Place a towel between your skin and the heat source. ? Leave the heat on for 20-30 minutes. ? Remove the heat if your skin turns bright red. This is especially important if you are unable to feel pain, heat, or cold. You have a greater risk of getting burned. Activity   Do not stay in bed. Staying in bed for more than 1-2 days can delay your recovery.  Sit up and stand up straight. Avoid leaning forward when you sit, or hunching over when you stand. ? If you work at a desk, sit close to it so you do not need to lean over. Keep your chin tucked  in. Keep your neck drawn back, and keep your elbows bent at a right angle. Your arms should look like the letter "L." ? Sit high and close to the steering wheel when you drive. Add lower back (lumbar) support to your car seat, if needed.  Take short walks on even surfaces as soon as you are able. Try to increase the length of time you walk each day.  Do not sit, drive, or stand in one place for more than 30 minutes at a time. Sitting or standing for long periods of time can put stress on your back.  Do not drive or use heavy machinery while taking prescription pain medicine.  Use proper lifting techniques. When you bend and lift, use positions that put less stress on your back: ? Bend your knees. ? Keep the load close to your body. ? Avoid twisting.  Exercise regularly as told by your health care provider. Exercising helps your back heal faster and helps prevent back injuries by keeping muscles strong and flexible.  Work with a physical therapist to make a safe exercise program, as recommended by your health care provider. Do any exercises as told by your physical therapist. Lifestyle  Maintain a healthy weight. Extra weight puts stress on your back and makes it difficult to have good posture.  Avoid activities or situations that make you feel anxious or stressed. Stress and anxiety increase muscle   tension and can make back pain worse. Learn ways to manage anxiety and stress, such as through exercise. General instructions  Sleep on a firm mattress in a comfortable position. Try lying on your side with your knees slightly bent. If you lie on your back, put a pillow under your knees.  Follow your treatment plan as told by your health care provider. This may include: ? Cognitive or behavioral therapy. ? Acupuncture or massage therapy. ? Meditation or yoga. Contact a health care provider if:  You have pain that is not relieved with rest or medicine.  You have increasing pain going down  into your legs or buttocks.  Your pain does not improve after 2 weeks.  You have pain at night.  You lose weight without trying.  You have a fever or chills. Get help right away if:  You develop new bowel or bladder control problems.  You have unusual weakness or numbness in your arms or legs.  You develop nausea or vomiting.  You develop abdominal pain.  You feel faint. Summary  Acute back pain is sudden and usually short-lived.  Use proper lifting techniques. When you bend and lift, use positions that put less stress on your back.  Take over-the-counter and prescription medicines and apply heat or ice as directed by your health care provider. This information is not intended to replace advice given to you by your health care provider. Make sure you discuss any questions you have with your health care provider. Document Revised: 12/14/2018 Document Reviewed: 04/08/2017 Elsevier Patient Education  2020 [employer]  

## 2020-08-20 DIAGNOSIS — G4733 Obstructive sleep apnea (adult) (pediatric): Secondary | ICD-10-CM | POA: Diagnosis not present

## 2021-02-24 IMAGING — CT CT ABD-PELV W/ CM
2 of 5 series · 15 of 46 positions shown, 17 images · IV contrast (OMNIPAQUE 300)
Comparison: None.

CLINICAL DATA: Left-sided abdominal pain for 5 days. Intermittent
diarrhea and constipation

EXAM:
CT ABDOMEN AND PELVIS WITH CONTRAST
TECHNIQUE: Multidetector CT imaging of the abdomen and pelvis was performed
using the standard protocol following bolus administration of
intravenous contrast. Oral contrast was administered.
CONTRAST:  100mL OMNIPAQUE IOHEXOL 300 MG/ML  SOLN

[Series 2: abd/pel w · axial · 0.92mm/px · z∈[-520,-125]mm · 12 of 91 slices shown, 14 images]
[im 6/91  soft-tissue]
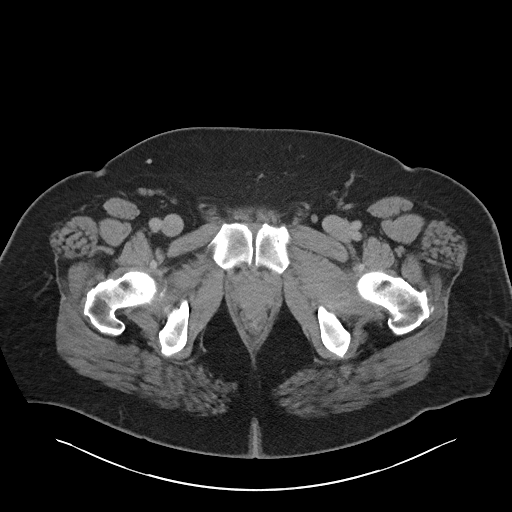
[im 6/91  bone]
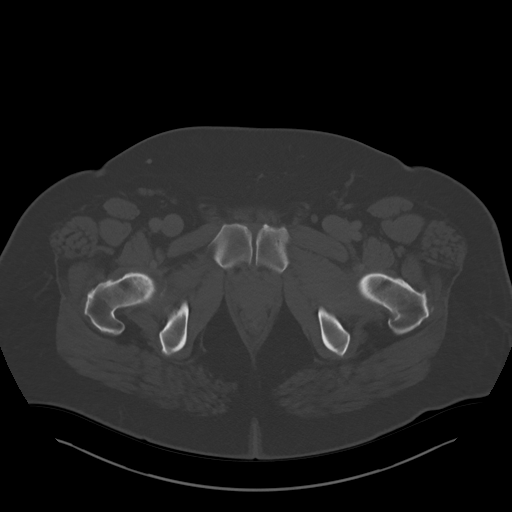
[im 16/91  soft-tissue]
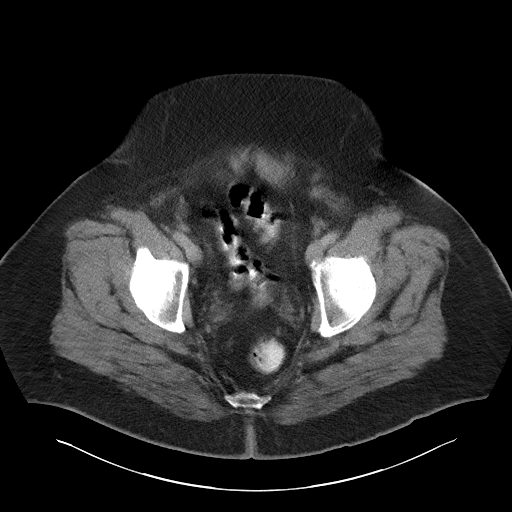
[im 22/91  soft-tissue]
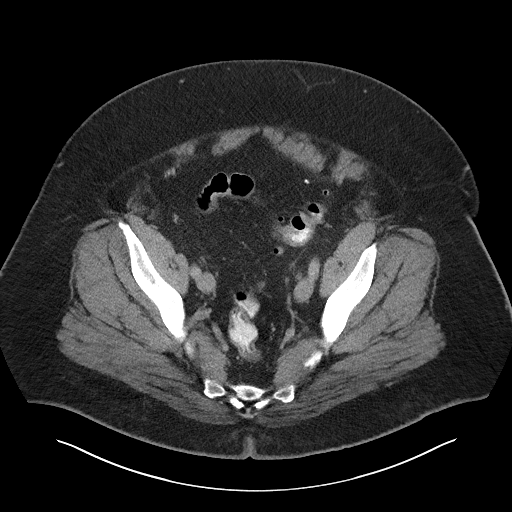
[im 27/91  soft-tissue]
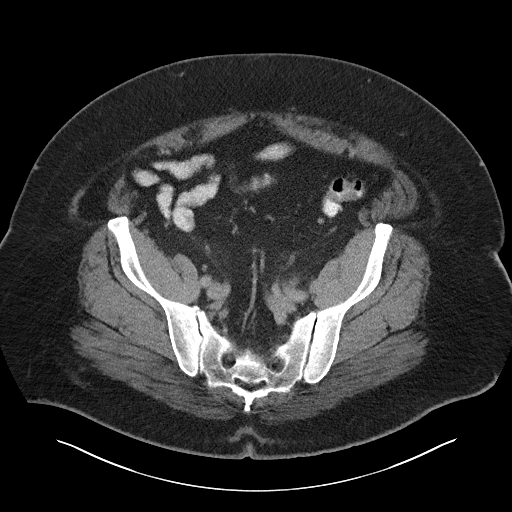
[im 38/91  soft-tissue]
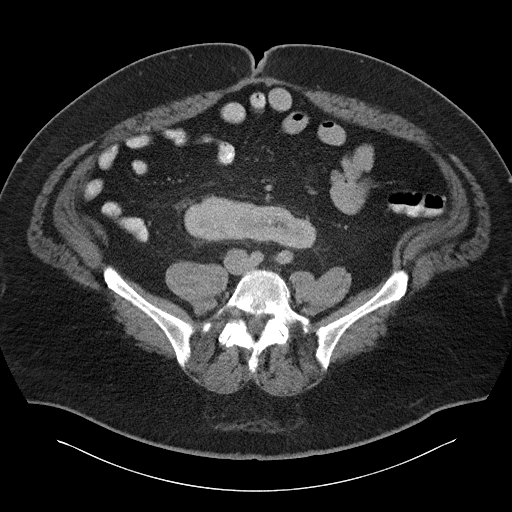
[im 43/91  soft-tissue]
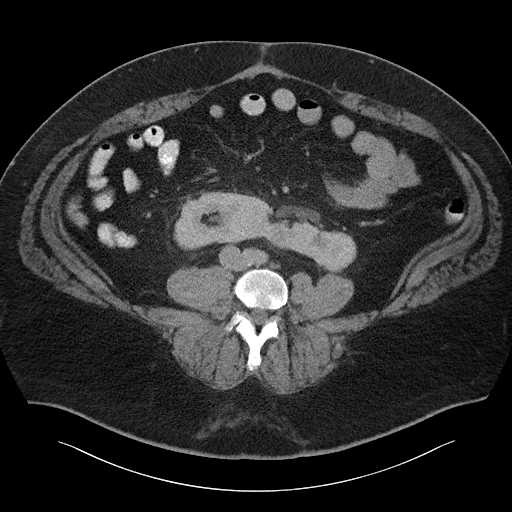
[im 48/91  soft-tissue]
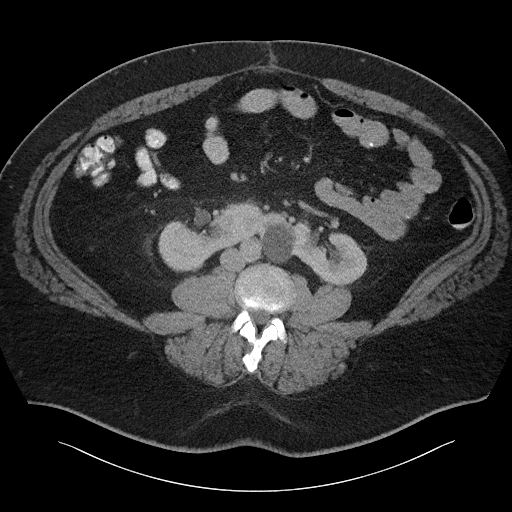
[im 59/91  soft-tissue]
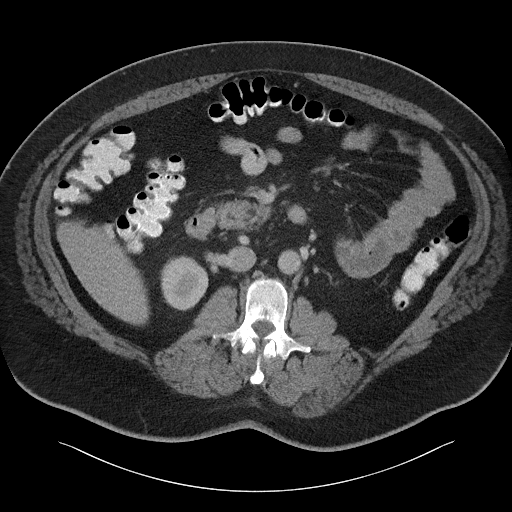
[im 64/91  soft-tissue]
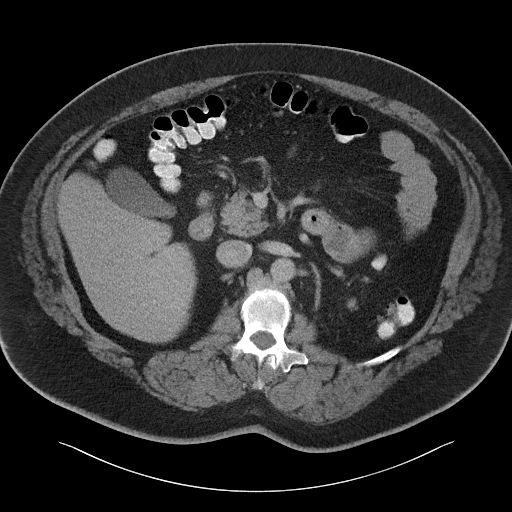
[im 64/91  bone]
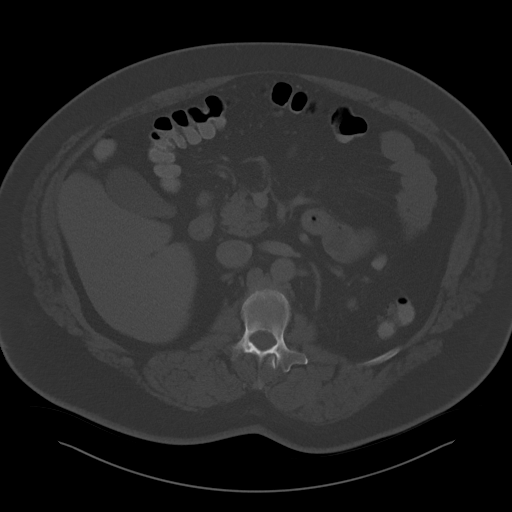
[im 69/91  soft-tissue]
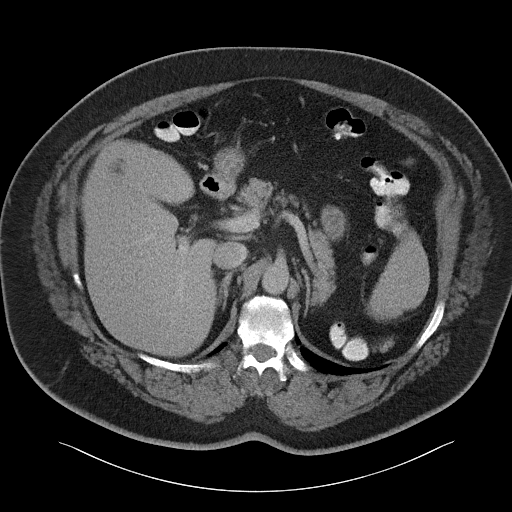
[im 80/91  soft-tissue]
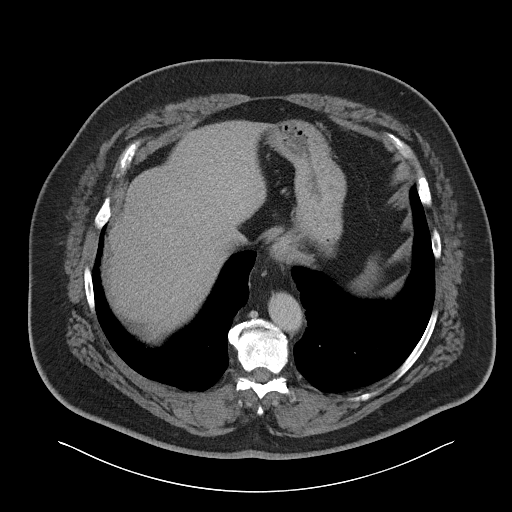
[im 85/91  soft-tissue]
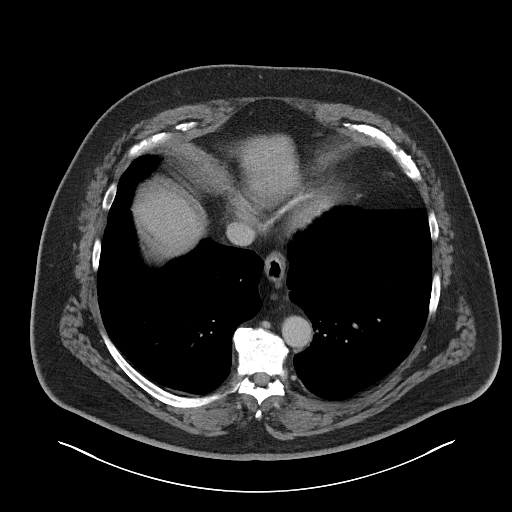

[Series 5: abd/pel w st · coronal · 0.89mm/px · 3 of 114 slices shown]
[im 38/114  soft-tissue]
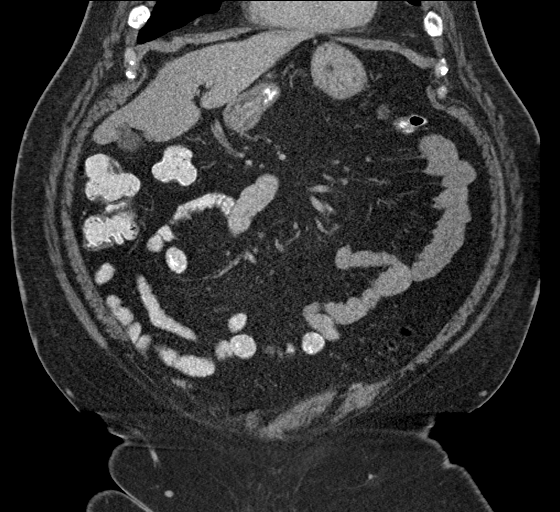
[im 51/114  soft-tissue]
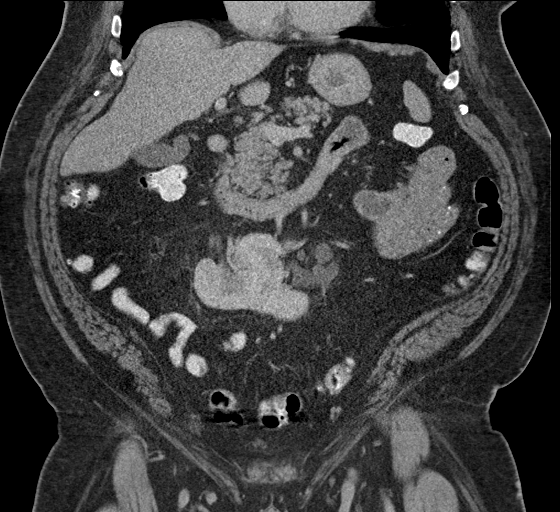
[im 63/114  soft-tissue]
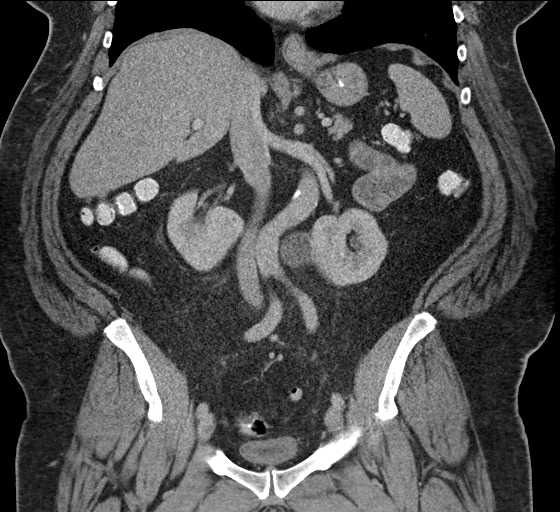

[15 of 46 positions shown; findings below may reference images not displayed]

FINDINGS: Lower chest: There is slight bibasilar atelectasis. Lung bases
otherwise are clear. There is a small hiatal hernia.

Hepatobiliary: No focal liver lesions are evident on this
noncontrast enhanced study. Gallbladder wall is not appreciably
thickened. There is no biliary duct dilatation.

Pancreas: There is no pancreatic mass or inflammatory focus.

Spleen: No splenic lesions are evident.

Adrenals/Urinary Tract: Adrenals bilaterally appear normal. There is
a horseshoe kidney. There is a focal cyst arising at the isthmus
between the right and left portions of the horseshoe kidney
measuring 3.9 x 3.5 cm. There is a cyst arising from the medial
aspect of left moiety of the horseshoe kidney measuring 1.7 x
cm. There is slight hydronephrosis of the right horseshoe kidney
moiety with mild soft tissue stranding in this area. There is
equivocal inhomogeneous enhancement of the right horseshoe kidney
moiety. Note that there is no hydronephrosis in the left moiety.
There is a 2 mm calculus in the mid right renal moiety region
somewhat laterally in location. There is a 4 x 3 mm calculus as well
as a nearby 3 x 3 mm calculus near the isthmus between the left and
right moieties. No evident ureteral calculus on either side. Urinary
bladder wall is thickened diffusely.

Stomach/Bowel: There are sigmoid diverticula without evident
diverticulitis. There is no appreciable bowel wall or mesenteric
thickening. There is no evident bowel obstruction. Terminal ileum
appears normal. There is lipomatous infiltration of the ileocecal
valve. No evident free air or portal venous air.

Vascular/Lymphatic: There is no abdominal aortic aneurysm. Aorta is
tortuous with aortic atherosclerosis. There also foci of
calcification in each common iliac artery. No adenopathy evident in
the abdomen or pelvis.

Reproductive: Prostate and seminal vesicles appear normal in size
and contour. No pelvic mass evident.

Other: Appendix appears unremarkable. No evident abscess or ascites
in the abdomen or pelvis.

Musculoskeletal: There are foci of degenerative change in the lumbar
spine. There is mild wedging of several lower thoracic vertebral
bodies. No blastic or lytic bone lesions are evident. No
intramuscular lesions are appreciable.
IMPRESSION: 1. Horseshoe kidney. Mild hydronephrosis involving the right renal
moiety with mild soft tissue stranding in the right renal moiety
region. Mild inhomogeneous enhancement of the right renal moiety
compared to the left side which appears normal. Suspect a degree of
pyelonephritis involving the right moiety of the horseshoe kidney
with potential early bacterial nephritis. Note that there is no
ureteral calculus on either side. There is no hydronephrosis
involving the left renal moiety. There are calculi in the right
renal moiety as well as in the isthmus region of this horseshoe
kidney.

2. Diffuse wall thickening of the urinary bladder consistent with
cystitis.

3. There are sigmoid diverticula. No diverticulitis evident. No
bowel obstruction. No abscess in the abdomen pelvis. Appendix
appears normal.

4.  Small hiatal hernia.

5.  Aortic Atherosclerosis ([id number]).

These results will be called to the ordering clinician or
representative by the Radiologist Assistant, and communication
documented in the PACS or zVision Dashboard.

## 2021-05-30 ENCOUNTER — Encounter: Payer: Self-pay | Admitting: Family Medicine

## 2021-05-30 ENCOUNTER — Other Ambulatory Visit: Payer: Self-pay

## 2021-05-30 ENCOUNTER — Ambulatory Visit: Payer: BLUE CROSS/BLUE SHIELD | Admitting: Family Medicine

## 2021-05-30 VITALS — BP 140/88 | HR 79 | Temp 98.1°F | Ht 70.5 in | Wt 325.5 lb

## 2021-05-30 DIAGNOSIS — L03012 Cellulitis of left finger: Secondary | ICD-10-CM | POA: Diagnosis not present

## 2021-05-30 DIAGNOSIS — M79645 Pain in left finger(s): Secondary | ICD-10-CM

## 2021-05-30 MED ORDER — CEPHALEXIN 500 MG PO CAPS: 1000.0000 mg | ORAL_CAPSULE | Freq: Two times a day (BID) | ORAL | 0 refills | Status: DC

## 2021-05-30 NOTE — Progress Notes (Signed)
Alex Barradas T. Jovin [name], MD, CAQ Sports Medicine [facility] at [facility] [address] [place], [address]  Phone: [phone]  FAX: [phone]  [name] - 55 y.o. male  MRN [id number]  Date of Birth: November 14, 1965  Date: 05/30/2021  PCP: [name], MD  Referral: [name], MD  Chief Complaint  Patient presents with   Thumb pain    Left    This visit occurred during the SARS-CoV-2 public health emergency.  Safety protocols were in place, including screening questions prior to the visit, additional usage of staff PPE, and extensive cleaning of exam room while observing appropriate contact time as indicated for disinfecting solutions.   Subjective:   Alex Oneal is a 55 y.o. very pleasant male patient with Body mass index is 46.04 kg/m. who presents with the following:  There is a very pleasant young man and he presents with an acute left-sided thumb pain.  I have seen this patient previously about 4 years ago for a different issue.  Medial thumb on the ulnar side of the thumb at the base the patient does have some painful redness and several days ago he did have a large amount of pus come out of this region of the thumb.  He still has had a small amount of pus coming out every day.  He has put on some peroxide as well as some Neosporin.  Paronychia - drained  Review of Systems is noted in the HPI, as appropriate   Objective:   BP 140/88   Pulse 79   Temp 98.1 F (36.7 C) (Temporal)   Ht 5' 10.5" (1.791 m)   Wt (!) 325 lb 8 oz (147.6 kg)   SpO2 97%   BMI 46.04 kg/m     No fluctuance  Radiology: No results found.  Assessment and Plan:     ICD-10-CM   1. Acute paronychia of left thumb  L03.012     2. Pain of left thumb  M79.645      Infected paronychia.  Thankfully, this spontaneously drained, and I am going to place him on some Keflex.  Anticipate that this will do well.  If he does poorly over the weekend,  then transitioning to broader spectrum including MRSA coverage such as doxycycline or Septra would be reasonable.  Meds ordered this encounter  Medications   cephALEXin (KEFLEX) 500 MG capsule    Sig: Take 2 capsules (1,000 mg total) by mouth 2 (two) times daily.    Dispense:  40 capsule    Refill:  0   Medications Discontinued During This Encounter  Medication Reason   cyclobenzaprine (FLEXERIL) 10 MG tablet Completed Course   No orders of the defined types were placed in this encounter.   Follow-up: No follow-ups on file.  [employer] One speech-to-text software was used for transcription in this dictation.  Possible transcriptional errors can occur using Animal nutritionist.   Signed,  Elpidio Galea. Elenore Wanninger, MD   Outpatient Encounter Medications as of 05/30/2021  Medication Sig   albuterol (VENTOLIN HFA) 108 (90 Base) MCG/ACT inhaler Inhale 2 puffs into the lungs every 6 (six) hours as needed for wheezing or shortness of breath. Hold for patient to request later.   budesonide (PULMICORT FLEXHALER) 180 MCG/ACT inhaler Inhale 1 puff into the lungs 2 (two) times daily.   cephALEXin (KEFLEX) 500 MG capsule Take 2 capsules (1,000 mg total) by mouth 2 (two) times daily.   losartan (COZAAR) 100  MG tablet Take 1 tablet (100 mg total) by mouth daily.   rOPINIRole (REQUIP) 0.25 MG tablet Take 1-2 tablets (0.25-0.5 mg total) by mouth at bedtime.   [DISCONTINUED] cyclobenzaprine (FLEXERIL) 10 MG tablet Take 1 tablet (10 mg total) by mouth 3 (three) times daily as needed for muscle spasms.   No facility-administered encounter medications on file as of 05/30/2021.

## 2021-07-14 ENCOUNTER — Other Ambulatory Visit: Payer: Self-pay | Admitting: Family Medicine

## 2021-07-14 DIAGNOSIS — G2581 Restless legs syndrome: Secondary | ICD-10-CM

## 2021-07-14 DIAGNOSIS — I10 Essential (primary) hypertension: Secondary | ICD-10-CM

## 2021-07-14 DIAGNOSIS — R739 Hyperglycemia, unspecified: Secondary | ICD-10-CM

## 2021-07-14 DIAGNOSIS — Z125 Encounter for screening for malignant neoplasm of prostate: Secondary | ICD-10-CM

## 2021-07-25 ENCOUNTER — Other Ambulatory Visit (INDEPENDENT_AMBULATORY_CARE_PROVIDER_SITE_OTHER): Payer: BLUE CROSS/BLUE SHIELD

## 2021-07-25 ENCOUNTER — Other Ambulatory Visit: Payer: Self-pay

## 2021-07-25 DIAGNOSIS — R739 Hyperglycemia, unspecified: Secondary | ICD-10-CM

## 2021-07-25 DIAGNOSIS — G2581 Restless legs syndrome: Secondary | ICD-10-CM

## 2021-07-25 DIAGNOSIS — Z125 Encounter for screening for malignant neoplasm of prostate: Secondary | ICD-10-CM | POA: Diagnosis not present

## 2021-07-25 DIAGNOSIS — I10 Essential (primary) hypertension: Secondary | ICD-10-CM

## 2021-07-25 LAB — COMPREHENSIVE METABOLIC PANEL
ALT: 33 U/L (ref 0–53)
AST: 23 U/L (ref 0–37)
Albumin: 4.1 g/dL (ref 3.5–5.2)
Alkaline Phosphatase: 77 U/L (ref 39–117)
BUN: 18 mg/dL (ref 6–23)
CO2: 28 mEq/L (ref 19–32)
Calcium: 9.4 mg/dL (ref 8.4–10.5)
Chloride: 104 mEq/L (ref 96–112)
Creatinine, Ser: 0.92 mg/dL (ref 0.40–1.50)
GFR: 92.38 mL/min (ref >60.00)
Glucose, Bld: 106 mg/dL — ABNORMAL HIGH (ref 70–99)
Potassium: 4.2 mEq/L (ref 3.5–5.1)
Sodium: 141 mEq/L (ref 135–145)
Total Bilirubin: 0.5 mg/dL (ref 0.2–1.2)
Total Protein: 6.6 g/dL (ref 6.0–8.3)

## 2021-07-25 LAB — LIPID PANEL
Cholesterol: 109 mg/dL (ref 0–200)
HDL: 38.2 mg/dL — ABNORMAL LOW (ref >39.00)
LDL Cholesterol: 60 mg/dL (ref 0–99)
NonHDL: 70.46
Total CHOL/HDL Ratio: 3
Triglycerides: 51 mg/dL (ref 0.0–149.0)
VLDL: 10.2 mg/dL (ref 0.0–40.0)

## 2021-07-25 LAB — CBC WITH DIFFERENTIAL/PLATELET
Basophils Absolute: 0 10*3/uL (ref 0.0–0.1)
Basophils Relative: 0.5 % (ref 0.0–3.0)
Eosinophils Absolute: 0.1 10*3/uL (ref 0.0–0.7)
Eosinophils Relative: 1.5 % (ref 0.0–5.0)
HCT: 46.8 % (ref 39.0–52.0)
Hemoglobin: 15.9 g/dL (ref 13.0–17.0)
Lymphocytes Relative: 20.5 % (ref 12.0–46.0)
Lymphs Abs: 1.7 10*3/uL (ref 0.7–4.0)
MCHC: 33.9 g/dL (ref 30.0–36.0)
MCV: 91 fl (ref 78.0–100.0)
Monocytes Absolute: 0.8 10*3/uL (ref 0.1–1.0)
Monocytes Relative: 9.3 % (ref 3.0–12.0)
Neutro Abs: 5.7 10*3/uL (ref 1.4–7.7)
Neutrophils Relative %: 68.2 % (ref 43.0–77.0)
Platelets: 296 10*3/uL (ref 150.0–400.0)
RBC: 5.14 Mil/uL (ref 4.22–5.81)
RDW: 12.8 % (ref 11.5–15.5)
WBC: 8.4 10*3/uL (ref 4.0–10.5)

## 2021-07-25 LAB — HEMOGLOBIN A1C: Hgb A1c MFr Bld: 6.3 % (ref 4.6–6.5)

## 2021-07-25 LAB — PSA: PSA: 3.54 ng/mL (ref 0.10–4.00)

## 2021-07-30 ENCOUNTER — Encounter: Payer: Self-pay | Admitting: Family Medicine

## 2021-07-30 ENCOUNTER — Ambulatory Visit (INDEPENDENT_AMBULATORY_CARE_PROVIDER_SITE_OTHER): Payer: BLUE CROSS/BLUE SHIELD | Admitting: Family Medicine

## 2021-07-30 ENCOUNTER — Other Ambulatory Visit: Payer: Self-pay

## 2021-07-30 VITALS — BP 122/80 | HR 78 | Temp 98.0°F | Ht 70.5 in | Wt 299.0 lb

## 2021-07-30 DIAGNOSIS — Z23 Encounter for immunization: Secondary | ICD-10-CM | POA: Diagnosis not present

## 2021-07-30 DIAGNOSIS — M7660 Achilles tendinitis, unspecified leg: Secondary | ICD-10-CM

## 2021-07-30 DIAGNOSIS — Z125 Encounter for screening for malignant neoplasm of prostate: Secondary | ICD-10-CM

## 2021-07-30 DIAGNOSIS — Z0000 Encounter for general adult medical examination without abnormal findings: Secondary | ICD-10-CM

## 2021-07-30 DIAGNOSIS — Z7189 Other specified counseling: Secondary | ICD-10-CM

## 2021-07-30 DIAGNOSIS — I10 Essential (primary) hypertension: Secondary | ICD-10-CM

## 2021-07-30 DIAGNOSIS — J4530 Mild persistent asthma, uncomplicated: Secondary | ICD-10-CM

## 2021-07-30 DIAGNOSIS — Z0001 Encounter for general adult medical examination with abnormal findings: Secondary | ICD-10-CM

## 2021-07-30 DIAGNOSIS — R739 Hyperglycemia, unspecified: Secondary | ICD-10-CM

## 2021-07-30 DIAGNOSIS — L989 Disorder of the skin and subcutaneous tissue, unspecified: Secondary | ICD-10-CM

## 2021-07-30 DIAGNOSIS — G2581 Restless legs syndrome: Secondary | ICD-10-CM

## 2021-07-30 MED ORDER — PULMICORT FLEXHALER 180 MCG/ACT IN AEPB
1.0000 | INHALATION_SPRAY | Freq: Two times a day (BID) | RESPIRATORY_TRACT | 12 refills | Status: DC
Start: 2021-07-30 — End: 2021-09-29

## 2021-07-30 MED ORDER — ROPINIROLE HCL 0.25 MG PO TABS
0.2500 mg | ORAL_TABLET | Freq: Every day | ORAL | 1 refills | Status: DC
Start: 2021-07-30 — End: 2022-02-13

## 2021-07-30 MED ORDER — LOSARTAN POTASSIUM 100 MG PO TABS
100.0000 mg | ORAL_TABLET | Freq: Every day | ORAL | 3 refills | Status: DC
Start: 2021-07-30 — End: 2022-08-08

## 2021-07-30 MED ORDER — ALBUTEROL SULFATE HFA 108 (90 BASE) MCG/ACT IN AERS: 2.0000 | INHALATION_SPRAY | Freq: Four times a day (QID) | RESPIRATORY_TRACT | 3 refills | Status: DC | PRN
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
## 2021-07-30 NOTE — Patient Instructions (Addendum)
Don't change your meds for now but if lightheaded then cut the losartan in half.   Take care.  Glad to see you. Update me as needed.   If the chest was spot gets bigger, then I can shave it off.   Use ice for 5 minutes at a time on your ankle and use a heel lift if needed.  Don't go barefoot.

## 2021-08-04 DIAGNOSIS — M7660 Achilles tendinitis, unspecified leg: Secondary | ICD-10-CM | POA: Insufficient documentation

## 2021-08-04 NOTE — Assessment & Plan Note (Signed)
Continue Requip.  He will update me as needed. °

## 2021-08-04 NOTE — Assessment & Plan Note (Signed)
Compliant with pulmicort.  Rinses after use.  Rare SABA use.  No wheeze.  Continue Pulmicort with as needed albuterol.  He will update me as needed.

## 2021-08-04 NOTE — Assessment & Plan Note (Signed)
Labs d/w pt.  Intentional weight loss working with a Psychologist, educational.  He is up to 2 miles for a warm up and then exercising.  He cut back on carbs.  I thanked him for his effort.  If he gets lightheaded at all then cut losartan in half and update me.  He agrees with plan.

## 2021-08-04 NOTE — Assessment & Plan Note (Signed)
Benign-appearing, if he gets larger we can always perform a shave biopsy.  No need to do so now.  No concern for malignancy.  Discussed.  He agrees.

## 2021-08-04 NOTE — Assessment & Plan Note (Signed)
Tetanus 2022 PNA d/w pt.  Shingles d/w pt.   Flu shot 2022 covid vaccine 2021, J&J booster done 07/12/20.  he had unrecalled booster, d/w pt.   Colonoscopy 07/07/2019.   PSA screening d/w pt.  PSA wnl but higher than prev.  FH noted.   Living will d/w pt.  Wife designated if patient were incapacitated.   HIV and HCV screening prev done.

## 2021-08-04 NOTE — Assessment & Plan Note (Signed)
Living will d/w pt.  Wife designated if patient were incapacitated.   ?

## 2021-08-04 NOTE — Assessment & Plan Note (Signed)
Likely bursitis.  Achilles tendon itself nontender.  Normal plantar and dorsiflexion.  Discussed using heel lift and icing as needed.  He has already improved in the meantime, per his report.

## 2021-09-27 ENCOUNTER — Telehealth: Payer: Self-pay | Admitting: Family Medicine

## 2021-09-27 NOTE — Telephone Encounter (Signed)
Pt called wanting to know if there was a replacement or something comparable to the budesonide (PULMICORT FLEXHALER) 180 MCG/ACT inhaler because it has gone up to $150 a month

## 2021-09-27 NOTE — Telephone Encounter (Signed)
Can you start a PA on this?  Or the patient get any information on what else is covered.  Please let me know what you hear.  Thanks.

## 2021-09-27 NOTE — Telephone Encounter (Signed)
Spoke with patient about rx for pulmicort. He stated that insurance is covering the rx but its went up in price. Patient stated he used to pay $50 for the rx but its not went up to $150. Im not sure if theres anything else cheaper for him or not since pricing goes through the pharmacies unless our pharmacists could help him out? Patient states he has about 10-15 days left of his pulmicort.

## 2021-09-29 MED ORDER — FLUTICASONE PROPIONATE HFA 110 MCG/ACT IN AERO: 1.0000 | INHALATION_SPRAY | Freq: Two times a day (BID) | RESPIRATORY_TRACT | 12 refills | Status: DC

## 2021-09-29 NOTE — Telephone Encounter (Signed)
I sent a prescription for Flovent.  That is a reasonable substitution.  If he cannot get that filled then please have him check with his insurance to see what will be covered and let me know.  Thanks.

## 2021-09-29 NOTE — Addendum Note (Signed)
Addended by: [name] on: 09/29/2021 07:31 PM   Modules accepted: Orders

## 2021-09-30 NOTE — Telephone Encounter (Signed)
Spoke with patient and he will be picking up the rx this afternoon. His pharmacy called and let him know it was ready already and also gave him the price which was cheaper as well.

## 2021-10-03 ENCOUNTER — Ambulatory Visit (INDEPENDENT_AMBULATORY_CARE_PROVIDER_SITE_OTHER)
Admission: RE | Admit: 2021-10-03 | Discharge: 2021-10-03 | Disposition: A | Discharge status: Home / Self Care | Payer: BC Managed Care – PPO | Source: Ambulatory Visit | Attending: Family Medicine | Admitting: Family Medicine

## 2021-10-03 ENCOUNTER — Ambulatory Visit: Payer: BLUE CROSS/BLUE SHIELD | Admitting: Family Medicine

## 2021-10-03 ENCOUNTER — Other Ambulatory Visit: Payer: Self-pay

## 2021-10-03 ENCOUNTER — Encounter: Payer: Self-pay | Admitting: Family Medicine

## 2021-10-03 VITALS — BP 124/82 | HR 78 | Temp 97.7°F | Ht 70.5 in | Wt 293.0 lb

## 2021-10-03 DIAGNOSIS — M79606 Pain in leg, unspecified: Secondary | ICD-10-CM

## 2021-10-03 DIAGNOSIS — K130 Diseases of lips: Secondary | ICD-10-CM

## 2021-10-03 DIAGNOSIS — R200 Anesthesia of skin: Secondary | ICD-10-CM | POA: Diagnosis not present

## 2021-10-03 DIAGNOSIS — J4530 Mild persistent asthma, uncomplicated: Secondary | ICD-10-CM | POA: Diagnosis not present

## 2021-10-03 DIAGNOSIS — M545 Low back pain, unspecified: Secondary | ICD-10-CM | POA: Diagnosis not present
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
## 2021-10-03 NOTE — Patient Instructions (Signed)
Let me know if you want to see ENT.  I think that makes sense if it doesn't resolve in the near future.  Go to the lab on the way out.   If you have mychart we'll likely use that to update you.    Take care.  Glad to see you. Let me know about your inhaler situation.

## 2021-10-06 DIAGNOSIS — K130 Diseases of lips: Secondary | ICD-10-CM | POA: Insufficient documentation

## 2021-10-06 NOTE — Assessment & Plan Note (Signed)
With recurrent positional thigh pain.  If he has been sitting for about 30 minutes in the car then he will have symptoms after that.  Then it will gradually get better.  He can get some numbness in the left thigh and left lateral knee with prolonged standing.  No foot drop.  He can still walk a few miles at a time.  He could have separate issues.  He could have meralgia paresthetica causing the numbness laterally on the leg with degenerative changes in his back causing leg pain that is positional.  Reasonable to check plain films.  See notes on imaging.  Okay for outpatient follow-up.

## 2021-10-06 NOTE — Assessment & Plan Note (Signed)
He will let me know if he has trouble getting steroid inhaler coverage with either Asmanex or Flovent.

## 2021-10-06 NOTE — Assessment & Plan Note (Signed)
Recently developed, could have retained foreign body that is working his way to the surface.  Discussed with patient about options.  He wanted to give more time.  If it does not resolve in the near future and we can refer him to ENT.  He will update me as needed.

## 2021-12-06 ENCOUNTER — Telehealth: Payer: Self-pay | Admitting: Family Medicine

## 2021-12-06 NOTE — Telephone Encounter (Signed)
Spoke with patient and he states he saw Dr. Lorelei Pont Sept 2022 for acute paronychia of the left thumb and this has come back. Patient states thumb is red and sore to touch; not having any drainage. Patient states he has tried to push on it to make it drain but nothing will come out. Patient refuses to see anyone here at [place] other then Dr. Damita Dunnings and refuses to go to [facility] or hospital for any kind of eval. I advised patient that Dr. Damita Dunnings is booked out for almost 2 weeks and he needs to be seen but someone in order to get an abx. Patient states if that is the case he will call [place] office to be seen if Dr. Damita Dunnings can not see him. I spoke with Dr. Damita Dunnings about all of this and he wants patient to be seen today at [facility] if possible but patient is refusing. I called patient back and he states he will see what happens with it over the weekend and if its the same or worse he will call [place] office to be seen.  ?

## 2021-12-06 NOTE — Telephone Encounter (Signed)
Pt called in requesting for an antibiotics be  call in for an ongoing issues with his  thumb Please Advise [phone]  ?

## 2021-12-08 NOTE — Telephone Encounter (Signed)
Noted. Thanks.  I will defer to patient.   °

## 2022-02-13 ENCOUNTER — Ambulatory Visit: Payer: BC Managed Care – PPO | Admitting: Family Medicine

## 2022-02-13 ENCOUNTER — Encounter: Payer: Self-pay | Admitting: Family Medicine

## 2022-02-13 VITALS — BP 134/82 | HR 69 | Temp 97.6°F | Ht 70.5 in | Wt 272.1 lb

## 2022-02-13 DIAGNOSIS — R413 Other amnesia: Secondary | ICD-10-CM | POA: Diagnosis not present

## 2022-02-13 DIAGNOSIS — G2581 Restless legs syndrome: Secondary | ICD-10-CM | POA: Diagnosis not present

## 2022-02-13 DIAGNOSIS — Z8042 Family history of malignant neoplasm of prostate: Secondary | ICD-10-CM | POA: Diagnosis not present

## 2022-02-13 DIAGNOSIS — I10 Essential (primary) hypertension: Secondary | ICD-10-CM

## 2022-02-13 DIAGNOSIS — L03019 Cellulitis of unspecified finger: Secondary | ICD-10-CM

## 2022-02-13 DIAGNOSIS — R739 Hyperglycemia, unspecified: Secondary | ICD-10-CM | POA: Diagnosis not present

## 2022-02-13 DIAGNOSIS — R202 Paresthesia of skin: Secondary | ICD-10-CM

## 2022-02-13 DIAGNOSIS — Z125 Encounter for screening for malignant neoplasm of prostate: Secondary | ICD-10-CM | POA: Diagnosis not present

## 2022-02-13 LAB — COMPREHENSIVE METABOLIC PANEL
ALT: 25 U/L (ref 0–53)
AST: 17 U/L (ref 0–37)
Albumin: 3.9 g/dL (ref 3.5–5.2)
Alkaline Phosphatase: 74 U/L (ref 39–117)
BUN: 17 mg/dL (ref 6–23)
CO2: 27 mEq/L (ref 19–32)
Calcium: 9.1 mg/dL (ref 8.4–10.5)
Chloride: 105 mEq/L (ref 96–112)
Creatinine, Ser: 0.72 mg/dL (ref 0.40–1.50)
GFR: 101.06 mL/min (ref >60.00)
Glucose, Bld: 89 mg/dL (ref 70–99)
Potassium: 4.4 mEq/L (ref 3.5–5.1)
Sodium: 140 mEq/L (ref 135–145)
Total Bilirubin: 0.6 mg/dL (ref 0.2–1.2)
Total Protein: 6.2 g/dL (ref 6.0–8.3)

## 2022-02-13 LAB — CBC WITH DIFFERENTIAL/PLATELET
Basophils Absolute: 0.1 10*3/uL (ref 0.0–0.1)
Basophils Relative: 1.3 % (ref 0.0–3.0)
Eosinophils Absolute: 0.2 10*3/uL (ref 0.0–0.7)
Eosinophils Relative: 1.9 % (ref 0.0–5.0)
HCT: 46.3 % (ref 39.0–52.0)
Hemoglobin: 15.5 g/dL (ref 13.0–17.0)
Lymphocytes Relative: 23.9 % (ref 12.0–46.0)
Lymphs Abs: 1.9 10*3/uL (ref 0.7–4.0)
MCHC: 33.4 g/dL (ref 30.0–36.0)
MCV: 93.2 fl (ref 78.0–100.0)
Monocytes Absolute: 0.9 10*3/uL (ref 0.1–1.0)
Monocytes Relative: 10.8 % (ref 3.0–12.0)
Neutro Abs: 5 10*3/uL (ref 1.4–7.7)
Neutrophils Relative %: 62.1 % (ref 43.0–77.0)
Platelets: 265 10*3/uL (ref 150.0–400.0)
RBC: 4.97 Mil/uL (ref 4.22–5.81)
RDW: 13.4 % (ref 11.5–15.5)
WBC: 8.1 10*3/uL (ref 4.0–10.5)

## 2022-02-13 LAB — LIPID PANEL
Cholesterol: 112 mg/dL (ref 0–200)
HDL: 47.1 mg/dL (ref >39.00)
LDL Cholesterol: 57 mg/dL (ref 0–99)
NonHDL: 64.55
Total CHOL/HDL Ratio: 2
Triglycerides: 38 mg/dL (ref 0.0–149.0)
VLDL: 7.6 mg/dL (ref 0.0–40.0)

## 2022-02-13 LAB — VITAMIN B12: Vitamin B-12: 368 pg/mL (ref 211–911)

## 2022-02-13 LAB — TSH: TSH: 2.93 u[IU]/mL (ref 0.35–5.50)

## 2022-02-13 LAB — PSA: PSA: 2.84 ng/mL (ref 0.10–4.00)

## 2022-02-13 LAB — HEMOGLOBIN A1C: Hgb A1c MFr Bld: 5.8 % (ref 4.6–6.5)

## 2022-02-13 MED ORDER — ROPINIROLE HCL 0.25 MG PO TABS: 0.2500 mg | ORAL_TABLET | Freq: Every day | ORAL | Status: DC

## 2022-02-13 NOTE — Progress Notes (Signed)
Prev lip lesion resolved.    L thumb improved in the meantime but he has chronic changes on the nail.  He was able to drain it at home.  D/w pt about rationale for getting him seen when he had the event and not using oral abx, ie needed I&D.  Fortunately does not have any inflammatory changes currently.  Hypertension.  He had intentional weight loss with diet and exercise.  Cautions d/w pt about losartan but he isn't lightheaded.  He has been lifting more weight with exercise, moved from 8 to 20 lbs dumbbells.    Not using requip nightly but used when needed and it helped prev.    No leg pain but he'll have a patch on the L lateral knee that will feel numb/have altered sensation episodically.     If he has been sitting for a prolonged period of time, he'll have trouble getting up.  Usually gets better after a few steps.  No numbness or focal weakness.  Some seats bother him more than others, with back pain in some chairs.  He has pain with prolonged standing that gets better with sitting.  He has been stretching at baseline.  Has been going on for months in spite of home exercise/stretching.  He has no back pain with walking but pain with prolonged stationary standing.    Pain at the interior portion of R shoulder blade.  Worse with more standing.  He can get relief with changing positions.    He had occ/rare R knee pain with walking, not every month but once every 6-8 weeks.    He noted occ memory lapses, word searching.  Still doing well with work.    Some insomnia, waking at 3:30.  Not for LUTS/nocturia.    D/w pt about rechecking PSA.  See orders.    Meds, vitals, and allergies reviewed.   ROS: Per HPI unless specifically indicated in ROS section   GEN: nad, alert and oriented HEENT: ncat NECK: supple w/o LA CV: rrr PULM: ctab, no inc wob ABD: soft, +bs EXT: no edema SKIN: no acute rash Left thumb normal but chronic changes noted on the nail. L lateral leg with absent sensation  to monofilament and altered sensation to light touch.

## 2022-02-13 NOTE — Patient Instructions (Addendum)
Go to the lab on the way out.   If you have mychart we'll likely use that to update you.    It you don't have improvement in back/leg symptoms with improving your posture while standing then let me know and we'll likely set up an MRI of your L spine.   Try taking requip more often and see if that helps with sleep.   Take care.  Glad to see you.

## 2022-02-14 DIAGNOSIS — R739 Hyperglycemia, unspecified: Secondary | ICD-10-CM | POA: Insufficient documentation

## 2022-02-14 DIAGNOSIS — L03019 Cellulitis of unspecified finger: Secondary | ICD-10-CM | POA: Insufficient documentation

## 2022-02-14 DIAGNOSIS — L03012 Cellulitis of left finger: Secondary | ICD-10-CM | POA: Insufficient documentation

## 2022-02-14 DIAGNOSIS — R413 Other amnesia: Secondary | ICD-10-CM | POA: Insufficient documentation

## 2022-02-14 NOTE — Assessment & Plan Note (Signed)
The question is some of his insomnia is related to restless leg symptoms.  Reasonable to try Requip on a scheduled basis for period of time and then update me at that point.  He agrees with plan.

## 2022-02-14 NOTE — Assessment & Plan Note (Signed)
He does not have any ominous findings but it is reasonable to check routine labs.  Rationale discussed with patient.

## 2022-02-14 NOTE — Assessment & Plan Note (Signed)
Recheck PSA pending.  

## 2022-02-14 NOTE — Assessment & Plan Note (Signed)
Orthostatic cautions given to patient, especially with weight loss.  See notes on labs.  Continue losartan as is for now.

## 2022-02-14 NOTE — Assessment & Plan Note (Signed)
He could have spinal stenosis which would explain his symptoms with standing.  He could also have meralgia paresthetica which may or may not be related.  Discussed options.  We talked about getting an MRI of his back at this point.  He can continue with stretching at home.  He is going to work on posture.  If he does not see clear improvement with that then he can let me know and we can see about setting up MRI L-spine.  At this point still okay for outpatient follow-up.

## 2022-02-14 NOTE — Assessment & Plan Note (Signed)
See above, resolved but with chronic residual nail changes.

## 2022-02-14 NOTE — Assessment & Plan Note (Signed)
See notes on labs.  Would expect improvement given weight reduction.  Continue work on diet.

## 2022-03-13 DIAGNOSIS — M9902 Segmental and somatic dysfunction of thoracic region: Secondary | ICD-10-CM | POA: Diagnosis not present

## 2022-03-13 DIAGNOSIS — M5417 Radiculopathy, lumbosacral region: Secondary | ICD-10-CM | POA: Diagnosis not present

## 2022-03-13 DIAGNOSIS — M9905 Segmental and somatic dysfunction of pelvic region: Secondary | ICD-10-CM | POA: Diagnosis not present

## 2022-03-13 DIAGNOSIS — M9903 Segmental and somatic dysfunction of lumbar region: Secondary | ICD-10-CM | POA: Diagnosis not present

## 2022-03-18 DIAGNOSIS — M9903 Segmental and somatic dysfunction of lumbar region: Secondary | ICD-10-CM | POA: Diagnosis not present

## 2022-03-18 DIAGNOSIS — M9905 Segmental and somatic dysfunction of pelvic region: Secondary | ICD-10-CM | POA: Diagnosis not present

## 2022-03-18 DIAGNOSIS — M5417 Radiculopathy, lumbosacral region: Secondary | ICD-10-CM | POA: Diagnosis not present

## 2022-03-20 DIAGNOSIS — M5417 Radiculopathy, lumbosacral region: Secondary | ICD-10-CM | POA: Diagnosis not present

## 2022-03-20 DIAGNOSIS — M9903 Segmental and somatic dysfunction of lumbar region: Secondary | ICD-10-CM | POA: Diagnosis not present

## 2022-03-20 DIAGNOSIS — M9905 Segmental and somatic dysfunction of pelvic region: Secondary | ICD-10-CM | POA: Diagnosis not present

## 2022-03-21 ENCOUNTER — Encounter: Payer: Self-pay | Admitting: Family Medicine

## 2022-03-21 ENCOUNTER — Ambulatory Visit: Payer: BC Managed Care – PPO | Admitting: Family Medicine

## 2022-03-21 VITALS — BP 136/78 | HR 66 | Temp 97.0°F | Ht 70.0 in | Wt 266.4 lb

## 2022-03-21 DIAGNOSIS — L03012 Cellulitis of left finger: Secondary | ICD-10-CM

## 2022-03-21 MED ORDER — CEPHALEXIN 500 MG PO CAPS: 500.0000 mg | ORAL_CAPSULE | Freq: Three times a day (TID) | ORAL | 0 refills | Status: AC

## 2022-03-21 NOTE — Progress Notes (Signed)
Established Patient Office Visit  Subjective   Patient ID: Alex Oneal, male    DOB: 07/22/1966  Age: 56 y.o. MRN: [id number]  Chief Complaint  Patient presents with   Nail Problem    Possible infection on left thumb very painful.     HPI 3-day history of pain and swelling around his left thumbnail.  No discharge.  Distantly recurrent.  Initial episodes seem to come after Alex Oneal had been playing with his dog.    Review of Systems  Constitutional: Negative.  Negative for chills and fever.  HENT: Negative.    Eyes:  Negative for blurred vision, discharge and redness.  Respiratory: Negative.    Cardiovascular: Negative.   Gastrointestinal:  Negative for abdominal pain.  Genitourinary: Negative.   Musculoskeletal: Negative.  Negative for myalgias.  Skin:  Negative for itching and rash.  Neurological:  Negative for tingling, loss of consciousness and weakness.  Endo/Heme/Allergies:  Negative for polydipsia.      Objective:     BP 136/78 (BP Location: Left Arm, Patient Position: Sitting, Cuff Size: Large)   Pulse 66   Temp (!) 97 F (36.1 C) (Temporal)   Ht 5\' 10"  (1.778 m)   Wt 266 lb 6.4 oz (120.8 kg)   SpO2 99%   BMI 38.22 kg/m    Physical Exam Constitutional:      General: Alex Oneal is not in acute distress.    Appearance: Normal appearance. Alex Oneal is not ill-appearing, toxic-appearing or diaphoretic.  HENT:     Head: Normocephalic and atraumatic.     Right Ear: External ear normal.     Left Ear: External ear normal.  Eyes:     General: No scleral icterus.       Right eye: No discharge.        Left eye: No discharge.     Extraocular Movements: Extraocular movements intact.     Conjunctiva/sclera: Conjunctivae normal.  Pulmonary:     Effort: Pulmonary effort is normal. No respiratory distress.  Skin:    General: Skin is warm and dry.       Neurological:     Mental Status: Alex Oneal is alert and oriented to person, place, and time.  Psychiatric:        Mood and Affect:  Mood normal.        Behavior: Behavior normal.   Incision and Drainage Procedure Note  Pre-operative Diagnosis: paronychia  Post-operative Diagnosis: normal  Indications: pain and swell  Anesthesia: ethyl chloride spray  Procedure Details  The procedure, risks and complications have been discussed in detail (including, but not limited to airway compromise, infection, bleeding) with the patient, and the patient gave verbal consent to the procedure.  The skin was sterilely prepped and draped over the affected area in the usual fashion. After adequate local anesthesia, I&D with a #11 blade was performed on the left medial paronychium. Purulent drainage: absent with small bloody serosanguinous fluid. Fluid was cultured.  The patient was observed until stable.  Findings: same  EBL: 0 cc's  Drains:   Condition: Tolerated procedure well   Complications: none.     No results found for any visits on 03/21/22.    The ASCVD Risk score (Arnett DK, et al., 2019) failed to calculate for the following reasons:   The valid total cholesterol range is 130 to 320 mg/dL    Assessment & Plan:   Problem List Items Addressed This Visit       Musculoskeletal and Integument  Paronychia of left thumb - Primary   Relevant Medications   cephALEXin (KEFLEX) 500 MG capsule   Other Relevant Orders   Wound culture    Return if symptoms worsen or fail to improve.  Information was given on paronychia.  Culture sent.  Will start Keflex 500 3 times daily for 10 days.  Will return if there is any problem whatsoever.  Mliss Sax, MD

## 2022-03-24 LAB — WOUND CULTURE
MICRO NUMBER:: 13648696
SPECIMEN QUALITY:: ADEQUATE

## 2022-03-25 DIAGNOSIS — M5417 Radiculopathy, lumbosacral region: Secondary | ICD-10-CM | POA: Diagnosis not present

## 2022-03-25 DIAGNOSIS — M9905 Segmental and somatic dysfunction of pelvic region: Secondary | ICD-10-CM | POA: Diagnosis not present

## 2022-03-25 DIAGNOSIS — M9903 Segmental and somatic dysfunction of lumbar region: Secondary | ICD-10-CM | POA: Diagnosis not present

## 2022-03-27 DIAGNOSIS — M5417 Radiculopathy, lumbosacral region: Secondary | ICD-10-CM | POA: Diagnosis not present

## 2022-03-27 DIAGNOSIS — M9903 Segmental and somatic dysfunction of lumbar region: Secondary | ICD-10-CM | POA: Diagnosis not present

## 2022-03-27 DIAGNOSIS — M9905 Segmental and somatic dysfunction of pelvic region: Secondary | ICD-10-CM | POA: Diagnosis not present

## 2022-04-03 ENCOUNTER — Encounter: Admit: 2022-04-03 | Discharge: 2022-04-03 | Payer: Private Health Insurance - Indemnity

## 2022-04-04 NOTE — Progress Notes
PMH: DM-Metformin; HTN; Afib-Ablation-Eliquis     July 22nd--> fever/chill/sore throat and minimal PO intake  Strep (+) at that time   CT Neck Soft Tissue with contrast--> adenoid swelling, no obvious abscess/fluid collection    Ceftriaxone  Decadron  Tolerating PO-D/C on Augmentin    Last 2d--> return of pain  Gagging with any water intake, so minimal PO   Feels like neck is swollen-aggressive clearing of throat every few minutes  Instant gagging with PO intake  Prefers not  to talk-thumbs up  Talking provokes throat clearing--> evolves into a coughing fit-no stridor     CT Head (-)  CT Neck with soft tissue with contrast-progression of abscess in laryngeal area abscess measuring 2x1 cm with edema throughout neck     On Call ENT--> Deep neck infection with airway swelling--> too high risk with AC  900mg  Clindamycin  Decadron    WBC-15.7 (21.0 on 7/22)  BUN-33  Creat-2.2  1L IVF  Glu-206    VS  BP-115/76  HR-77  RR-17  SpO2-94% on RA  T-98.7  MS: A/O x4

## 2022-04-07 ENCOUNTER — Ambulatory Visit: Payer: BC Managed Care – PPO | Admitting: Family Medicine

## 2022-04-07 ENCOUNTER — Encounter: Payer: Self-pay | Admitting: Family Medicine

## 2022-04-07 VITALS — BP 122/76 | HR 75 | Temp 97.4°F | Ht 70.0 in | Wt 261.8 lb

## 2022-04-07 DIAGNOSIS — S300XXA Contusion of lower back and pelvis, initial encounter: Secondary | ICD-10-CM

## 2022-04-07 DIAGNOSIS — M5432 Sciatica, left side: Secondary | ICD-10-CM

## 2022-04-07 MED ORDER — PREDNISONE 20 MG PO TABS: 40.0000 mg | ORAL_TABLET | Freq: Every day | ORAL | 0 refills | Status: DC

## 2022-04-07 NOTE — Progress Notes (Signed)
[facility] [address] [place] [address] Dept: [phone] Dept Fax: [phone]  Office Visit  Subjective:    Patient ID: Alex Oneal, male    DOB: 1965/12/07, 56 y.o..   MRN: [id number]  Chief Complaint  Patient presents with   Acute Visit    C/o having LT hip x 1 week.  Has been using Aleve with little relief.     History of Present Illness:  Patient is in today complaining of pain in his left buttocks. He notes that about a week ago, he slipped at home while getting on the toilet and landed with his left buttocks on the edge of the porcelain. Since then, he has had significant pain in the left buttocks and radiating down the left posterior leg. He has not noticed any visible bruising. he finds sitting on that side to be painful, and less so when he is up and aroudn. he has been takign regular doses of Aleve without much relief.  Past Medical History: Patient Active Problem List   Diagnosis Date Noted   Paronychia of left thumb 02/14/2022   Hyperglycemia 02/14/2022   Memory change 02/14/2022   Lip lesion 10/06/2021   Achilles bursitis 08/04/2021   RLS (restless legs syndrome) 07/29/2020   Occipital neuralgia 01/03/2020   Horseshoe kidney with renal calculus    PND (paroxysmal nocturnal dyspnea) 03/23/2019   OSA (obstructive sleep apnea) 03/23/2019   Left hip pain 08/16/2018   Skin lesion 05/10/2018   Snoring 05/05/2018   Bloating 11/12/2017   Atherosclerosis of aorta (HCC) 06/14/2017   Pulmonary nodule 06/14/2017   FH: prostate cancer 04/22/2017   Shoulder pain 03/12/2017   Knee clicking 03/12/2017   Abdominal pain 04/18/2016   Paresthesia 04/18/2016   Pain of lower extremity 01/25/2016   Encounter for general adult medical examination with abnormal findings 04/11/2015   Advance care planning 04/11/2015   Back pain 03/02/2015   Abnormal biliary HIDA scan 10/24/2014   Gastroesophageal reflux disease  without esophagitis 03/21/2014   Family history of colon cancer in mother 03/21/2014   Seasonal allergic rhinitis 12/14/2013   Asthma, mild persistent 09/23/2011   Morbid obesity (HCC) 09/19/2011   Hypertension 09/19/2011   Past Surgical History:  Procedure Laterality Date   COLONOSCOPY W/ POLYPECTOMY     INSERTION OF MESH N/A 10/18/2015   Procedure: INSERTION OF MESH;  Surgeon: Harriette Bouillon, MD;  Location: [facility] OR;  Service: General;  Laterality: N/A;   TONSILLECTOMY     VENTRAL HERNIA REPAIR N/A 10/18/2015   Procedure: HERNIA REPAIR VENTRAL ADULT;  Surgeon: Harriette Bouillon, MD;  Location: [facility] OR;  Service: General;  Laterality: N/A;   Family History  Problem Relation Age of Onset   Arthritis Mother    Heart disease Mother 88   Cancer Mother        colon, breast, ovarian   Allergies Mother    Kidney failure Mother    Kidney disease Mother        died of renal failure   Colon cancer Mother    Arthritis Father    Hypertension Father    Allergies Father    Asthma Father    Parkinsonism Father        possible environmental causes   Arthritis Paternal Aunt    Cancer Paternal Aunt    Arthritis Paternal Uncle    Cancer Paternal Uncle    Arthritis Paternal Grandmother    Arthritis Paternal Grandfather  Cancer Paternal Grandfather    Stroke Paternal Grandfather    Allergies Brother    Kidney failure Maternal Grandfather    Prostate cancer Maternal Grandfather    Aneurysm Neg Hx    Outpatient Medications Prior to Visit  Medication Sig Dispense Refill   albuterol (VENTOLIN HFA) 108 (90 Base) MCG/ACT inhaler Inhale 2 puffs into the lungs every 6 (six) hours as needed for wheezing or shortness of breath. Hold for patient to request later. 18 g 3   fluticasone (FLOVENT HFA) 110 MCG/ACT inhaler Inhale 1 puff into the lungs in the morning and at bedtime. Rinse after use. 1 each 12   losartan (COZAAR) 100 MG tablet Take 1 tablet (100 mg total) by mouth daily. 90 tablet 3   rOPINIRole  (REQUIP) 0.25 MG tablet Take 1-2 tablets (0.25-0.5 mg total) by mouth at bedtime. As needed     No facility-administered medications prior to visit.   Allergies  Allergen Reactions   Fire Ant Other (See Comments)    Significant local reaction but no lip swelling.     Gabapentin Other (See Comments)    Pt states that he gets dizzy when he gets out of bed while on this   Vioxx [Rofecoxib] Other (See Comments)    GI upset but not a true allergy     Objective:   Today's Vitals   04/07/22 1423  BP: 122/76  Pulse: 75  Temp: (!) 97.4 F (36.3 C)  TempSrc: Temporal  SpO2: 98%  Weight: 261 lb 12.8 oz (118.8 kg)  Height: 5\' 10"  (1.778 m)   Body mass index is 37.56 kg/m.   General: Well developed, well nourished. No acute distress. Buttocks: Pain over the mid lelft gluteal area. No visible bruising. Psych: Alert and oriented. Normal mood and affect.  Health Maintenance Due  Topic Date Due   Zoster Vaccines- Shingrix (1 of 2) Never done   COVID-19 Vaccine (3 - Booster for Janssen series) 09/07/2020      Assessment & Plan:   1. Contusion of buttock, initial encounter 2. Sciatica of left side Symptoms and exam are consistent with a contusion to the buttocks. I recommend Mr. Morini use heat, try sitting on an inflatable donut pillow, and I will prescribe a course of prednisone for the sciatica component.  - predniSONE (DELTASONE) 20 MG tablet; Take 2 tablets (40 mg total) by mouth daily with breakfast.  Dispense: 10 tablet; Refill: 0   Return if symptoms worsen or fail to improve.   Katrinka Blazing, MD

## 2022-04-23 ENCOUNTER — Emergency Department: Payer: BC Managed Care – PPO

## 2022-04-23 ENCOUNTER — Encounter: Payer: Self-pay | Admitting: Emergency Medicine

## 2022-04-23 ENCOUNTER — Emergency Department
Admission: EM | Admit: 2022-04-23 | Discharge: 2022-04-23 | Disposition: A | Discharge status: Home / Self Care | Payer: BC Managed Care – PPO | Attending: Emergency Medicine | Admitting: Emergency Medicine

## 2022-04-23 DIAGNOSIS — R1031 Right lower quadrant pain: Secondary | ICD-10-CM | POA: Diagnosis not present

## 2022-04-23 DIAGNOSIS — Q631 Lobulated, fused and horseshoe kidney: Secondary | ICD-10-CM | POA: Diagnosis not present

## 2022-04-23 DIAGNOSIS — Z7951 Long term (current) use of inhaled steroids: Secondary | ICD-10-CM | POA: Insufficient documentation

## 2022-04-23 DIAGNOSIS — Z87891 Personal history of nicotine dependence: Secondary | ICD-10-CM | POA: Insufficient documentation

## 2022-04-23 DIAGNOSIS — N132 Hydronephrosis with renal and ureteral calculous obstruction: Secondary | ICD-10-CM | POA: Insufficient documentation

## 2022-04-23 DIAGNOSIS — I700 Atherosclerosis of aorta: Secondary | ICD-10-CM | POA: Diagnosis not present

## 2022-04-23 DIAGNOSIS — Z7952 Long term (current) use of systemic steroids: Secondary | ICD-10-CM | POA: Insufficient documentation

## 2022-04-23 DIAGNOSIS — I10 Essential (primary) hypertension: Secondary | ICD-10-CM | POA: Insufficient documentation

## 2022-04-23 DIAGNOSIS — D72829 Elevated white blood cell count, unspecified: Secondary | ICD-10-CM | POA: Insufficient documentation

## 2022-04-23 DIAGNOSIS — J45909 Unspecified asthma, uncomplicated: Secondary | ICD-10-CM | POA: Diagnosis not present

## 2022-04-23 DIAGNOSIS — Z79899 Other long term (current) drug therapy: Secondary | ICD-10-CM | POA: Insufficient documentation

## 2022-04-23 DIAGNOSIS — N200 Calculus of kidney: Secondary | ICD-10-CM

## 2022-04-23 LAB — URINALYSIS, ROUTINE W REFLEX MICROSCOPIC
Bacteria, UA: NONE SEEN
Bilirubin Urine: NEGATIVE
Glucose, UA: NEGATIVE mg/dL
Ketones, ur: NEGATIVE mg/dL
Leukocytes,Ua: NEGATIVE
Nitrite: NEGATIVE
Protein, ur: NEGATIVE mg/dL
Specific Gravity, Urine: 1.012 (ref 1.005–1.030)
Squamous Epithelial / HPF: NONE SEEN (ref 0–5)
pH: 5 (ref 5.0–8.0)

## 2022-04-23 LAB — CBC WITH DIFFERENTIAL/PLATELET
Abs Immature Granulocytes: 0.05 10*3/uL (ref 0.00–0.07)
Basophils Absolute: 0.1 10*3/uL (ref 0.0–0.1)
Basophils Relative: 1 %
Eosinophils Absolute: 0.3 10*3/uL (ref 0.0–0.5)
Eosinophils Relative: 2 %
HCT: 46.9 % (ref 39.0–52.0)
Hemoglobin: 15.4 g/dL (ref 13.0–17.0)
Immature Granulocytes: 0 %
Lymphocytes Relative: 16 %
Lymphs Abs: 2 10*3/uL (ref 0.7–4.0)
MCH: 30.7 pg (ref 26.0–34.0)
MCHC: 32.8 g/dL (ref 30.0–36.0)
MCV: 93.6 fL (ref 80.0–100.0)
Monocytes Absolute: 1.2 10*3/uL — ABNORMAL HIGH (ref 0.1–1.0)
Monocytes Relative: 9 %
Neutro Abs: 9.5 10*3/uL — ABNORMAL HIGH (ref 1.7–7.7)
Neutrophils Relative %: 72 %
Platelets: 271 10*3/uL (ref 150–400)
RBC: 5.01 MIL/uL (ref 4.22–5.81)
RDW: 12.6 % (ref 11.5–15.5)
WBC: 13 10*3/uL — ABNORMAL HIGH (ref 4.0–10.5)
nRBC: 0 % (ref 0.0–0.2)

## 2022-04-23 LAB — COMPREHENSIVE METABOLIC PANEL
ALT: 30 U/L (ref 0–44)
AST: 21 U/L (ref 15–41)
Albumin: 3.9 g/dL (ref 3.5–5.0)
Alkaline Phosphatase: 79 U/L (ref 38–126)
Anion gap: 4 — ABNORMAL LOW (ref 5–15)
BUN: 23 mg/dL — ABNORMAL HIGH (ref 6–20)
CO2: 26 mmol/L (ref 22–32)
Calcium: 9 mg/dL (ref 8.9–10.3)
Chloride: 109 mmol/L (ref 98–111)
Creatinine, Ser: 0.97 mg/dL (ref 0.61–1.24)
GFR, Estimated: >60 mL/min (ref >60)
Glucose, Bld: 112 mg/dL — ABNORMAL HIGH (ref 70–99)
Potassium: 3.8 mmol/L (ref 3.5–5.1)
Sodium: 139 mmol/L (ref 135–145)
Total Bilirubin: 0.7 mg/dL (ref 0.3–1.2)
Total Protein: 7.1 g/dL (ref 6.5–8.1)

## 2022-04-23 MED ORDER — OXYCODONE-ACETAMINOPHEN 5-325 MG PO TABS: 2.0000 | ORAL_TABLET | Freq: Four times a day (QID) | ORAL | 0 refills | Status: DC | PRN

## 2022-04-23 MED ORDER — IBUPROFEN 800 MG PO TABS: 800.0000 mg | ORAL_TABLET | Freq: Three times a day (TID) | ORAL | 0 refills | Status: DC | PRN

## 2022-04-23 MED ORDER — TAMSULOSIN HCL 0.4 MG PO CAPS: 0.4000 mg | ORAL_CAPSULE | Freq: Every day | ORAL | 0 refills | Status: DC

## 2022-04-23 MED ORDER — OXYCODONE HCL 5 MG PO TABS: 5.0000 mg | ORAL_TABLET | Freq: Four times a day (QID) | ORAL | 0 refills | Status: DC | PRN

## 2022-04-23 MED ORDER — OXYCODONE-ACETAMINOPHEN 5-325 MG PO TABS
2.0000 | ORAL_TABLET | Freq: Once | ORAL | Status: AC
  Administered 2022-04-23: 2 via ORAL
  Filled 2022-04-23: qty 2

## 2022-04-23 MED ORDER — ONDANSETRON 4 MG PO TBDP
4.0000 mg | ORAL_TABLET | Freq: Once | ORAL | Status: AC
  Administered 2022-04-23: 4 mg via ORAL
  Filled 2022-04-23: qty 1

## 2022-04-23 MED ORDER — ONDANSETRON 4 MG PO TBDP: 4.0000 mg | ORAL_TABLET | Freq: Four times a day (QID) | ORAL | 0 refills | Status: DC | PRN

## 2022-04-23 NOTE — ED Provider Notes (Signed)
[facility] Provider Note    Event Date/Time   First MD Initiated Contact with Patient 04/23/22 [phone]     (approximate)   History   Abdominal Pain   HPI  Alex Oneal is a 56 y.o. male with history of hypertension, obesity who presents to the emergency department with right flank pain that radiates into the right lower quadrant that started last night.  States he felt like he had to have a bowel movement but could not.  He has had nausea without vomiting.  No diarrhea.  No dysuria or hematuria.  No previous history of kidney stones.   History provided by patient and wife.    Past Medical History:  Diagnosis Date   Abnormal biliary HIDA scan    Arthritis    Asthma    exercise induced   Former smoker    QUIT 02/2005   Glucose intolerance (impaired glucose tolerance)    Gynecomastia, male    Horseshoe kidney    Hypertension    Obesity    Shortness of breath dyspnea    with activfity    Past Surgical History:  Procedure Laterality Date   COLONOSCOPY W/ POLYPECTOMY     INSERTION OF MESH N/A 10/18/2015   Procedure: INSERTION OF MESH;  Surgeon: Harriette Bouillon, MD;  Location: [facility] OR;  Service: General;  Laterality: N/A;   TONSILLECTOMY     VENTRAL HERNIA REPAIR N/A 10/18/2015   Procedure: HERNIA REPAIR VENTRAL ADULT;  Surgeon: Harriette Bouillon, MD;  Location: [facility] OR;  Service: General;  Laterality: N/A;    MEDICATIONS:  Prior to Admission medications   Medication Sig Start Date End Date Taking? Authorizing Provider  ibuprofen (ADVIL) 800 MG tablet Take 1 tablet (800 mg total) by mouth every 8 (eight) hours as needed. 04/23/22  Yes Jhene Westmoreland, Baxter Hire N, DO  ondansetron (ZOFRAN-ODT) 4 MG disintegrating tablet Take 1 tablet (4 mg total) by mouth every 6 (six) hours as needed for nausea or vomiting. 04/23/22  Yes Tiwatope Emmitt, Layla Maw, DO  oxyCODONE-acetaminophen (PERCOCET/ROXICET) 5-325 MG tablet Take 2 tablets by mouth every 6 (six) hours as needed. 04/23/22  Yes Teigen Parslow,  Baxter Hire N, DO  albuterol (VENTOLIN HFA) 108 (90 Base) MCG/ACT inhaler Inhale 2 puffs into the lungs every 6 (six) hours as needed for wheezing or shortness of breath. Hold for patient to request later. 07/30/21   [name], MD  fluticasone (FLOVENT HFA) 110 MCG/ACT inhaler Inhale 1 puff into the lungs in the morning and at bedtime. Rinse after use. 09/29/21   [name], MD  losartan (COZAAR) 100 MG tablet Take 1 tablet (100 mg total) by mouth daily. 07/30/21   [name], MD  predniSONE (DELTASONE) 20 MG tablet Take 2 tablets (40 mg total) by mouth daily with breakfast. 04/07/22   Loyola Mast, MD  rOPINIRole (REQUIP) 0.25 MG tablet Take 1-2 tablets (0.25-0.5 mg total) by mouth at bedtime. As needed 02/13/22   [name], MD    Physical Exam   Triage Vital Signs: ED Triage Vitals  Enc Vitals Group     BP 04/23/22 0158 (!) 170/106     Pulse Rate 04/23/22 0158 66     Resp 04/23/22 0158 18     Temp 04/23/22 0158 97.9 F (36.6 C)     Temp Source 04/23/22 0158 Oral     SpO2 04/23/22 0158 98 %     Weight 04/23/22 0158 259 lb (117.5 kg)  Height 04/23/22 0158 5\' 10"  (1.778 m)     Head Circumference --      Peak Flow --      Pain Score 04/23/22 0159 4     Pain Loc --      Pain Edu? --      Excl. in GC? --     Most recent vital signs: Vitals:   04/23/22 0158  BP: (!) 170/106  Pulse: 66  Resp: 18  Temp: 97.9 F (36.6 C)  SpO2: 98%    CONSTITUTIONAL: Alert and oriented and responds appropriately to questions.  Appears uncomfortable but not in distress.  Afebrile.  Nontoxic. HEAD: Normocephalic, atraumatic EYES: Conjunctivae clear, pupils appear equal, sclera nonicteric ENT: normal nose; moist mucous membranes NECK: Supple, normal ROM CARD: RRR; S1 and S2 appreciated; no murmurs, no clicks, no rubs, no gallops RESP: Normal chest excursion without splinting or tachypnea; breath sounds clear and equal bilaterally; no wheezes, no rhonchi, no rales, no  hypoxia or respiratory distress, speaking full sentences ABD/GI: Normal bowel sounds; non-distended; soft, non-tender, no rebound, no guarding, no peritoneal signs BACK: The back appears normal EXT: Normal ROM in all joints; no deformity noted, no edema; no cyanosis SKIN: Normal color for age and race; warm; no rash on exposed skin NEURO: Moves all extremities equally, normal speech PSYCH: The patient's mood and manner are appropriate.   ED Results / Procedures / Treatments   LABS: (all labs ordered are listed, but only abnormal results are displayed) Labs Reviewed  CBC WITH DIFFERENTIAL/PLATELET - Abnormal; Notable for the following components:      Result Value   WBC 13.0 (*)    Neutro Abs 9.5 (*)    Monocytes Absolute 1.2 (*)    All other components within normal limits  COMPREHENSIVE METABOLIC PANEL - Abnormal; Notable for the following components:   Glucose, Bld 112 (*)    BUN 23 (*)    Anion gap 4 (*)    All other components within normal limits  URINALYSIS, ROUTINE W REFLEX MICROSCOPIC - Abnormal; Notable for the following components:   Color, Urine YELLOW (*)    APPearance CLEAR (*)    Hgb urine dipstick LARGE (*)    All other components within normal limits     EKG:   RADIOLOGY: My personal review and interpretation of imaging: CT scan shows a 3 mm distal ureteral stone.  I have personally reviewed all radiology reports.   CT Renal Stone Study  Result Date: 04/23/2022 CLINICAL DATA:  Right-sided flank pain for 2 days, initial encounter EXAM: CT ABDOMEN AND PELVIS WITHOUT CONTRAST TECHNIQUE: Multidetector CT imaging of the abdomen and pelvis was performed following the standard protocol without IV contrast. RADIATION DOSE REDUCTION: This exam was performed according to the departmental dose-optimization program which includes automated exposure control, adjustment of the mA and/or kV according to patient size and/or use of iterative reconstruction technique.  COMPARISON:  10/04/2019 FINDINGS: Lower chest: No acute abnormality. Hepatobiliary: No focal liver abnormality is seen. No gallstones, gallbladder wall thickening, or biliary dilatation. Pancreas: Unremarkable. No pancreatic ductal dilatation or surrounding inflammatory changes. Spleen: Normal in size without focal abnormality. Adrenals/Urinary Tract: Adrenal glands are within normal limits. Horseshoe kidney is noted with multiple nonobstructing renal calculi. The largest of these lies in the midline of the or shoe kidney measuring 5 mm. No obstructive changes are noted on the left half of the horseshoe kidney. Hydronephrosis is noted in the right half secondary to a 3 mm stone in  the distal aspect of the right ureter just above the UVJ. Bladder is within normal limits. Central benign appearing cyst is noted in the midportion of the or shoe kidney measuring 3.8 cm and appears simple in nature. This is stable from the prior exam and no further follow-up is recommended. Stomach/Bowel: Mild diverticular change of the colon is noted without evidence of diverticulitis. No obstructive or inflammatory changes of colon are seen. Small bowel is within normal limits. The appendix is unremarkable. Stomach is unremarkable as well. Vascular/Lymphatic: Aortic atherosclerosis. No enlarged abdominal or pelvic lymph nodes. Reproductive: Prostate is unremarkable. Other: No abdominal wall hernia or abnormality. No abdominopelvic ascites. Musculoskeletal: No acute or significant osseous findings. IMPRESSION: Horseshoe kidney similar to that seen on the prior exam with multiple nonobstructing renal calculi which have increased in both size and number when compared with the prior exam. Right-sided hydronephrosis and hydroureter which extends to just above the ureterovesical junction on the right secondary to a 3 mm distal ureteral stone. Diverticulosis without diverticulitis. No other focal abnormality is noted. Electronically Signed    By: Alcide Clever M.D.   On: 04/23/2022 02:31     PROCEDURES:  Critical Care performed: No     Procedures    IMPRESSION / MDM / ASSESSMENT AND PLAN / ED COURSE  I reviewed the triage vital signs and the nursing notes.    Patient here with right flank pain that radiates into the right lower quadrant.  The patient is on the cardiac monitor to evaluate for evidence of arrhythmia and/or significant heart rate changes.   DIFFERENTIAL DIAGNOSIS (includes but not limited to):   Kidney stone, pyelonephritis, UTI, appendicitis, colitis, diverticulitis   Patient's presentation is most consistent with acute presentation with potential threat to life or bodily function.   PLAN: CBC, CMP, urine, CT renal study obtained from triage.  Patient has slight leukocytosis.  Normal creatinine.  Normal LFTs.  Urine shows blood but no other sign of infection.  CT scan reviewed and interpreted by myself radiologist.  Patient has a horseshoe kidney that is similar to previous imaging.  He does have multiple nonobstructing renal calculi.  He has right-sided hydronephrosis and hydroureter due to a 3 mm stone at the UVJ.  Have offered IV medications but patient states he would like to try oral medication here.  Will reassess after pain medication.  Will provide with urine strainer and urology follow-up.   MEDICATIONS GIVEN IN ED: Medications  oxyCODONE-acetaminophen (PERCOCET/ROXICET) 5-325 MG per tablet 2 tablet (2 tablets Oral Given 04/23/22 0651)  ondansetron (ZOFRAN-ODT) disintegrating tablet 4 mg (4 mg Oral Given 04/23/22 1610)     ED COURSE: Patient reports feeling better.  He also states he is very reassured that his appendix is normal on CT scan.  Will discharge with prescription of pain medication, nausea medication, Flomax.  Discussed return precautions with patient and wife.   At this time, I do not feel there is any life-threatening condition present. I reviewed all nursing notes, vitals,  pertinent previous records.  All lab and urine results, EKGs, imaging ordered have been independently reviewed and interpreted by myself.  I reviewed all available radiology reports from any imaging ordered this visit.  Based on my assessment, I feel the patient is safe to be discharged home without further emergent workup and can continue workup as an outpatient as needed. Discussed all findings, treatment plan as well as usual and customary return precautions.  They verbalize understanding and are comfortable with this  plan.  Outpatient follow-up has been provided as needed.  All questions have been answered.    CONSULTS: Pain well controlled, patient tolerating p.o. without signs of superimposed infection.  No need for emergent urologic consult at this time.   OUTSIDE RECORDS REVIEWED: Reviewed patient's last primary care doctor note on 04/07/2022.       FINAL CLINICAL IMPRESSION(S) / ED DIAGNOSES   Final diagnoses:  Kidney stone     Rx / DC Orders   ED Discharge Orders          Ordered    oxyCODONE-acetaminophen (PERCOCET/ROXICET) 5-325 MG tablet  Every 6 hours PRN        04/23/22 0646    ibuprofen (ADVIL) 800 MG tablet  Every 8 hours PRN        04/23/22 0646    ondansetron (ZOFRAN-ODT) 4 MG disintegrating tablet  Every 6 hours PRN        04/23/22 0646    tamsulosin (FLOMAX) 0.4 MG CAPS capsule  Daily        04/23/22 0714             Note:  This document was prepared using Dragon voice recognition software and may include unintentional dictation errors.   Azaiah Mello, Layla Maw, DO 04/23/22 [phone]

## 2022-04-23 NOTE — ED Triage Notes (Signed)
Pt presents via POV with complaints of RLQ with radiation to the back that started last night. Hx of kidney stones several years ago. Pt endorses "hot flashes" with associated nausea. Denies CP or SOB.

## 2022-04-25 ENCOUNTER — Telehealth: Payer: Self-pay | Admitting: Family Medicine

## 2022-04-25 NOTE — Telephone Encounter (Signed)
Patient called in stating he was in the ER Tuesday night into Wednesday morning for kidney stones. He had some questions regarding this, because at the time he didn't think to ask everything. He would like for you to give him a call when possible. Thank you!

## 2022-04-25 NOTE — Telephone Encounter (Signed)
Called and spoke with patient and discussed his questions and concerns with him. Advised patient if he has any issues with getting the kidney stones to pass we can send him to urology. Patient was okay with that and will call back if needed.

## 2022-08-03 ENCOUNTER — Other Ambulatory Visit: Payer: Self-pay | Admitting: Family Medicine

## 2022-08-03 DIAGNOSIS — Z125 Encounter for screening for malignant neoplasm of prostate: Secondary | ICD-10-CM

## 2022-08-03 DIAGNOSIS — R739 Hyperglycemia, unspecified: Secondary | ICD-10-CM

## 2022-08-03 DIAGNOSIS — I10 Essential (primary) hypertension: Secondary | ICD-10-CM

## 2022-08-03 DIAGNOSIS — G2581 Restless legs syndrome: Secondary | ICD-10-CM

## 2022-08-05 ENCOUNTER — Other Ambulatory Visit (INDEPENDENT_AMBULATORY_CARE_PROVIDER_SITE_OTHER): Payer: BC Managed Care – PPO

## 2022-08-05 DIAGNOSIS — R739 Hyperglycemia, unspecified: Secondary | ICD-10-CM | POA: Diagnosis not present

## 2022-08-05 DIAGNOSIS — I10 Essential (primary) hypertension: Secondary | ICD-10-CM

## 2022-08-05 DIAGNOSIS — Z125 Encounter for screening for malignant neoplasm of prostate: Secondary | ICD-10-CM

## 2022-08-05 DIAGNOSIS — G2581 Restless legs syndrome: Secondary | ICD-10-CM | POA: Diagnosis not present

## 2022-08-05 LAB — CBC WITH DIFFERENTIAL/PLATELET
Basophils Absolute: 0.1 10*3/uL (ref 0.0–0.1)
Basophils Relative: 1.1 % (ref 0.0–3.0)
Eosinophils Absolute: 0.2 10*3/uL (ref 0.0–0.7)
Eosinophils Relative: 2.4 % (ref 0.0–5.0)
HCT: 46.6 % (ref 39.0–52.0)
Hemoglobin: 15.6 g/dL (ref 13.0–17.0)
Lymphocytes Relative: 28.2 % (ref 12.0–46.0)
Lymphs Abs: 2.4 10*3/uL (ref 0.7–4.0)
MCHC: 33.4 g/dL (ref 30.0–36.0)
MCV: 94.4 fl (ref 78.0–100.0)
Monocytes Absolute: 0.9 10*3/uL (ref 0.1–1.0)
Monocytes Relative: 10.7 % (ref 3.0–12.0)
Neutro Abs: 4.9 10*3/uL (ref 1.4–7.7)
Neutrophils Relative %: 57.6 % (ref 43.0–77.0)
Platelets: 277 10*3/uL (ref 150.0–400.0)
RBC: 4.94 Mil/uL (ref 4.22–5.81)
RDW: 12.9 % (ref 11.5–15.5)
WBC: 8.5 10*3/uL (ref 4.0–10.5)

## 2022-08-05 LAB — COMPREHENSIVE METABOLIC PANEL
ALT: 25 U/L (ref 0–53)
AST: 17 U/L (ref 0–37)
Albumin: 4 g/dL (ref 3.5–5.2)
Alkaline Phosphatase: 66 U/L (ref 39–117)
BUN: 21 mg/dL (ref 6–23)
CO2: 29 mEq/L (ref 19–32)
Calcium: 9 mg/dL (ref 8.4–10.5)
Chloride: 105 mEq/L (ref 96–112)
Creatinine, Ser: 0.76 mg/dL (ref 0.40–1.50)
GFR: 99.1 mL/min (ref >60.00)
Glucose, Bld: 97 mg/dL (ref 70–99)
Potassium: 4.5 mEq/L (ref 3.5–5.1)
Sodium: 141 mEq/L (ref 135–145)
Total Bilirubin: 0.5 mg/dL (ref 0.2–1.2)
Total Protein: 6.2 g/dL (ref 6.0–8.3)

## 2022-08-05 LAB — LIPID PANEL
Cholesterol: 124 mg/dL (ref 0–200)
HDL: 45.9 mg/dL (ref >39.00)
LDL Cholesterol: 68 mg/dL (ref 0–99)
NonHDL: 77.64
Total CHOL/HDL Ratio: 3
Triglycerides: 48 mg/dL (ref 0.0–149.0)
VLDL: 9.6 mg/dL (ref 0.0–40.0)

## 2022-08-05 LAB — HEMOGLOBIN A1C: Hgb A1c MFr Bld: 5.7 % (ref 4.6–6.5)

## 2022-08-05 LAB — PSA: PSA: 3.6 ng/mL (ref 0.10–4.00)

## 2022-08-08 ENCOUNTER — Ambulatory Visit: Payer: BC Managed Care – PPO | Admitting: Family Medicine

## 2022-08-08 ENCOUNTER — Encounter: Payer: Self-pay | Admitting: Family Medicine

## 2022-08-08 VITALS — BP 122/80 | HR 75 | Temp 97.4°F | Ht 70.0 in | Wt 253.0 lb

## 2022-08-08 DIAGNOSIS — Z0000 Encounter for general adult medical examination without abnormal findings: Secondary | ICD-10-CM | POA: Diagnosis not present

## 2022-08-08 DIAGNOSIS — Z7189 Other specified counseling: Secondary | ICD-10-CM

## 2022-08-08 DIAGNOSIS — Z23 Encounter for immunization: Secondary | ICD-10-CM | POA: Diagnosis not present

## 2022-08-08 DIAGNOSIS — J4530 Mild persistent asthma, uncomplicated: Secondary | ICD-10-CM

## 2022-08-08 DIAGNOSIS — M546 Pain in thoracic spine: Secondary | ICD-10-CM

## 2022-08-08 DIAGNOSIS — I10 Essential (primary) hypertension: Secondary | ICD-10-CM

## 2022-08-08 DIAGNOSIS — G2581 Restless legs syndrome: Secondary | ICD-10-CM

## 2022-08-08 MED ORDER — FLUTICASONE PROPIONATE HFA 110 MCG/ACT IN AERO: 1.0000 | INHALATION_SPRAY | Freq: Two times a day (BID) | RESPIRATORY_TRACT | 12 refills | Status: DC

## 2022-08-08 MED ORDER — LOSARTAN POTASSIUM 50 MG PO TABS: 50.0000 mg | ORAL_TABLET | Freq: Every day | ORAL | 3 refills | Status: DC

## 2022-08-08 NOTE — Progress Notes (Unsigned)
CPE- See plan.  Routine anticipatory guidance given to patient.  See health maintenance.  The possibility exists that previously documented standard health maintenance information may have been brought forward from a previous encounter into this note.  If needed, that same information has been updated to reflect the current situation based on today's encounter.    Tetanus 2022 PNA d/w pt.  Shingles d/w pt.   Flu shot 2023 covid vaccine 2021, J&J booster done 07/12/20.  he had unrecalled booster, d/w pt.   Colonoscopy 07/07/2019.  d/w pt about f/u.  See AVS.   PSA screening d/w pt.  PSA wnl, variable results but still wnl.   Living will d/w pt.  Wife designated if patient were incapacitated.   HIV and HCV screening prev done.  Hypertension:    Using medication without problems or lightheadedness: intentional weight loss noted and occ lightheaded.  Chest pain with exertion:yes Edema:no Short of breath: no  Requip used PRN, rarely needed but with relief.  Helps with leg movement at night.    Still on flovent about 4 days a week w/o change in sx.  Prn albuterol- rare use.  Discussed trying to taper flovent but increase back to 1 puff BID if any worsening of sx.    Chronic L 1-3 distal fingernail changes.  Could be fungal but has normal nail behind the affected distal region.  Agreed to observe for now.    He isn't having RUQ pain.  Discussed.  Improved with weight loss.  Back pain.  Episodic.  Noted with prolonged standing, ie after 30 min. R lateral T spine, not midline.  H/o renal stones.  He passed some gritty material after imaging on 04/23/22.  No dysuria now.  No radicular sx.    PMH and SH reviewed Meds, vitals, and allergies reviewed.  ROS: Per HPI.  Unless specifically indicated otherwise in HPI, the patient denies:  General: fever. Eyes: acute vision changes ENT: sore throat Cardiovascular: chest pain Respiratory: SOB GI: vomiting GU: dysuria Musculoskeletal: acute back  pain Derm: acute rash Neuro: acute motor dysfunction Psych: worsening mood Endocrine: polydipsia Heme: bleeding Allergy: hayfever  GEN: nad, alert and oriented HEENT: mucous membranes moist NECK: supple w/o LA CV: rrr. PULM: ctab, no inc wob ABD: soft, +bs EXT: no edema SKIN: no acute rash Chronic L 1-3 distal fingernail changes.

## 2022-08-08 NOTE — Patient Instructions (Addendum)
Try to taper flovent slowly but increase back to 1 puff twice a day if any worsening of symptoms.   Please check on follow up with Dr. Kinnie Scales [address] [place], [place] [address]  [phone]   Try heat or ice on your back. Cut back to 50mg  losartan and update me as needed.  Take care.  Glad to see you.

## 2022-08-10 NOTE — Assessment & Plan Note (Signed)
Continue as needed Requip.

## 2022-08-10 NOTE — Assessment & Plan Note (Signed)
Living will d/w pt.  Wife designated if patient were incapacitated.   ?

## 2022-08-10 NOTE — Assessment & Plan Note (Signed)
Discussed trying to taper Flovent but can increase back to 1 puff twice a day if he is needing albuterol or having worsening symptoms.

## 2022-08-10 NOTE — Assessment & Plan Note (Signed)
This sounds muscular and separate from his previous episodes with renal stones.  Discussed using heat or ice and he will update me as needed.

## 2022-08-10 NOTE — Assessment & Plan Note (Signed)
Tetanus 2022 PNA d/w pt.  Shingles d/w pt.   Flu shot 2023 covid vaccine 2021, J&J booster done 07/12/20.  he had unrecalled booster, d/w pt.   Colonoscopy 07/07/2019.  d/w pt about f/u.  See AVS.   PSA screening d/w pt.  PSA wnl, variable results but still wnl.   Living will d/w pt.  Wife designated if patient were incapacitated.   HIV and HCV screening prev done.

## 2022-08-10 NOTE — Assessment & Plan Note (Signed)
Dec losartan to 50mg  a day with routine cautions d/w pt.  He agrees, will update me as needed.  Continue work on diet and exercise.  I thanked him for his effort.

## 2022-08-13 ENCOUNTER — Ambulatory Visit: Payer: BC Managed Care – PPO | Admitting: Primary Care

## 2022-08-13 ENCOUNTER — Encounter: Payer: Self-pay | Admitting: Primary Care

## 2022-08-13 VITALS — BP 148/88 | HR 76 | Temp 98.1°F | Ht 70.0 in

## 2022-08-13 DIAGNOSIS — J029 Acute pharyngitis, unspecified: Secondary | ICD-10-CM | POA: Insufficient documentation

## 2022-08-13 LAB — POCT RAPID STREP A (OFFICE): Rapid Strep A Screen: NEGATIVE

## 2022-08-13 NOTE — Progress Notes (Signed)
Subjective:    Patient ID: Alex Oneal, male    DOB: 10-Jun-1966, 56 y.o.   MRN: [id number]  Sore Throat  Pertinent negatives include no congestion, coughing or headaches.    [name] is a very pleasant 56 y.o. male with a history of asthma, seasonal allergic rhinitis, OSA, GERD who presents today to discuss sore throat.   Symptom onset three evenings ago with a scratchy throat. Yesterday evening he developed increased pain to his throat, "felt like my throat is on fire", and noticed red spots to the back of his throat. He does feel fatigued today.   He denies cough, fevers, body aches, headaches, known sick contacts. He has not tested for Covid-19 infection. He received his flu shot last week.    Review of Systems  Constitutional:  Positive for fatigue. Negative for chills and fever.  HENT:  Positive for sore throat. Negative for congestion and postnasal drip.   Respiratory:  Negative for cough.   Musculoskeletal:  Negative for myalgias.  Neurological:  Negative for headaches.         Past Medical History:  Diagnosis Date   Abnormal biliary HIDA scan    Arthritis    Asthma    exercise induced   Former smoker    QUIT 02/2005   Glucose intolerance (impaired glucose tolerance)    Gynecomastia, male    Horseshoe kidney    Hypertension    Obesity    Shortness of breath dyspnea    with activfity    Social History   Socioeconomic History   Marital status: Married    Spouse name: Alex Oneal   Number of children: 1   Years of education: Not on file   Highest education level: Not on file  Occupational History   Occupation: office manager/outside sales    Employer: [employer]   Tobacco Use   Smoking status: Former    Packs/day: 2.00    Years: 25.00    Total pack years: 50.00    Types: Cigarettes    Quit date: 02/10/2005    Years since quitting: 17.5   Smokeless tobacco: Never  Vaping Use   Vaping Use: Never used  Substance and Sexual Activity    Alcohol use: Yes    Comment: 1 drink a day   Drug use: No   Sexual activity: Yes  Other Topics Concern   Not on file  Social History Narrative   Remarried 1997   Son born 2001   [employer]- office supervisor   Social Determinants of Health   Financial Resource Strain: Not on file  Food Insecurity: Not on file  Transportation Needs: Not on file  Physical Activity: Not on file  Stress: Not on file  Social Connections: Not on file  Intimate Partner Violence: Not on file    Past Surgical History:  Procedure Laterality Date   COLONOSCOPY W/ POLYPECTOMY     INSERTION OF MESH N/A 10/18/2015   Procedure: INSERTION OF MESH;  Surgeon: Harriette Bouillon, MD;  Location: [facility] OR;  Service: General;  Laterality: N/A;   TONSILLECTOMY     VENTRAL HERNIA REPAIR N/A 10/18/2015   Procedure: HERNIA REPAIR VENTRAL ADULT;  Surgeon: Harriette Bouillon, MD;  Location: [facility] OR;  Service: General;  Laterality: N/A;    Family History  Problem Relation Age of Onset   Arthritis Mother    Heart disease Mother 74   Cancer Mother        colon, breast, ovarian  Allergies Mother    Kidney failure Mother    Kidney disease Mother        died of renal failure   Colon cancer Mother    Arthritis Father    Hypertension Father    Allergies Father    Asthma Father    Parkinsonism Father        possible environmental causes   Arthritis Paternal Aunt    Cancer Paternal Aunt    Arthritis Paternal Uncle    Cancer Paternal Uncle    Arthritis Paternal Grandmother    Arthritis Paternal Grandfather    Cancer Paternal Grandfather    Stroke Paternal Grandfather    Allergies Brother    Kidney failure Maternal Grandfather    Prostate cancer Maternal Grandfather    Aneurysm Neg Hx     Allergies  Allergen Reactions   Fire Ant Other (See Comments)    Significant local reaction but no lip swelling.     Gabapentin Other (See Comments)    Pt states that he gets dizzy when he gets out of bed while on this    Vioxx [Rofecoxib] Other (See Comments)    GI upset but not a true allergy    Current Outpatient Medications on File Prior to Visit  Medication Sig Dispense Refill   albuterol (VENTOLIN HFA) 108 (90 Base) MCG/ACT inhaler Inhale 2 puffs into the lungs every 6 (six) hours as needed for wheezing or shortness of breath. Hold for patient to request later. 18 g 3   fluticasone (FLOVENT HFA) 110 MCG/ACT inhaler Inhale 1 puff into the lungs in the morning and at bedtime. Rinse after use. 1 each 12   losartan (COZAAR) 50 MG tablet Take 1 tablet (50 mg total) by mouth daily. 90 tablet 3   rOPINIRole (REQUIP) 0.25 MG tablet Take 1-2 tablets (0.25-0.5 mg total) by mouth at bedtime. As needed     No current facility-administered medications on file prior to visit.    BP (!) 148/88   Pulse 76   Temp 98.1 F (36.7 C) (Temporal)   Ht 5\' 10"  (1.778 m)   SpO2 99%   BMI 36.30 kg/m  Objective:   Physical Exam Constitutional:      Appearance: He is ill-appearing.  HENT:     Right Ear: Tympanic membrane and ear canal normal.     Left Ear: Tympanic membrane and ear canal normal.     Mouth/Throat:     Mouth: Mucous membranes are moist.     Pharynx: Posterior oropharyngeal erythema present. No pharyngeal swelling or oropharyngeal exudate.     Tonsils: No tonsillar exudate or tonsillar abscesses. 0 on the right. 0 on the left.  Eyes:     Conjunctiva/sclera: Conjunctivae normal.  Pulmonary:     Effort: Pulmonary effort is normal.  Skin:    General: Skin is warm and dry.           Assessment & Plan:   Problem List Items Addressed This Visit       Other   Sore throat - Primary    Symptoms and presentation suggest viral etiology.   Rapid strep today negative. He declines Covid-19 testing today.  Discussed conservative treatment including Ibuprofen/Tylenol, fluids, rest, salt water gargles.  Discussed typical duration and outcomes from viral illness and to notify if no  improvement/worsening symptoms after one week. Return precautions provided.       Relevant Orders   POCT rapid strep A (Completed)  Pleas Koch, NP

## 2022-08-13 NOTE — Patient Instructions (Signed)
You can try taking Ibuprofen or Tylenol for pain and swelling to the throat. Ibuprofen will work better for swelling.  Try gargling warm salt water.  Drink plenty of fluids and rest.  It was a pleasure meeting you!

## 2022-08-13 NOTE — Assessment & Plan Note (Addendum)
Symptoms and presentation suggest viral etiology.   Rapid strep today negative. He declines Covid-19 testing today.  Discussed conservative treatment including Ibuprofen/Tylenol, fluids, rest, salt water gargles.  Discussed typical duration and outcomes from viral illness and to notify if no improvement/worsening symptoms after one week. Return precautions provided.

## 2022-08-25 ENCOUNTER — Encounter: Admit: 2022-08-25 | Discharge: 2022-08-25 | Payer: Private Health Insurance - Indemnity

## 2022-11-13 ENCOUNTER — Telehealth: Payer: Self-pay | Admitting: Family Medicine

## 2022-11-13 DIAGNOSIS — Z1211 Encounter for screening for malignant neoplasm of colon: Secondary | ICD-10-CM

## 2022-11-13 NOTE — Telephone Encounter (Signed)
Patient is needing a referral for a colonoscopy. He stated that his last provider has retired. He would like to go somewhere in [place] but [place] is fine also. He just wants the best. Please advise. Thank you!

## 2022-11-14 NOTE — Telephone Encounter (Signed)
I put in the referral for [facility].  They have mult great options.  Let me know if he doesn't get a call about seeing them.  Thanks.

## 2022-11-14 NOTE — Telephone Encounter (Signed)
Spoke with patient and notified about referral. Patient will await call to schedule.

## 2022-11-14 NOTE — Addendum Note (Signed)
Addended by: Tonia Ghent on: 11/14/2022 07:43 AM   Modules accepted: Orders

## 2022-11-25 ENCOUNTER — Other Ambulatory Visit: Payer: Self-pay | Admitting: Family Medicine

## 2022-12-26 ENCOUNTER — Telehealth: Payer: Self-pay | Admitting: Gastroenterology

## 2022-12-26 NOTE — Telephone Encounter (Signed)
Good Morning Dr. Russella Dar,  We have received a referral for this patient from Dr. Crawford Givens for patient to be seen with [place] to have a colonoscopy. Patient has previous history with Dr. Kinnie Scales, having his last colonoscopy back in 2020. Patient is wishing to transfer his care to [place] due to Dr. Kinnie Scales retiring, and I see he has history with you back in 2008. Records are available to view in Epic, please review them at your earliest convenience and advise on scheduling.   Thank You!

## 2022-12-29 NOTE — Telephone Encounter (Signed)
Informed patient and requested records from Dr [facility] office.

## 2023-01-06 NOTE — Telephone Encounter (Signed)
Received records from Dr. Jennye Boroughs office as requested. I scanned them into Media for yo to review.   Thank you

## 2023-01-07 NOTE — Telephone Encounter (Signed)
15 mm tubulovillous adenoma removed in 2020. OK to transfer care. OK for direct colonoscopy.

## 2023-01-09 NOTE — Telephone Encounter (Signed)
Called patient to schedule left voicemail. 

## 2023-01-12 ENCOUNTER — Encounter: Payer: Self-pay | Admitting: Gastroenterology

## 2023-01-28 ENCOUNTER — Ambulatory Visit (AMBULATORY_SURGERY_CENTER): Payer: BC Managed Care – PPO

## 2023-01-28 ENCOUNTER — Encounter: Payer: Self-pay | Admitting: Gastroenterology

## 2023-01-28 VITALS — Ht 70.0 in | Wt 269.2 lb

## 2023-01-28 DIAGNOSIS — Z800 Family history of malignant neoplasm of digestive organs: Secondary | ICD-10-CM

## 2023-01-28 DIAGNOSIS — Z86010 Personal history of colonic polyps: Secondary | ICD-10-CM

## 2023-01-28 NOTE — Progress Notes (Signed)
No egg or soy allergy known to patient  No issues known to pt with past sedation with any surgeries or procedures Patient denies ever being told they had issues or difficulty with intubation  No FH of Malignant Hyperthermia Pt is not on diet pills Pt is not on  home 02  Pt is not on blood thinners  Pt denies issues with constipation  No A fib or A flutter Have any cardiac testing pending--no Pt is ambulatory   Patient's chart reviewed by Cathlyn Parsons CNRA prior to previsit and patient appropriate for the [facility].  Previsit completed and red dot placed by patient's name on their procedure day (on provider's schedule).     PV completed with patient. Requesting Miralax prep as used for his previous procedures. Prep reviewed and instructions sent via MyChart and to PO box. Pt to pick up app prep supplies OTC.

## 2023-02-25 ENCOUNTER — Encounter: Payer: Self-pay | Admitting: Gastroenterology

## 2023-02-25 ENCOUNTER — Ambulatory Visit (AMBULATORY_SURGERY_CENTER): Payer: BC Managed Care – PPO | Admitting: Gastroenterology

## 2023-02-25 VITALS — BP 136/97 | HR 64 | Temp 97.5°F | Resp 12 | Ht 70.0 in | Wt 262.6 lb

## 2023-02-25 DIAGNOSIS — Z09 Encounter for follow-up examination after completed treatment for conditions other than malignant neoplasm: Secondary | ICD-10-CM

## 2023-02-25 DIAGNOSIS — K635 Polyp of colon: Secondary | ICD-10-CM | POA: Diagnosis not present

## 2023-02-25 DIAGNOSIS — D123 Benign neoplasm of transverse colon: Secondary | ICD-10-CM

## 2023-02-25 DIAGNOSIS — Z86010 Personal history of colonic polyps: Secondary | ICD-10-CM

## 2023-02-25 DIAGNOSIS — Z800 Family history of malignant neoplasm of digestive organs: Secondary | ICD-10-CM

## 2023-02-25 DIAGNOSIS — Z1211 Encounter for screening for malignant neoplasm of colon: Secondary | ICD-10-CM | POA: Diagnosis not present

## 2023-02-25 MED ORDER — SODIUM CHLORIDE 0.9 % IV SOLN: 500.0000 mL | Freq: Once | INTRAVENOUS | Status: DC

## 2023-02-25 NOTE — Progress Notes (Signed)
Uneventful anesthetic. Report to pacu rn. Vss. Care resumed by rn. 

## 2023-02-25 NOTE — Progress Notes (Signed)
Called to room to assist during endoscopic procedure.  Patient ID and intended procedure confirmed with present staff. Received instructions for my participation in the procedure from the performing physician.  

## 2023-02-25 NOTE — Op Note (Signed)
[facility] Patient Name: Alex Oneal Procedure Date: 02/25/2023 8:17 AM MRN: [id number] Endoscopist: Meryl Dare , MD, [phone] Age: 57 Referring MD:  Date of Birth: [birth year] Gender: Male Account #: [id number] Procedure:                Colonoscopy Indications:              Surveillance: Personal history of adenomatous                            polyps on last colonoscopy > 3 years ago, Family                            history of colon cancer, 1st-degree relative Medicines:                Monitored Anesthesia Care Procedure:                Pre-Anesthesia Assessment:                           - Prior to the procedure, a History and Physical                            was performed, and patient medications and                            allergies were reviewed. The patient's tolerance of                            previous anesthesia was also reviewed. The risks                            and benefits of the procedure and the sedation                            options and risks were discussed with the patient.                            All questions were answered, and informed consent                            was obtained. Prior Anticoagulants: The patient has                            taken no anticoagulant or antiplatelet agents. ASA                            Grade Assessment: II - A patient with mild systemic                            disease. After reviewing the risks and benefits,                            the patient was deemed in satisfactory condition to  undergo the procedure.                           After obtaining informed consent, the colonoscope                            was passed under direct vision. Throughout the                            procedure, the patient's blood pressure, pulse, and                            oxygen saturations were monitored continuously. The                            Olympus CF-[id number] SN  [id number] was introduced through                            the anus and advanced to the the cecum, identified                            by appendiceal orifice and ileocecal valve. The                            ileocecal valve, appendiceal orifice, and rectum                            were photographed. The quality of the bowel                            preparation was good. The colonoscopy was performed                            without difficulty. The patient tolerated the                            procedure well. Scope In: 8:27:24 AM Scope Out: 8:44:46 AM Scope Withdrawal Time: 0 hours 14 minutes 59 seconds  Total Procedure Duration: 0 hours 17 minutes 22 seconds  Findings:                 The perianal and digital rectal examinations were                            normal.                           A 6 mm polyp was found in the transverse colon. The                            polyp was sessile. The polyp was removed with a                            cold snare. Resection and retrieval were complete.  Multiple medium-mouthed and small-mouthed                            diverticula were found in the left colon. There was                            no evidence of diverticular bleeding.                           External and internal hemorrhoids were found during                            retroflexion. The hemorrhoids were small and Grade                            I (internal hemorrhoids that do not prolapse).                           The exam was otherwise without abnormality on                            direct and retroflexion views. Complications:            No immediate complications. Estimated blood loss:                            None. Estimated Blood Loss:     Estimated blood loss: none. Impression:               - One 6 mm polyp in the transverse colon, removed                            with a cold snare. Resected and retrieved.                            - Mild diverticulosis in the left colon.                           - External and internal hemorrhoids.                           - The examination was otherwise normal on direct                            and retroflexion views. Recommendation:           - Repeat colonoscopy, likely 5 years, after studies                            are complete for surveillance based on pathology                            results.                           - Patient has a contact number available for  emergencies. The signs and symptoms of potential                            delayed complications were discussed with the                            patient. Return to normal activities tomorrow.                            Written discharge instructions were provided to the                            patient.                           - High fiber diet.                           - Continue present medications.                           - Await pathology results. Meryl Dare, MD 02/25/2023 8:48:18 AM This report has been signed electronically.

## 2023-02-25 NOTE — Patient Instructions (Addendum)
High fiber diet. Continue present medications. Await pathology results.  Please read over handouts about polyps, hemorrhoids, diverticulosis and high fiber diets   YOU HAD AN ENDOSCOPIC PROCEDURE TODAY AT THE [facility]:   Refer to the procedure report that was given to you for any specific questions about what was found during the examination.  If the procedure report does not answer your questions, please call your gastroenterologist to clarify.  If you requested that your care partner not be given the details of your procedure findings, then the procedure report has been included in a sealed envelope for you to review at your convenience later.  YOU SHOULD EXPECT: Some feelings of bloating in the abdomen. Passage of more gas than usual.  Walking can help get rid of the air that was put into your GI tract during the procedure and reduce the bloating. If you had a lower endoscopy (such as a colonoscopy or flexible sigmoidoscopy) you may notice spotting of blood in your stool or on the toilet paper. If you underwent a bowel prep for your procedure, you may not have a normal bowel movement for a few days.  Please Note:  You might notice some irritation and congestion in your nose or some drainage.  This is from the oxygen used during your procedure.  There is no need for concern and it should clear up in a day or so.  SYMPTOMS TO REPORT IMMEDIATELY:  Following lower endoscopy (colonoscopy or flexible sigmoidoscopy):  Excessive amounts of blood in the stool  Significant tenderness or worsening of abdominal pains  Swelling of the abdomen that is new, acute  Fever of 100F or higher  For urgent or emergent issues, a gastroenterologist can be reached at any hour by calling [phone]. Do not use MyChart messaging for urgent concerns.    DIET:  We do recommend a small meal at first, but then you may proceed to your regular diet.  Drink plenty of fluids but you should avoid  alcoholic beverages for 24 hours.  ACTIVITY:  You should plan to take it easy for the rest of today and you should NOT DRIVE or use heavy machinery until tomorrow (because of the sedation medicines used during the test).    FOLLOW UP: Our staff will call the number listed on your records the next business day following your procedure.  We will call around 7:15- 8:00 am to check on you and address any questions or concerns that you may have regarding the information given to you following your procedure. If we do not reach you, we will leave a message.     If any biopsies were taken you will be contacted by phone or by letter within the next 1-3 weeks.  Please call us at [phone] if you have not heard about the biopsies in 3 weeks.    SIGNATURES/CONFIDENTIALITY: You and/or your care partner have signed paperwork which will be entered into your electronic medical record.  These signatures attest to the fact that that the information above on your After Visit Summary has been reviewed and is understood.  Full responsibility of the confidentiality of this discharge information lies with you and/or your care-partner.

## 2023-02-25 NOTE — Progress Notes (Signed)
Pt's states no medical or surgical changes since previsit or office visit. 

## 2023-02-25 NOTE — Progress Notes (Signed)
History & Physical  Primary Care Physician:  [name], MD Primary Gastroenterologist: Claudette Head, MD  Impression / Plan:  Personal history of tubulovillous adenoma in 2020, family history of colon cancer, first-degree relative, for colonoscopy.  CHIEF COMPLAINT:  [facility], Personal history of colon polyps   HPI: Alex Oneal is a 57 y.o. male Personal history of tubulovillous adenoma in 2020, family history of colon cancer, first-degree relative, for colonoscopy.    Past Medical History:  Diagnosis Date   Abnormal biliary HIDA scan    Arthritis    Asthma    exercise induced   Former smoker    QUIT 02/2005   Glucose intolerance (impaired glucose tolerance)    Gynecomastia, male    Horseshoe kidney    Hypertension    Obesity    Shortness of breath dyspnea    with activfity   Sleep apnea     Past Surgical History:  Procedure Laterality Date   COLONOSCOPY     COLONOSCOPY W/ POLYPECTOMY     INSERTION OF MESH N/A 10/18/2015   Procedure: INSERTION OF MESH;  Surgeon: Harriette Bouillon, MD;  Location: [facility] OR;  Service: General;  Laterality: N/A;   TONSILLECTOMY     VENTRAL HERNIA REPAIR N/A 10/18/2015   Procedure: HERNIA REPAIR VENTRAL ADULT;  Surgeon: Harriette Bouillon, MD;  Location: [facility] OR;  Service: General;  Laterality: N/A;    Prior to Admission medications   Medication Sig Start Date End Date Taking? Authorizing Provider  albuterol (VENTOLIN HFA) 108 (90 Base) MCG/ACT inhaler INHALE 2 PUFFS BY MOUTH EVERY 6 HOURS AS NEEDED FOR WHEEZING OR  SHORTNESS  OF  BREATH 11/25/22  Yes [name], MD  losartan (COZAAR) 50 MG tablet Take 1 tablet (50 mg total) by mouth daily. 08/08/22  Yes [name], MD  fluticasone (FLOVENT HFA) 110 MCG/ACT inhaler Inhale 1 puff into the lungs in the morning and at bedtime. Rinse after use. Patient not taking: Reported on 01/28/2023 08/08/22   [name], MD  rOPINIRole (REQUIP) 0.25 MG tablet Take 1-2 tablets (0.25-0.5 mg total)  by mouth at bedtime. As needed Patient taking differently: Take 0.25-0.5 mg by mouth at bedtime as needed (Restless legs). As needed 02/13/22   [name], MD    Current Outpatient Medications  Medication Sig Dispense Refill   albuterol (VENTOLIN HFA) 108 (90 Base) MCG/ACT inhaler INHALE 2 PUFFS BY MOUTH EVERY 6 HOURS AS NEEDED FOR WHEEZING OR  SHORTNESS  OF  BREATH 18 g 2   losartan (COZAAR) 50 MG tablet Take 1 tablet (50 mg total) by mouth daily. 90 tablet 3   fluticasone (FLOVENT HFA) 110 MCG/ACT inhaler Inhale 1 puff into the lungs in the morning and at bedtime. Rinse after use. (Patient not taking: Reported on 01/28/2023) 1 each 12   rOPINIRole (REQUIP) 0.25 MG tablet Take 1-2 tablets (0.25-0.5 mg total) by mouth at bedtime. As needed (Patient taking differently: Take 0.25-0.5 mg by mouth at bedtime as needed (Restless legs). As needed)     Current Facility-Administered Medications  Medication Dose Route Frequency Provider Last Rate Last Admin   0.9 %  sodium chloride infusion  500 mL Intravenous Once Meryl Dare, MD        Allergies as of 02/25/2023 - Review Complete 02/25/2023  Allergen Reaction Noted   Fire ant Other (See Comments) 05/04/2018   Gabapentin Other (See Comments) 07/27/2020   Vioxx [rofecoxib] Other (See Comments) 01/25/2016    Family  History  Problem Relation Age of Onset   Arthritis Mother    Heart disease Mother 68   Cancer Mother        colon, breast, ovarian   Allergies Mother    Kidney failure Mother    Kidney disease Mother        died of renal failure   Colon cancer Mother    Arthritis Father    Hypertension Father    Allergies Father    Asthma Father    Parkinsonism Father        possible environmental causes   Allergies Brother    Arthritis Paternal Aunt    Cancer Paternal Aunt    Arthritis Paternal Uncle    Cancer Paternal Uncle    Kidney failure Maternal Grandfather    Prostate cancer Maternal Grandfather    Arthritis Paternal  Grandmother    Arthritis Paternal Grandfather    Cancer Paternal Grandfather    Stroke Paternal Grandfather    Aneurysm Neg Hx    Rectal cancer Neg Hx    Stomach cancer Neg Hx    Esophageal cancer Neg Hx     Social History   Socioeconomic History   Marital status: Married    Spouse name: [name]   Number of children: 1   Years of education: Not on file   Highest education level: Not on file  Occupational History   Occupation: office manager/outside Investment banker, corporate: [employer]   Tobacco Use   Smoking status: Former    Packs/day: 2.00    Years: 25.00    Additional pack years: 0.00    Total pack years: 50.00    Types: Cigarettes    Quit date: 02/10/2005    Years since quitting: 18.0   Smokeless tobacco: Never  Vaping Use   Vaping Use: Never used  Substance and Sexual Activity   Alcohol use: Yes    Comment: 1 drink a day   Drug use: Yes    Types: Marijuana    Comment: from "time to time".02/25/23-none this week   Sexual activity: Yes  Other Topics Concern   Not on file  Social History Narrative   Remarried 68   Son born 2001   [employer]- office supervisor   Social Determinants of Health   Financial Resource Strain: Not on file  Food Insecurity: Not on file  Transportation Needs: Not on file  Physical Activity: Not on file  Stress: Not on file  Social Connections: Not on file  Intimate Partner Violence: Not on file    Review of Systems:  All systems reviewed were negative except where noted in HPI.   Physical Exam:  General:  Alert, well-developed, in NAD Head:  Normocephalic and atraumatic. Eyes:  Sclera clear, no icterus.   Conjunctiva pink. Ears:  Normal auditory acuity. Mouth:  No deformity or lesions.  Neck:  Supple; no masses. Lungs:  Clear throughout to auscultation.   No wheezes, crackles, or rhonchi.  Heart:  Regular rate and rhythm; no murmurs. Abdomen:  Soft, nondistended, nontender. No masses, hepatomegaly. No  palpable masses.  Normal bowel sounds.    Rectal:  Deferred   Msk:  Symmetrical without gross deformities. Extremities:  Without edema. Neurologic:  Alert and  oriented x 4; grossly normal neurologically. Skin:  Intact without significant lesions or rashes. Psych:  Alert and cooperative. Normal mood and affect.   Venita Lick. Russella Dar  02/25/2023, 8:19 AM See Loretha Stapler, [facility], to contact our on  call provider

## 2023-02-26 ENCOUNTER — Telehealth: Payer: Self-pay

## 2023-02-26 NOTE — Telephone Encounter (Signed)
  Follow up Call-     02/25/2023    7:30 AM  Call back number  Post procedure Call Back phone  # [phone]  Permission to leave phone message Yes     Patient questions:  Do you have a fever, pain , or abdominal swelling? No. Pain Score  0 *  Have you tolerated food without any problems? Yes.    Have you been able to return to your normal activities? Yes.    Do you have any questions about your discharge instructions: Diet   No. Medications  No. Follow up visit  No.  Do you have questions or concerns about your Care? No.  Actions: * If pain score is 4 or above: No action needed, pain <4.

## 2023-03-05 ENCOUNTER — Encounter: Payer: Self-pay | Admitting: Gastroenterology

## 2023-03-10 ENCOUNTER — Encounter: Payer: Self-pay | Admitting: Family Medicine

## 2023-03-10 ENCOUNTER — Ambulatory Visit: Payer: BC Managed Care – PPO | Admitting: Family Medicine

## 2023-03-10 ENCOUNTER — Ambulatory Visit (INDEPENDENT_AMBULATORY_CARE_PROVIDER_SITE_OTHER)
Admission: RE | Admit: 2023-03-10 | Discharge: 2023-03-10 | Disposition: A | Discharge status: Home / Self Care | Payer: BC Managed Care – PPO | Source: Ambulatory Visit | Attending: Family Medicine | Admitting: Family Medicine

## 2023-03-10 VITALS — BP 120/72 | HR 69 | Temp 98.7°F | Ht 70.0 in | Wt 265.0 lb

## 2023-03-10 DIAGNOSIS — M545 Low back pain, unspecified: Secondary | ICD-10-CM

## 2023-03-10 DIAGNOSIS — M5136 Other intervertebral disc degeneration, lumbar region: Secondary | ICD-10-CM | POA: Diagnosis not present

## 2023-03-10 DIAGNOSIS — J4530 Mild persistent asthma, uncomplicated: Secondary | ICD-10-CM

## 2023-03-10 DIAGNOSIS — N529 Male erectile dysfunction, unspecified: Secondary | ICD-10-CM

## 2023-03-10 DIAGNOSIS — M47816 Spondylosis without myelopathy or radiculopathy, lumbar region: Secondary | ICD-10-CM | POA: Diagnosis not present

## 2023-03-10 DIAGNOSIS — N5089 Other specified disorders of the male genital organs: Secondary | ICD-10-CM

## 2023-03-10 DIAGNOSIS — G2581 Restless legs syndrome: Secondary | ICD-10-CM | POA: Diagnosis not present

## 2023-03-10 MED ORDER — SILDENAFIL CITRATE 20 MG PO TABS: 20.0000 mg | ORAL_TABLET | Freq: Every day | ORAL | 12 refills | Status: DC | PRN

## 2023-03-10 MED ORDER — LORATADINE 10 MG PO TABS: 10.0000 mg | ORAL_TABLET | Freq: Every day | ORAL | Status: DC

## 2023-03-10 MED ORDER — ROPINIROLE HCL 0.25 MG PO TABS: 0.2500 mg | ORAL_TABLET | Freq: Every evening | ORAL | Status: DC | PRN

## 2023-03-10 MED ORDER — NYSTATIN 100000 UNIT/GM EX POWD: 1.0000 | Freq: Two times a day (BID) | CUTANEOUS | 0 refills | Status: DC

## 2023-03-10 NOTE — Progress Notes (Unsigned)
He has used requip prn with relief.  Not needed every night.  Leg and back sx.  Usually at [facility] he is waking with back pain.  Will get up and move to recliner.  Initially difficult to stand after getting up, due to pain.  Pain with standing after sitting for about 45 minutes.  He has muscle aches in the B hamstrings when trying to get up.  No pain walking.  No claudication.  He rotated his mattress, is shopping for a new mattress.  He got this mattress about 6 months ago.  He had some back pain prior to the new mattress but it seems worse in the meantime.    He has L lateral knee numbness at baseline, that is longstanding, >2 months.    His best friend had cancer and a heart attack recently.  Condolences offered.  ED.  Noted in the last few months.  Scrotal irritation noted.  Not lightheaded.  Tolerating losartan 50mg .  Discussed sleep disruption above.  Unclear how much that contributes to erectile dysfunction.  He noted occ wheeze at night.  D/w pt about using SABA prn- hadn't needed until about 3 weeks ago.  Used twice a week.    Meds, vitals, and allergies reviewed.   ROS: Per HPI unless specifically indicated in ROS section   Nad Ncat Neck supple no LA Rrr Ctab Abdomen soft. Mild R sided scrotal irritation. L lateral knee paresthesia.  SLR neg Back not ttp

## 2023-03-10 NOTE — Patient Instructions (Addendum)
Go to the lab on the way out.   If you have mychart we'll likely use that to update you.    Take care.  Glad to see you. Let me know next week if nystatin and claritin don't help. Keep stretching and check on a new mattress.   Sildenafil as needed.  Update me as needed.

## 2023-03-11 DIAGNOSIS — N529 Male erectile dysfunction, unspecified: Secondary | ICD-10-CM | POA: Insufficient documentation

## 2023-03-11 DIAGNOSIS — N5089 Other specified disorders of the male genital organs: Secondary | ICD-10-CM | POA: Insufficient documentation

## 2023-03-11 NOTE — Assessment & Plan Note (Signed)
Reasonable to add on Claritin for possible seasonal allergy contribution and then update me as needed.

## 2023-03-11 NOTE — Assessment & Plan Note (Signed)
No nitroglycerin use.  Routine medication cautions given to patient for sildenafil.  He can gradually increase from 20 mg up to 100 mg/day if needed.

## 2023-03-11 NOTE — Assessment & Plan Note (Signed)
Could be related to or exacerbated by his mattress.  He is checking getting a new mattress.  See notes on plain films.  Reasonable to keep stretching.

## 2023-03-11 NOTE — Assessment & Plan Note (Signed)
Can try nystatin and update me as needed.  He agrees to plan.

## 2023-03-11 NOTE — Assessment & Plan Note (Signed)
Continue Requip as needed.

## 2023-04-17 ENCOUNTER — Telehealth: Payer: Self-pay | Admitting: Family Medicine

## 2023-04-17 MED ORDER — ASMANEX (60 METERED DOSES) 220 MCG/ACT IN AEPB: 2.0000 | INHALATION_SPRAY | Freq: Every day | RESPIRATORY_TRACT | 3 refills | Status: DC

## 2023-04-17 NOTE — Telephone Encounter (Signed)
Called patient reviewed all information and repeated back to me. Will call if any questions.  ? ?

## 2023-04-17 NOTE — Telephone Encounter (Signed)
Sent.  Let me know if he can't get it filled or if it doesn't help.  Thanks.

## 2023-04-17 NOTE — Telephone Encounter (Signed)
Patient called the office to give Para March an update regarding medication claritin. States he was recently taken off his daily inhaler, and given claritin to help with wheezing in nighttime. Patient says claritin is not helping much with this, he would like to know if Para March could restart an inhaler he took previously called asthmanix (sorry if spelt wrong). Says it really seemed to help, but there was a manufacturer shortage so he discontinued it. Patient can be reached at mobile number if needed.

## 2023-06-01 ENCOUNTER — Other Ambulatory Visit: Payer: Self-pay | Admitting: Family Medicine

## 2023-06-29 ENCOUNTER — Ambulatory Visit: Payer: BC Managed Care – PPO | Admitting: Family Medicine

## 2023-06-29 ENCOUNTER — Encounter: Payer: Self-pay | Admitting: Family Medicine

## 2023-06-29 VITALS — BP 138/74 | HR 71 | Temp 98.1°F | Ht 70.0 in | Wt 272.8 lb

## 2023-06-29 DIAGNOSIS — S61419A Laceration without foreign body of unspecified hand, initial encounter: Secondary | ICD-10-CM | POA: Diagnosis not present

## 2023-06-29 DIAGNOSIS — M549 Dorsalgia, unspecified: Secondary | ICD-10-CM | POA: Diagnosis not present

## 2023-06-29 DIAGNOSIS — R519 Headache, unspecified: Secondary | ICD-10-CM | POA: Diagnosis not present

## 2023-06-29 NOTE — Patient Instructions (Addendum)
Likely head pain was at the occipital ridge- try icing and aleve with food if needed.   Refer to PT.  Take care.  Glad to see you.

## 2023-06-29 NOTE — Progress Notes (Unsigned)
HA.  Had a HA for 15 days then it went away 2 days ago.  Dull HA.  Took some aleve.  Posterior HA, at the occiput.  Then it resolved.  No anterior neck pain.  No FCNAVD.  No ear pain but prev felt like he has fluid in the L ear. Rhinorrhea in the AM, unchanged, not noted later in the day.  No ST.  No trauma.  Has changed pillows mult times.  Not sleeping well in general.  "I've never been a good sleeper."  He has changed mattresses mult times but still waking with back aches frequently.  Pain didn't radiate to the top of the scalp.    His friend died this summer and that has been difficult, condolences offered.  D/w pt.    1cm skin tear R thumb- he was playing with his dog.  Dog is vaccinated.  He cleaned the area.  No fevers.  Normal ROM.  It is clearly better from a few days ago.  I asked him to update me as needed.  Dog is acting normally.    Meds, vitals, and allergies reviewed.   ROS: Per HPI unless specifically indicated in ROS section   Nad Ncat Neck supple, no LA TM w/o erythema B Mmm Neck supple, no LA rrr CN 2-12 wnl B, S/S wnl x4 Right thumb with 1 cm clean appearing skin tear without spreading erythema.

## 2023-07-01 DIAGNOSIS — S61419A Laceration without foreign body of unspecified hand, initial encounter: Secondary | ICD-10-CM | POA: Insufficient documentation

## 2023-07-01 NOTE — Assessment & Plan Note (Signed)
With that the occipital ridge, no symptoms now.  Discussed options.  He could have had muscle pain at the insertion at the occipital ridge. D/w pt about trial of PT for his back and neck.  He can update me as needed.  He agrees with plan.

## 2023-07-01 NOTE — Assessment & Plan Note (Signed)
Appears to be healing normally.  Update me as needed.  He agrees to plan.  He can keep it clean and covered in the meantime.

## 2023-07-13 ENCOUNTER — Ambulatory Visit: Payer: BC Managed Care – PPO | Attending: Family Medicine

## 2023-07-13 ENCOUNTER — Other Ambulatory Visit: Payer: Self-pay

## 2023-07-13 DIAGNOSIS — G44209 Tension-type headache, unspecified, not intractable: Secondary | ICD-10-CM | POA: Diagnosis not present

## 2023-07-13 DIAGNOSIS — G8929 Other chronic pain: Secondary | ICD-10-CM | POA: Diagnosis not present

## 2023-07-13 DIAGNOSIS — M549 Dorsalgia, unspecified: Secondary | ICD-10-CM | POA: Insufficient documentation

## 2023-07-13 DIAGNOSIS — M545 Low back pain, unspecified: Secondary | ICD-10-CM | POA: Diagnosis not present

## 2023-07-13 DIAGNOSIS — R293 Abnormal posture: Secondary | ICD-10-CM | POA: Insufficient documentation

## 2023-07-13 NOTE — Therapy (Signed)
OUTPATIENT PHYSICAL THERAPY THORACOLUMBAR EVALUATION   Patient Name: Alex Oneal MRN: [id number] DOB:03/07/1966, 57 y.o., male Today's Date: 07/13/2023  END OF SESSION:  PT End of Session - 07/13/23 1016     Visit Number 1    Date for PT Re-Evaluation 09/07/23    Progress Note Due on Visit 10    PT Start Time 1015    PT Stop Time 1115    PT Time Calculation (min) 60 min    Activity Tolerance Patient tolerated treatment well    Behavior During Therapy [facility] for tasks assessed/performed             Past Medical History:  Diagnosis Date   Abnormal biliary HIDA scan    Arthritis    Asthma    exercise induced   Former smoker    QUIT 02/2005   Glucose intolerance (impaired glucose tolerance)    Gynecomastia, male    Horseshoe kidney    Hypertension    Obesity    Shortness of breath dyspnea    with activfity   Sleep apnea    Past Surgical History:  Procedure Laterality Date   COLONOSCOPY     COLONOSCOPY W/ POLYPECTOMY     INSERTION OF MESH N/A 10/18/2015   Procedure: INSERTION OF MESH;  Surgeon: Harriette Bouillon, MD;  Location: [facility] OR;  Service: General;  Laterality: N/A;   TONSILLECTOMY     VENTRAL HERNIA REPAIR N/A 10/18/2015   Procedure: HERNIA REPAIR VENTRAL ADULT;  Surgeon: Harriette Bouillon, MD;  Location: [facility] OR;  Service: General;  Laterality: N/A;   Patient Active Problem List   Diagnosis Date Noted   Skin tear of hand without complication 07/01/2023   Erectile dysfunction 03/11/2023   Scrotal irritation 03/11/2023   Sore throat 08/13/2022   Paronychia of left thumb 02/14/2022   Hyperglycemia 02/14/2022   Memory change 02/14/2022   Lip lesion 10/06/2021   Achilles bursitis 08/04/2021   RLS (restless legs syndrome) 07/29/2020   Headache 01/03/2020   Horseshoe kidney with renal calculus    Tubulovillous adenoma 07/15/2019   Internal hemorrhoids 07/07/2019   PND (paroxysmal nocturnal dyspnea) 03/23/2019   OSA (obstructive sleep apnea) 03/23/2019   Left hip  pain 08/16/2018   Skin lesion 05/10/2018   Snoring 05/05/2018   Diverticulosis 01/08/2018   Functional belching disorder 01/08/2018   Osteoarthritis 01/08/2018   Bloating 11/12/2017   Atherosclerosis of aorta (HCC) 06/14/2017   Pulmonary nodule 06/14/2017   FH: prostate cancer 04/22/2017   Shoulder pain 03/12/2017   Knee clicking 03/12/2017   Paresthesia 04/18/2016   Pain of lower extremity 01/25/2016   Routine general medical examination at a health care facility 04/11/2015   Advance care planning 04/11/2015   Back pain 03/02/2015   Abnormal biliary HIDA scan 10/24/2014   GERD (gastroesophageal reflux disease) 03/21/2014   Family history of colon cancer in mother 03/21/2014   Seasonal allergic rhinitis 12/14/2013   Asthma, mild persistent 09/23/2011   Obesity 09/19/2011   Hypertension 09/19/2011    PCP: Crawford Givens, MD  REFERRING PROVIDER: same  REFERRING DIAG: neck and lower back pain  Rationale for Evaluation and Treatment: Rehabilitation  THERAPY DIAG:  Chronic bilateral low back pain without sciatica  Tension-type headache, not intractable, unspecified chronicity pattern  Abnormal posture  ONSET DATE: neck 06/09/23, lower back over 6 months   SUBJECTIVE:  SUBJECTIVE STATEMENT: The reason that I went to the doctor is because my head hurt for 15 days straight.  Then it went away on it's own My mid back hurts consistently every morning, if I had to get out of the house because of a fire I would be really slow.I have been through 4 different mattresses in the last 6 months  PERTINENT HISTORY:  H/o chronic mid/lower back pain, primarily after prolonged immobilization, worse in am, referred to PT by PCP after MD appt due to his headache  PAIN:  Are you having pain? Yes: NPRS scale:  0-5/10 Pain location: central lower back above mid line, also top of head today Pain description: back hurts in am and after prolonged sitting Aggravating factors: immobilization Relieving factors: alleve  PRECAUTIONS: None  RED FLAGS: None   WEIGHT BEARING RESTRICTIONS: No  FALLS:  Has patient fallen in last 6 months? 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  DIAGNOSTIC FINDINGS:  Lumbar x rays with DJD lumbar multi levels  PATIENT SURVEYS:  Modified Oswestry 10%  NDI 6%     COGNITION: Overall cognitive status: Within functional limits for tasks assessed     SENSATION: Chronic loss of light touch L lateral distal thigh x years per pt  MUSCLE LENGTH: Hamstrings: Right wnl deg; Left wnl deg   POSTURE: R iliac crest high, L low Marked kyphotic thoracic spine with protracted B shoulders   PALPATION: Tender, thickened, B suboccipital musculature CERVICAL ROM:FB 80%, L rot 55, R rot 80  THORACIC ROM:L rotation 65, ext 20  LUMBAR ROM:   AROM eval  Flexion Fingertips 5" from floor  Extension wfl  Right lateral flexion wfl  Left lateral flexion wfl  Right rotation   Left rotation    (Blank rows = not tested)  LOWER EXTREMITY ROM:     AAROM in supine  Right eval Left eval  Hip flexion 95 with ant thigh p!   Hip extension    Hip abduction    Hip adduction    Hip internal  2 with ant thigh p!   Hip external rotation wfl   Knee flexion    Knee extension    Ankle dorsiflexion    Ankle plantarflexion    Ankle inversion    Ankle eversion     (Blank rows = wfl)  LOWER EXTREMITY MMT:    MMT  Right eval Left eval  Hip flexion Wfl but P! R knee   Hip extension    Hip abduction    Hip adduction    Hip internal rotation    Hip external rotation    Knee flexion    Knee extension    Ankle dorsiflexion heel walking wfl wfl  Ankle plantarflexion toe walking wfl wfl  Ankle inversion    Ankle eversion     (Blank rows = wfl)  LUMBAR SPECIAL TESTS:  Straight leg raise test: Negative  FUNCTIONAL TESTS:  TBD  GAIT: Distance walked: 39' in clinic, no device, I , rotates L hip externally, R forward.  No other deficits noted TODAY'S TREATMENT:  DATE: 07/13/23:  Supine for contract/relax for L upper cervical rotation mobility Supine for cervical manual traction and resisted upper cervical extension motion Supine for mobilization with movement R hip, lateral distraction of hip jt combined with R hip flexion, 2 set 15 reps  PATIENT EDUCATION:  Education details: POC, goals Person educated: Patient Education method: Programmer, multimedia, Demonstration, Tactile cues, Verbal cues, and Handouts Education comprehension: verbalized understanding, returned demonstration, verbal cues required, tactile cues required, and needs further education  HOME EXERCISE PROGRAM: -Instructed in SNAGS for upper cervical spine L rotation to utilize when stiff or experiencing headache -instructed in lunge with L foot on stool, to open R hip capsule with overpressure PA with R hand on hip  ASSESSMENT:  CLINICAL IMPRESSION: Patient is a 57 y.o. male who was evaluated today by skilled physical therapy due to neck and lower back pain.  The back pain is chronic, affected primarily after periods of prolonged immobility, such as first thing in am and after prolonged sitting. The neck pain is more recent, associated with a 15 day headache that resolved one week ago, most likely musculoskeletal with  associated suboccipital musculature tenderness and L upper cervical rotation restriction.  He also demonstrated decreased R hip ROM for flex, IR and thoracic L rotation, extension.  He has been working out with a trainer 3 days a week for about 2 years, has lost around 80# purposely.  His upper lumbar, lower thoracic pain also is most likely musculoskeletal, with associated thoracic spine stiffness and postural changes as well as R hip stiffness.  He should be able to improve his pain with skilled PT intervention utilizing manual techniques and instruction in appropriate therex that he may perform on his own and also with his trainer. He wishes to attend once a week and for a short duration, primarily for home instruction. OBJECTIVE IMPAIRMENTS: decreased ROM, hypomobility, increased fascial restrictions, postural dysfunction, and pain.   ACTIVITY LIMITATIONS: sleeping, bed mobility, and locomotion level  PARTICIPATION LIMITATIONS:  sleep  PERSONAL FACTORS: Behavior pattern, Past/current experiences, Time since onset of injury/illness/exacerbation, and 1-2 comorbidities: asthma, obesity  are also affecting patient's functional outcome.   REHAB POTENTIAL: Good  CLINICAL DECISION MAKING: Stable/uncomplicated  EVALUATION COMPLEXITY: Moderate   GOALS: Goals reviewed with patient? Yes  SHORT TERM GOALS: Target date: 2 weeks 07/27/23  I HEP Baseline:initiated today Goal status: INITIAL   LONG TERM GOALS: Target date: 09/07/23  Cervical spine ROM L rotation improve to 85% Baseline: 55% Goal status: INITIAL  2.  Modified oswestry score improve from 10% to 2% deficit Baseline:  Goal status: INITIAL  3.  Resolution of headaches Baseline: headache at eval, prior to eval 15 days straight headaches Goal status: INITIAL  4.  Improve thoracic spine extension from 20 to 50% for reduced pain with bed mobility Baseline:  Goal status: INITIAL  PLAN:  PT FREQUENCY: 1x/week  PT DURATION: 8  weeks  PLANNED INTERVENTIONS: 97110-Therapeutic exercises, 97530- Therapeutic activity, 97112- Neuromuscular re-education, 97535- Self Care, 16109- Manual therapy, Patient/Family education, Taping, Dry Needling, and Joint mobilization.  PLAN FOR NEXT SESSION: next session palpate/assess thoracic spine mobility, add specific thoracic spine stretching, reassess upper cervical spine motion, review therex cervical snags and r ant hip jt capsule stretch   Petr Bontempo L Aubriee Szeto, PT 07/13/2023, 12:04 PM

## 2023-07-15 NOTE — Progress Notes (Signed)
Physician Documentation  Your signature is required to indicate approval of the treatment plan as stated above. By signing this report, you are approving the plan of care. Please sign and either send electronically or print and fax the signed copy to the number below. If you approve with modifications, please indicate those in the space provided.__ Physician Signature: [name], MD  Date:_11/06/24 ______ Time:____9:33 AM _

## 2023-09-24 ENCOUNTER — Ambulatory Visit: Payer: Self-pay | Admitting: Family Medicine

## 2023-09-24 NOTE — Telephone Encounter (Signed)
Copied from CRM [id number]. Topic: Clinical - Red Word Triage >> Sep 24, 2023  3:49 PM Gurney Maxin H wrote: [facility] that prompted transfer to Nurse Triage: Patient states his right leg hurts if he sits or walks pain in his thigh, hip, knee and crotch area.  Chief Complaint: right leg pain Symptoms: pain between knee and thigh up into the "crotch" area Frequency: constant Pertinent Negatives: Patient denies injury, fever, calf pain, numbness and weakness Disposition: [] ED /[] Urgent Care (no appt availability in office) / [x] Appointment(In office/virtual)/ []  [facility]/ [] Home Care/ [] Refused Recommended Disposition /[] [facility]/ []  Follow-up with PCP Additional Notes: Patient c/o right leg pain. Denies injury.  Apt made for Monday per patient's request.  Care advice given and instructed to go to ER if becomes worse.  Denies questions.   Reason for Disposition  [1] MODERATE pain (e.g., interferes with normal activities, limping) AND [2] present > 3 days  Answer Assessment - Initial Assessment Questions 1. ONSET: "When did the pain start?"      Right leg pain; started about a week ago 2. LOCATION: "Where is the pain located?"      Between knee and thigh towards "crotch" 3. PAIN: "How bad is the pain?"    (Scale 1-10; or mild, moderate, severe)   -  MILD (1-3): doesn't interfere with normal activities    -  MODERATE (4-7): interferes with normal activities (e.g., work or school) or awakens from sleep, limping    -  SEVERE (8-10): excruciating pain, unable to do any normal activities, unable to walk     5/10 4. WORK OR EXERCISE: "Has there been any recent work or exercise that involved this part of the body?"      denies 5. CAUSE: "What do you think is causing the leg pain?"     unknown 6. OTHER SYMPTOMS: "Do you have any other symptoms?" (e.g., chest pain, back pain, breathing difficulty, swelling, rash, fever, numbness, weakness)     Denies.  Protocols used: Leg  Pain-A-AH

## 2023-09-24 NOTE — Telephone Encounter (Signed)
Noted. Thanks.

## 2023-09-28 ENCOUNTER — Encounter: Payer: Self-pay | Admitting: Family Medicine

## 2023-09-28 ENCOUNTER — Ambulatory Visit: Payer: BC Managed Care – PPO | Admitting: Family Medicine

## 2023-09-28 ENCOUNTER — Ambulatory Visit (INDEPENDENT_AMBULATORY_CARE_PROVIDER_SITE_OTHER)
Admission: RE | Admit: 2023-09-28 | Discharge: 2023-09-28 | Disposition: A | Discharge status: Home / Self Care | Payer: BC Managed Care – PPO | Source: Ambulatory Visit | Attending: Family Medicine | Admitting: Family Medicine

## 2023-09-28 VITALS — BP 140/85 | HR 73 | Temp 97.9°F | Ht 70.0 in | Wt 291.1 lb

## 2023-09-28 DIAGNOSIS — M25561 Pain in right knee: Secondary | ICD-10-CM | POA: Diagnosis not present

## 2023-09-28 DIAGNOSIS — M1611 Unilateral primary osteoarthritis, right hip: Secondary | ICD-10-CM | POA: Diagnosis not present

## 2023-09-28 DIAGNOSIS — R1031 Right lower quadrant pain: Secondary | ICD-10-CM

## 2023-09-28 DIAGNOSIS — M25551 Pain in right hip: Secondary | ICD-10-CM | POA: Diagnosis not present

## 2023-09-28 NOTE — Assessment & Plan Note (Signed)
Reassuring knee exam and xray report  May be referred from hip or back  Reassuring exam-normal rom and no tenderness   Will be referring to ortho for this and hip OA Encouraged use of ice  Can try voltaren gel if he wants to avoid oral nsaids

## 2023-09-28 NOTE — Assessment & Plan Note (Signed)
Noted on xray  Causing right groin pain (see a/p for groin pain)  Ortho referral done

## 2023-09-28 NOTE — Progress Notes (Signed)
Subjective:    Patient ID: Alex Oneal, male    DOB: 03-13-1966, 58 y.o.   MRN: [id number]  HPI  Wt Readings from Last 3 Encounters:  09/28/23 291 lb 2 oz (132.1 kg)  06/29/23 272 lb 12.8 oz (123.7 kg)  03/10/23 265 lb (120.2 kg)   41.77 kg/m  Vitals:   09/28/23 0800 09/28/23 0821  BP: (!) 144/86 (!) 140/85  Pulse: 73   Temp: 97.9 F (36.6 C)   SpO2: 98%     58 yo pt presents with right leg pain    Has back issues from old injury  Has chronic tingling left upper thigh   Now back of his right leg hurts when he sits  Started as a little pain in right knee (? From mid step)   Right thigh and groin  Dull pain  Occational takes aleve   Hurts to sit for over 10-15 min  Sore to walk until he gets going then it warms up   He walks 1-3 miles per week    Bad summer-stress/gained weight / drank too much-this is better now  No swelling  No redness Some varicose veins   Last LS film was 03/2023  Associated Dx: Low back pain, unspecified back pain laterality, unspecified chronicity, unspecified whether sciatica present    Study Result CLINICAL DATA: Low back pain  EXAM: LUMBAR SPINE - COMPLETE 4+ VIEW  COMPARISON: Lumbar spine x-ray 10/03/2021  FINDINGS: There is no evidence of lumbar spine fracture. Alignment is normal. There is mild disc space narrowing at L1-L2, L3-L4, L4-L5 and L5-S1 compatible with degenerative change.  IMPRESSION: Mild multilevel degenerative change. No acute fracture or malalignment.     Imaging today  DG Hip Unilat W OR W/O Pelvis 2-3 Views Right Result Date: 09/28/2023 CLINICAL DATA:  Worsening right hip pain EXAM: DG HIP (WITH OR WITHOUT PELVIS) 2-3V RIGHT COMPARISON:  03/10/2023 04/23/2022 FINDINGS: Moderately severe right hip degenerative arthropathy with joint space loss, sclerosis and bony spurring. No acute osseous finding or fracture. Normal alignment. Bony pelvis intact. Left hip unremarkable. SI joints are maintained. No  diastasis Lower abdominal subcentimeter calcifications bilaterally compatible with known intrarenal nephrolithiasis within the horseshoe kidney by comparison CT from 04/23/2022. IMPRESSION: 1. Moderately severe right hip degenerative arthropathy. 2. No acute finding by plain radiography. Electronically Signed   By: Judie Petit.  Shick M.D.   On: 09/28/2023 08:48   DG Knee 4 Views W/Patella Right Result Date: 09/28/2023 CLINICAL DATA:  Posterior right knee pain EXAM: RIGHT KNEE - COMPLETE 4+ VIEW COMPARISON:  None Available. FINDINGS: No evidence of fracture, dislocation, or joint effusion. No evidence of arthropathy or other focal bone abnormality. Soft tissues are unremarkable. IMPRESSION: No acute or significant finding by plain radiography Electronically Signed   By: Judie Petit.  Shick M.D.   On: 09/28/2023 08:45     Patient Active Problem List   Diagnosis Date Noted   Right knee pain 09/28/2023   Right groin pain 09/28/2023   Osteoarthritis of right hip 09/28/2023   Skin tear of hand without complication 07/01/2023   Erectile dysfunction 03/11/2023   Scrotal irritation 03/11/2023   Sore throat 08/13/2022   Paronychia of left thumb 02/14/2022   Hyperglycemia 02/14/2022   Memory change 02/14/2022   Lip lesion 10/06/2021   Achilles bursitis 08/04/2021   RLS (restless legs syndrome) 07/29/2020   Headache 01/03/2020   Horseshoe kidney with renal calculus    Tubulovillous adenoma 07/15/2019   Internal hemorrhoids 07/07/2019  PND (paroxysmal nocturnal dyspnea) 03/23/2019   OSA (obstructive sleep apnea) 03/23/2019   Left hip pain 08/16/2018   Skin lesion 05/10/2018   Snoring 05/05/2018   Diverticulosis 01/08/2018   Functional belching disorder 01/08/2018   Osteoarthritis 01/08/2018   Bloating 11/12/2017   Atherosclerosis of aorta (HCC) 06/14/2017   Pulmonary nodule 06/14/2017   FH: prostate cancer 04/22/2017   Shoulder pain 03/12/2017   Knee clicking 03/12/2017   Paresthesia 04/18/2016   Pain of  lower extremity 01/25/2016   Routine general medical examination at a health care facility 04/11/2015   Advance care planning 04/11/2015   Back pain 03/02/2015   Abnormal biliary HIDA scan 10/24/2014   GERD (gastroesophageal reflux disease) 03/21/2014   Family history of colon cancer in mother 03/21/2014   Seasonal allergic rhinitis 12/14/2013   Asthma, mild persistent 09/23/2011   Obesity 09/19/2011   Hypertension 09/19/2011   Past Medical History:  Diagnosis Date   Abnormal biliary HIDA scan    Arthritis    Asthma    exercise induced   Former smoker    QUIT 02/2005   Glucose intolerance (impaired glucose tolerance)    Gynecomastia, male    Horseshoe kidney    Hypertension    Obesity    Shortness of breath dyspnea    with activfity   Sleep apnea    Past Surgical History:  Procedure Laterality Date   COLONOSCOPY     COLONOSCOPY W/ POLYPECTOMY     INSERTION OF MESH N/A 10/18/2015   Procedure: INSERTION OF MESH;  Surgeon: Harriette Bouillon, MD;  Location: [facility] OR;  Service: General;  Laterality: N/A;   TONSILLECTOMY     VENTRAL HERNIA REPAIR N/A 10/18/2015   Procedure: HERNIA REPAIR VENTRAL ADULT;  Surgeon: Harriette Bouillon, MD;  Location: [facility] OR;  Service: General;  Laterality: N/A;   Social History   Tobacco Use   Smoking status: Former    Current packs/day: 0.00    Average packs/day: 2.0 packs/day for 25.0 years (50.0 ttl pk-yrs)    Types: Cigarettes    Start date: 02/11/1980    Quit date: 02/10/2005    Years since quitting: 18.6   Smokeless tobacco: Never  Vaping Use   Vaping status: Never Used  Substance Use Topics   Alcohol use: Yes    Comment: 1 drink a day   Drug use: Yes    Types: Marijuana    Comment: from "time to time".02/25/23-none this week   Family History  Problem Relation Age of Onset   Arthritis Mother    Heart disease Mother 61   Cancer Mother        colon, breast, ovarian   Allergies Mother    Kidney failure Mother    Kidney disease Mother         died of renal failure   Colon cancer Mother    Arthritis Father    Hypertension Father    Allergies Father    Asthma Father    Parkinsonism Father        possible environmental causes   Allergies Brother    Arthritis Paternal Aunt    Cancer Paternal Aunt    Arthritis Paternal Uncle    Cancer Paternal Uncle    Kidney failure Maternal Grandfather    Prostate cancer Maternal Grandfather    Arthritis Paternal Grandmother    Arthritis Paternal Grandfather    Cancer Paternal Grandfather    Stroke Paternal Grandfather    Aneurysm Neg Hx    Rectal cancer  Neg Hx    Stomach cancer Neg Hx    Esophageal cancer Neg Hx    Allergies  Allergen Reactions   Fire Ant Other (See Comments)    Significant local reaction but no lip swelling.     Gabapentin Other (See Comments)    Pt states that he gets dizzy when he gets out of bed while on this   Vioxx [Rofecoxib] Other (See Comments)    GI upset but not a true allergy   Current Outpatient Medications on File Prior to Visit  Medication Sig Dispense Refill   albuterol (VENTOLIN HFA) 108 (90 Base) MCG/ACT inhaler INHALE 2 PUFFS BY MOUTH EVERY 6 HOURS AS NEEDED FOR WHEEZING OR  SHORTNESS  OF  BREATH 18 g 2   losartan (COZAAR) 50 MG tablet Take 1 tablet (50 mg total) by mouth daily. 90 tablet 3   mometasone (ASMANEX, 60 METERED DOSES,) 220 MCG/ACT inhaler Inhale 2 puffs into the lungs daily. Rinse after use 3 each 3   rOPINIRole (REQUIP) 0.25 MG tablet TAKE 1 TO 2 TABLETS BY MOUTH AT BEDTIME 60 tablet 1   sildenafil (REVATIO) 20 MG tablet Take 1-5 tablets (20-100 mg total) by mouth daily as needed. 50 tablet 12   No current facility-administered medications on file prior to visit.    Review of Systems  Constitutional:  Positive for fatigue. Negative for activity change, appetite change, fever and unexpected weight change.  HENT:  Negative for congestion, rhinorrhea, sore throat and trouble swallowing.   Eyes:  Negative for pain, redness,  itching and visual disturbance.  Respiratory:  Negative for cough, chest tightness, shortness of breath and wheezing.   Cardiovascular:  Negative for chest pain and palpitations.  Gastrointestinal:  Negative for abdominal pain, blood in stool, constipation, diarrhea and nausea.  Endocrine: Negative for cold intolerance, heat intolerance, polydipsia and polyuria.  Genitourinary:  Negative for difficulty urinating, dysuria, frequency and urgency.  Musculoskeletal:  Positive for arthralgias. Negative for joint swelling and myalgias.  Skin:  Negative for pallor and rash.  Neurological:  Negative for dizziness, tremors, weakness, numbness and headaches.  Hematological:  Negative for adenopathy. Does not bruise/bleed easily.  Psychiatric/Behavioral:  Negative for decreased concentration and dysphoric mood. The patient is not nervous/anxious.        Objective:   Physical Exam Constitutional:      General: He is not in acute distress.    Appearance: Normal appearance. He is well-developed. He is obese. He is not ill-appearing.  HENT:     Head: Normocephalic and atraumatic.  Eyes:     Conjunctiva/sclera: Conjunctivae normal.     Pupils: Pupils are equal, round, and reactive to light.  Neck:     Thyroid: No thyromegaly.     Vascular: No carotid bruit or JVD.  Cardiovascular:     Rate and Rhythm: Normal rate and regular rhythm.     Heart sounds: Normal heart sounds.     No gallop.  Pulmonary:     Effort: Pulmonary effort is normal. No respiratory distress.  Abdominal:     General: There is no distension or abdominal bruit.     Palpations: Abdomen is soft.     Tenderness: There is no abdominal tenderness.  Musculoskeletal:     Cervical back: Normal range of motion and neck supple.     Right hip: No deformity, tenderness or crepitus. Decreased range of motion. Normal strength.     Right knee: No swelling or deformity.     Right  lower leg: No edema.     Left lower leg: No edema.      Comments: No tenderness in groin  Slightly tender over right greater trochanter Int/ext rotation cause hip pain and rom is limited   Knee right  No swelling or effusion  No warmth to the touch  No crepitus  ROM:  Flex nl Ext nl Mcmurray normal  Bounce test nl  Stability: Anterior drawer nl Lachman exam nl  Tenderness none  Gait favors LLE      Lymphadenopathy:     Cervical: No cervical adenopathy.  Skin:    General: Skin is warm and dry.     Coloration: Skin is not jaundiced or pale.     Findings: No bruising or rash.  Neurological:     Mental Status: He is alert.     Coordination: Coordination normal.     Deep Tendon Reflexes: Reflexes are normal and symmetric. Reflexes normal.  Psychiatric:        Mood and Affect: Mood normal.           Assessment & Plan:   Problem List Items Addressed This Visit       Musculoskeletal and Integument   Osteoarthritis of right hip   Noted on xray  Causing right groin pain (see a/p for groin pain)  Ortho referral done        Other   Right knee pain   Reassuring knee exam and xray report  May be referred from hip or back  Reassuring exam-normal rom and no tenderness   Will be referring to ortho for this and hip OA Encouraged use of ice  Can try voltaren gel if he wants to avoid oral nsaids       Relevant Orders   DG Knee 4 Views W/Patella Right (Completed)   Right groin pain - Primary   Suspect this is due to right hip OA, per xray moderately severe  Discussed how this causes pain in groin and limited rom (his rotation was limited today) Aware that nsaids can increase blood pressure  Discussed use of ice/heat  Referral made to orthopedics  Encouraged to avoid activities that cause worse pain       Relevant Orders   DG Hip Unilat W OR W/O Pelvis 2-3 Views Right (Completed)

## 2023-09-28 NOTE — Assessment & Plan Note (Signed)
Suspect this is due to right hip OA, per xray moderately severe  Discussed how this causes pain in groin and limited rom (his rotation was limited today) Aware that nsaids can increase blood pressure  Discussed use of ice/heat  Referral made to orthopedics  Encouraged to avoid activities that cause worse pain

## 2023-09-28 NOTE — Patient Instructions (Signed)
Xray of hip and knee today   Try ice on areas that hurt (muscle is good also  Careful with aleve - it can raise blood pressure  Volaren gel is a good alternative over the counter    We will make  plan when we get results    Keep working on fitness and weight loss  Schedule your annual exam on the way out

## 2023-10-06 ENCOUNTER — Encounter: Payer: Self-pay | Admitting: Physician Assistant

## 2023-10-06 ENCOUNTER — Ambulatory Visit: Payer: BC Managed Care – PPO | Admitting: Physician Assistant

## 2023-10-06 DIAGNOSIS — M1611 Unilateral primary osteoarthritis, right hip: Secondary | ICD-10-CM | POA: Diagnosis not present

## 2023-10-06 NOTE — Progress Notes (Signed)
Office Visit Note   Patient: Alex Oneal           Date of Birth: [birth year]           MRN: [id number] Visit Date: 10/06/2023              Requested by: Tower, Audrie Gallus, MD [address] [place],  [place] [address] PCP: [name], MD   Assessment & Plan: Visit Diagnoses: Osteoarthritis right hip  Plan: Patient is a pleasant 58 year old gentleman who has had 5 to 6-week history of right groin pain.  Denies any injury.  He was seen and evaluated by primary care x-rays demonstrated mild to moderate degenerative changes.  Certainly his pain is most consistent with arthritis in his right hip he does have pain with logrolling.  Does radiate down to the knee with internal/external rotation of the hip and he has some stiffness especially with external rotation.  He was taking a supplement up until about 6 weeks ago and then he stopped.  That is when his pain began.  Supplement is composed with some spices as well as turmeric.  I have encouraged that if his helped him to go back to continue using this.  He Alex also works out with a trainer 3 days a week emphasized the importance of keeping his hip stretched out.  He also is working on weight loss.  If he had recurrent symptoms he Alex contact me and I Alex refer him to Dr. Shon Baton for an intra-articular ultrasound-guided injection  Follow-Up Instructions: Return if symptoms worsen or fail to improve.   Orders:  No orders of the defined types were placed in this encounter.  No orders of the defined types were placed in this encounter.     Procedures: No procedures performed   Clinical Data: No additional findings.   Subjective: Chief Complaint  Patient presents with   Right Hip - Pain   Right Knee - Pain    HPI pleasant 58 year old gentleman with 5 to 6-week history of right groin pain radiating down to his knee.  X-rays demonstrate moderate degenerative changes with osteophyte formation in his right hip.  He was taking a  anti-inflammatory natural supplement which was made up of various herbs including turmeric.  He said since he stopped taking that his pain began.  He also is working with a Psychologist, educational 3 days a week.  He has lost some weight but admitted he put some back on because he had some stressful situations going on last summer working diligently on weight loss now  Review of Systems  All other systems reviewed and are negative.    Objective: Vital Signs: There were no vitals taken for this visit.  Physical Exam Constitutional:      Appearance: Normal appearance.  Pulmonary:     Effort: Pulmonary effort is normal.  Skin:    General: Skin is warm and dry.  Neurological:     General: No focal deficit present.     Mental Status: He is alert and oriented to person, place, and time.  Psychiatric:        Mood and Affect: Mood normal.        Behavior: Behavior normal.     Ortho Exam Examination of his right hip he is neurovascularly intact compartments are soft and nontender has good dorsiflexion plantarflexion extension flexion of his legs.  No radicular findings he is tender in his right groin especially with external and internal rotation  also has some stiffness with external rotation.  Compartments are soft and nontender no signs of infection Specialty Comments:  No specialty comments available.  Imaging: No results found.   PMFS History: Patient Active Problem List   Diagnosis Date Noted   Right knee pain 09/28/2023   Right groin pain 09/28/2023   Unilateral primary osteoarthritis, right hip 09/28/2023   Skin tear of hand without complication 07/01/2023   Erectile dysfunction 03/11/2023   Scrotal irritation 03/11/2023   Sore throat 08/13/2022   Paronychia of left thumb 02/14/2022   Hyperglycemia 02/14/2022   Memory change 02/14/2022   Lip lesion 10/06/2021   Achilles bursitis 08/04/2021   RLS (restless legs syndrome) 07/29/2020   Headache 01/03/2020   Horseshoe kidney with renal  calculus    Tubulovillous adenoma 07/15/2019   Internal hemorrhoids 07/07/2019   PND (paroxysmal nocturnal dyspnea) 03/23/2019   OSA (obstructive sleep apnea) 03/23/2019   Pain in right hip 08/16/2018   Skin lesion 05/10/2018   Snoring 05/05/2018   Diverticulosis 01/08/2018   Functional belching disorder 01/08/2018   Osteoarthritis 01/08/2018   Bloating 11/12/2017   Atherosclerosis of aorta (HCC) 06/14/2017   Pulmonary nodule 06/14/2017   FH: prostate cancer 04/22/2017   Shoulder pain 03/12/2017   Knee clicking 03/12/2017   Paresthesia 04/18/2016   Pain of lower extremity 01/25/2016   Routine general medical examination at a health care facility 04/11/2015   Advance care planning 04/11/2015   Back pain 03/02/2015   Abnormal biliary HIDA scan 10/24/2014   GERD (gastroesophageal reflux disease) 03/21/2014   Family history of colon cancer in mother 03/21/2014   Seasonal allergic rhinitis 12/14/2013   Asthma, mild persistent 09/23/2011   Obesity 09/19/2011   Hypertension 09/19/2011   Past Medical History:  Diagnosis Date   Abnormal biliary HIDA scan    Arthritis    Asthma    exercise induced   Former smoker    QUIT 02/2005   Glucose intolerance (impaired glucose tolerance)    Gynecomastia, male    Horseshoe kidney    Hypertension    Obesity    Shortness of breath dyspnea    with activfity   Sleep apnea     Family History  Problem Relation Age of Onset   Arthritis Mother    Heart disease Mother 28   Cancer Mother        colon, breast, ovarian   Allergies Mother    Kidney failure Mother    Kidney disease Mother        died of renal failure   Colon cancer Mother    Arthritis Father    Hypertension Father    Allergies Father    Asthma Father    Parkinsonism Father        possible environmental causes   Allergies Brother    Arthritis Paternal Aunt    Cancer Paternal Aunt    Arthritis Paternal Uncle    Cancer Paternal Uncle    Kidney failure Maternal  Grandfather    Prostate cancer Maternal Grandfather    Arthritis Paternal Grandmother    Arthritis Paternal Grandfather    Cancer Paternal Grandfather    Stroke Paternal Grandfather    Aneurysm Neg Hx    Rectal cancer Neg Hx    Stomach cancer Neg Hx    Esophageal cancer Neg Hx     Past Surgical History:  Procedure Laterality Date   COLONOSCOPY     COLONOSCOPY W/ POLYPECTOMY     INSERTION OF MESH N/A  10/18/2015   Procedure: INSERTION OF MESH;  Surgeon: Harriette Bouillon, MD;  [facility];  Service: General;  Laterality: N/A;   TONSILLECTOMY     VENTRAL HERNIA REPAIR N/A 10/18/2015   Procedure: HERNIA REPAIR VENTRAL ADULT;  Surgeon: Harriette Bouillon, MD;  Location: [facility] OR;  Service: General;  Laterality: N/A;   Social History   Occupational History   Occupation: office manager/outside Investment banker, corporate: [employer]   Tobacco Use   Smoking status: Former    Current packs/day: 0.00    Average packs/day: 2.0 packs/day for 25.0 years (50.0 ttl pk-yrs)    Types: Cigarettes    Start date: 02/11/1980    Quit date: 02/10/2005    Years since quitting: 18.6   Smokeless tobacco: Never  Vaping Use   Vaping status: Never Used  Substance and Sexual Activity   Alcohol use: Yes    Comment: 1 drink a day   Drug use: Yes    Types: Marijuana    Comment: from "time to time".02/25/23-none this week   Sexual activity: Yes

## 2023-10-07 ENCOUNTER — Other Ambulatory Visit: Payer: Self-pay | Admitting: Family Medicine

## 2023-10-11 ENCOUNTER — Other Ambulatory Visit: Payer: Self-pay | Admitting: Family Medicine

## 2023-10-11 DIAGNOSIS — G2581 Restless legs syndrome: Secondary | ICD-10-CM

## 2023-10-11 DIAGNOSIS — R739 Hyperglycemia, unspecified: Secondary | ICD-10-CM

## 2023-10-11 DIAGNOSIS — Z125 Encounter for screening for malignant neoplasm of prostate: Secondary | ICD-10-CM

## 2023-10-11 DIAGNOSIS — I10 Essential (primary) hypertension: Secondary | ICD-10-CM

## 2023-10-13 ENCOUNTER — Other Ambulatory Visit (INDEPENDENT_AMBULATORY_CARE_PROVIDER_SITE_OTHER): Payer: BC Managed Care – PPO

## 2023-10-13 DIAGNOSIS — G2581 Restless legs syndrome: Secondary | ICD-10-CM | POA: Diagnosis not present

## 2023-10-13 DIAGNOSIS — Z125 Encounter for screening for malignant neoplasm of prostate: Secondary | ICD-10-CM

## 2023-10-13 DIAGNOSIS — I10 Essential (primary) hypertension: Secondary | ICD-10-CM | POA: Diagnosis not present

## 2023-10-13 DIAGNOSIS — R739 Hyperglycemia, unspecified: Secondary | ICD-10-CM

## 2023-10-13 LAB — COMPREHENSIVE METABOLIC PANEL
ALT: 27 U/L (ref 0–53)
AST: 22 U/L (ref 0–37)
Albumin: 4 g/dL (ref 3.5–5.2)
Alkaline Phosphatase: 62 U/L (ref 39–117)
BUN: 15 mg/dL (ref 6–23)
CO2: 28 meq/L (ref 19–32)
Calcium: 8.8 mg/dL (ref 8.4–10.5)
Chloride: 103 meq/L (ref 96–112)
Creatinine, Ser: 0.83 mg/dL (ref 0.40–1.50)
GFR: 95.69 mL/min (ref >60.00)
Glucose, Bld: 103 mg/dL — ABNORMAL HIGH (ref 70–99)
Potassium: 4.3 meq/L (ref 3.5–5.1)
Sodium: 139 meq/L (ref 135–145)
Total Bilirubin: 0.7 mg/dL (ref 0.2–1.2)
Total Protein: 6.4 g/dL (ref 6.0–8.3)

## 2023-10-13 LAB — CBC WITH DIFFERENTIAL/PLATELET
Basophils Absolute: 0.1 10*3/uL (ref 0.0–0.1)
Basophils Relative: 0.8 % (ref 0.0–3.0)
Eosinophils Absolute: 0.1 10*3/uL (ref 0.0–0.7)
Eosinophils Relative: 1.7 % (ref 0.0–5.0)
HCT: 48.6 % (ref 39.0–52.0)
Hemoglobin: 16.4 g/dL (ref 13.0–17.0)
Lymphocytes Relative: 31.7 % (ref 12.0–46.0)
Lymphs Abs: 2.2 10*3/uL (ref 0.7–4.0)
MCHC: 33.7 g/dL (ref 30.0–36.0)
MCV: 93.6 fL (ref 78.0–100.0)
Monocytes Absolute: 1 10*3/uL (ref 0.1–1.0)
Monocytes Relative: 14.5 % — ABNORMAL HIGH (ref 3.0–12.0)
Neutro Abs: 3.5 10*3/uL (ref 1.4–7.7)
Neutrophils Relative %: 51.3 % (ref 43.0–77.0)
Platelets: 246 10*3/uL (ref 150.0–400.0)
RBC: 5.19 Mil/uL (ref 4.22–5.81)
RDW: 12.9 % (ref 11.5–15.5)
WBC: 6.9 10*3/uL (ref 4.0–10.5)

## 2023-10-13 LAB — LIPID PANEL
Cholesterol: 108 mg/dL (ref 0–200)
HDL: 39.3 mg/dL (ref >39.00)
LDL Cholesterol: 58 mg/dL (ref 0–99)
NonHDL: 69.16
Total CHOL/HDL Ratio: 3
Triglycerides: 55 mg/dL (ref 0.0–149.0)
VLDL: 11 mg/dL (ref 0.0–40.0)

## 2023-10-13 LAB — HEMOGLOBIN A1C: Hgb A1c MFr Bld: 6.2 % (ref 4.6–6.5)

## 2023-10-13 LAB — PSA: PSA: 4.32 ng/mL — ABNORMAL HIGH (ref 0.10–4.00)

## 2023-10-20 ENCOUNTER — Encounter: Payer: Self-pay | Admitting: Family Medicine

## 2023-10-20 ENCOUNTER — Ambulatory Visit: Payer: BC Managed Care – PPO | Admitting: Family Medicine

## 2023-10-20 VITALS — BP 132/84 | HR 73 | Temp 98.3°F | Ht 70.0 in | Wt 284.8 lb

## 2023-10-20 DIAGNOSIS — Z0000 Encounter for general adult medical examination without abnormal findings: Secondary | ICD-10-CM | POA: Diagnosis not present

## 2023-10-20 DIAGNOSIS — Z125 Encounter for screening for malignant neoplasm of prostate: Secondary | ICD-10-CM

## 2023-10-20 DIAGNOSIS — J4530 Mild persistent asthma, uncomplicated: Secondary | ICD-10-CM

## 2023-10-20 DIAGNOSIS — G2581 Restless legs syndrome: Secondary | ICD-10-CM

## 2023-10-20 DIAGNOSIS — I10 Essential (primary) hypertension: Secondary | ICD-10-CM

## 2023-10-20 DIAGNOSIS — R972 Elevated prostate specific antigen [PSA]: Secondary | ICD-10-CM

## 2023-10-20 DIAGNOSIS — Z7189 Other specified counseling: Secondary | ICD-10-CM

## 2023-10-20 MED ORDER — LOSARTAN POTASSIUM 50 MG PO TABS: 50.0000 mg | ORAL_TABLET | Freq: Every day | ORAL | 3 refills | Status: AC

## 2023-10-20 MED ORDER — ASMANEX (60 METERED DOSES) 220 MCG/ACT IN AEPB: 2.0000 | INHALATION_SPRAY | Freq: Every day | RESPIRATORY_TRACT | 3 refills | Status: DC

## 2023-10-20 MED ORDER — SILDENAFIL CITRATE 20 MG PO TABS: 20.0000 mg | ORAL_TABLET | Freq: Every day | ORAL | 12 refills | Status: AC | PRN

## 2023-10-20 MED ORDER — ALBUTEROL SULFATE HFA 108 (90 BASE) MCG/ACT IN AERS
1.0000 | INHALATION_SPRAY | Freq: Four times a day (QID) | RESPIRATORY_TRACT | 2 refills | Status: AC | PRN
Start: 2023-10-20 — End: ?

## 2023-10-20 MED ORDER — ROPINIROLE HCL 0.25 MG PO TABS
0.2500 mg | ORAL_TABLET | Freq: Every day | ORAL | 1 refills | Status: DC
Start: 2023-10-20 — End: 2024-04-26

## 2023-10-20 NOTE — Patient Instructions (Signed)
Recheck PSA in about 3 months.  Nonfasting lab visit.  Take care.  Glad to see you.

## 2023-10-20 NOTE — Progress Notes (Unsigned)
CPE- See plan.  Routine anticipatory guidance given to patient.  See health maintenance.  The possibility exists that previously documented standard health maintenance information may have been brought forward from a previous encounter into this note.  If needed, that same information has been updated to reflect the current situation based on today's encounter.    Tetanus 2022 PNA d/w pt.  Shingles d/w pt.   Flu shot 2024- d/w pt about dose reportedly done 06/29/23. covid vaccine 2021, J&J booster done 07/12/20.  he had unrecalled booster, d/w pt.   Colonoscopy 2024 PSA screening d/w pt.  PSA elevated, variable results prev. No LUTS   Living will d/w pt.  Wife designated if patient were incapacitated.   HIV and HCV screening prev done.  He tried a new pillow and his HA improved/sleep improved.    Hypertension:               Using medication without problems or lightheadedness: intentional weight loss noted and occ lightheaded.  Chest pain with exertion:yes Edema:no Short of breath: no   Requip used PRN, rarely needed but with relief.  Helps with leg movement at night.     Still on asmanex daily with rare albuterol use.  Rare wheeze.  Recent URI illness resolved.  He has some wheeze with that.     He had seen ortho in the meantime.  Restarted supplement with turmeric and improved.    He is going to have dental work done in the near future.     Some R back/flank pain, d/w pt about prev known renal stones.    PMH and SH reviewed  Meds, vitals, and allergies reviewed.   ROS: Per HPI.  Unless specifically indicated otherwise in HPI, the patient denies:  General: fever. Eyes: acute vision changes ENT: sore throat Cardiovascular: chest pain Respiratory: SOB GI: vomiting GU: dysuria Musculoskeletal: acute back pain Derm: acute rash Neuro: acute motor dysfunction Psych: worsening mood Endocrine: polydipsia Heme: bleeding Allergy: hayfever  GEN: nad, alert and oriented HEENT:  ncat NECK: supple w/o LA CV: rrr. PULM: ctab, no inc wob ABD: soft, +bs EXT: no edema SKIN: no acute rash

## 2023-10-21 DIAGNOSIS — R972 Elevated prostate specific antigen [PSA]: Secondary | ICD-10-CM | POA: Insufficient documentation

## 2023-10-21 NOTE — Assessment & Plan Note (Signed)
PSA screening d/w pt.  PSA elevated, variable results prev. No LUTS.  Defer DRE today as it wouldn't change plan. Recheck PSA in about 3 months.  Nonfasting lab visit.  He agrees with plan.

## 2023-10-21 NOTE — Assessment & Plan Note (Signed)
Continue losartan.

## 2023-10-21 NOTE — Assessment & Plan Note (Signed)
Tetanus 2022 PNA d/w pt.  Shingles d/w pt.   Flu shot 2024- d/w pt about dose reportedly done 06/29/23. covid vaccine 2021, J&J booster done 07/12/20.  he had unrecalled booster, d/w pt.   Colonoscopy 2024 PSA screening d/w pt.  PSA elevated, variable results prev. No LUTS   Living will d/w pt.  Wife designated if patient were incapacitated.   HIV and HCV screening prev done.

## 2023-10-21 NOTE — Assessment & Plan Note (Signed)
Requip used PRN, rarely needed but with relief.  Helps with leg movement at night.  Continue prn use.

## 2023-10-21 NOTE — Assessment & Plan Note (Signed)
Living will d/w pt.  Wife designated if patient were incapacitated.   ?

## 2023-10-21 NOTE — Assessment & Plan Note (Signed)
Continue on asmanex daily with rare albuterol use.  Update me as needed.

## 2023-10-23 ENCOUNTER — Other Ambulatory Visit (HOSPITAL_COMMUNITY): Payer: Self-pay

## 2023-11-23 ENCOUNTER — Ambulatory Visit: Payer: Self-pay | Admitting: Family Medicine

## 2023-11-23 NOTE — Telephone Encounter (Signed)
 Right knee pain x3 months Symptoms: Sharp, dull, aching pain Frequency: Intermittent (stops at rest) Pertinent Negatives: Patient denies burning pain, swelling, redness, difficulty breathing, calf pain, chest pain  Disposition:  [x] Appointment(In office)  Additional Notes: Pt states he had an office visit in Jan for hip and right knee pain. Pt states he was diagnosed with arthritis in his hip causing the pain. Pt states his knee is causing limping and affecting his sleep. Pt scheduled for first available appt with PCP on 3/24. This RN educated pt on home care, new-worsening symptoms, when to call back/seek emergent care. Pt verbalized understanding and agrees to plan.     Copied from CRM [id number]. Topic: Clinical - Red Word Triage >> Nov 23, 2023 10:39 AM Denese Killings wrote: Red Word that prompted transfer to Nurse Triage: Patient states that his knee is very painful. Symptoms - affecting walking, sleeping, and getting up and down Reason for Disposition  Knee pain is a chronic symptom (recurrent or ongoing AND present > 4 weeks)  Answer Assessment - Initial Assessment Questions Right knee pain since January 2025 Sharp, dull, aching pain 5/10, intermittent, pain when getting up or sitting down Denies burning pain, swelling, redness, difficulty breathing, calf pain, chest pain  Protocols used: Knee Pain-A-AH

## 2023-11-23 NOTE — Telephone Encounter (Signed)
 Noted. Thanks.

## 2023-11-30 ENCOUNTER — Encounter: Payer: Self-pay | Admitting: Family Medicine

## 2023-11-30 ENCOUNTER — Ambulatory Visit (INDEPENDENT_AMBULATORY_CARE_PROVIDER_SITE_OTHER): Admitting: Family Medicine

## 2023-11-30 VITALS — BP 136/82 | HR 71 | Temp 98.5°F | Ht 70.0 in | Wt 288.6 lb

## 2023-11-30 DIAGNOSIS — M25569 Pain in unspecified knee: Secondary | ICD-10-CM | POA: Insufficient documentation

## 2023-11-30 MED ORDER — DICLOFENAC SODIUM 1 % EX GEL: 2.0000 g | Freq: Four times a day (QID) | CUTANEOUS | Status: AC | PRN

## 2023-11-30 NOTE — Assessment & Plan Note (Signed)
 Likely patellar tendon and ligament irritation. D/w pt.   Try icing 5 minutes on/off, voltaren gel prn and a jumper's knee strap.  Gently stretch quad prior to getting up in the mornings.  Update me as needed.

## 2023-11-30 NOTE — Patient Instructions (Signed)
 Likely patellar tendon and ligament irritation.  Try icing, voltaren gel and a jumper's knee strap.  Gently stretch you quad prior to getting up in the mornings.  Take care.  Glad to see you.

## 2023-11-30 NOTE — Progress Notes (Signed)
 R knee pain.  Today is a good day but that is atypical.  Recently walking with a cane on bad days.  Having some days with sig pain, limits walking.  Gaining weight with limitations on walking.   R knee doesn't get red puffy or bruised.  Pain with walking.  Pain at the quad ligament and quad tendon.  Not medial and lateral pain.    Meds, vitals, and allergies reviewed.   ROS: Per HPI unless specifically indicated in ROS section   Nad Ncat R knee with normal inspection.  ACL MCL LCL feel solid.  Not ttp medial and lateral joint line.  Patella not ttp.   R quad ligament and tendon ttp under tension but not with relaxation.  No bruising no erythema.   Prev neg knee images d/w pt.  No sig bony changes noted.   Prev hip films d/w pt, with degenerative changes noted at the hips.

## 2024-01-19 ENCOUNTER — Ambulatory Visit: Payer: BC Managed Care – PPO | Admitting: Family Medicine

## 2024-01-26 ENCOUNTER — Ambulatory Visit: Admitting: Family Medicine

## 2024-01-26 ENCOUNTER — Encounter: Payer: Self-pay | Admitting: Family Medicine

## 2024-01-26 VITALS — BP 160/90 | HR 82 | Temp 98.2°F | Ht 70.0 in | Wt 295.0 lb

## 2024-01-26 DIAGNOSIS — R29898 Other symptoms and signs involving the musculoskeletal system: Secondary | ICD-10-CM | POA: Diagnosis not present

## 2024-01-26 DIAGNOSIS — I10 Essential (primary) hypertension: Secondary | ICD-10-CM | POA: Diagnosis not present

## 2024-01-26 DIAGNOSIS — R972 Elevated prostate specific antigen [PSA]: Secondary | ICD-10-CM

## 2024-01-26 DIAGNOSIS — Z125 Encounter for screening for malignant neoplasm of prostate: Secondary | ICD-10-CM | POA: Diagnosis not present

## 2024-01-26 DIAGNOSIS — W540XXA Bitten by dog, initial encounter: Secondary | ICD-10-CM | POA: Diagnosis not present

## 2024-01-26 LAB — PSA: PSA: 3.79 ng/mL (ref 0.10–4.00)

## 2024-01-26 NOTE — Patient Instructions (Addendum)
 Go to the lab on the way out.   If you have mychart we'll likely use that to update you.    Take care.  Glad to see you. If your BP stays above 140/90, then try increasing losartan  up to 1.5-2 tabs per day.  Let me know if you need a new rx.   Let me know if icing/voltaren /using the cane doesn't help.   Restart using the strap when possible.

## 2024-01-26 NOTE — Progress Notes (Signed)
 Central upper incisor removed at dental clinic.  Discussed.  Recheck PSA pending.  Prev results d/w pt.  No burning with urination.  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  Stepped into a hole.  R leg pain.  Anterior R knee and diffuse hip pain.  Didn't fall.  He didn't roll his ankle.  Didn't hit his head.  Prev knee pain improved with knee strap and voltaren  gel, until this event.  No knee locking.    L hand dog bite last week, his dog, is up to date on rabies vaccine.  He was pulling the dog back after it ran out the door.  3 healing abrasions on the L thumb with normal ROM and no erythema.    Elevated blood pressure noted.  Discussed checking blood pressure at home.  Compliant with losartan .  Meds, vitals, and allergies reviewed.   ROS: Per HPI unless specifically indicated in ROS section   Nad Ncat, upper central incisor absent. Neck supple, no LA rrr R medial knee pain with hip flexion and hip int rotation.  Hip not ttp or sore with range of motion. Knee not ttp o/w on exam and ACL MCL LCL feel solid.   3 small superficial healing abrasions on the L thumb with normal ROM and no erythema.  Normal sensation and capillary refill.  No spreading erythema.

## 2024-01-27 ENCOUNTER — Ambulatory Visit: Payer: Self-pay | Admitting: Family Medicine

## 2024-01-27 DIAGNOSIS — W540XXA Bitten by dog, initial encounter: Secondary | ICD-10-CM | POA: Insufficient documentation

## 2024-01-27 NOTE — Assessment & Plan Note (Signed)
 If BP stays above 140/90, then try increasing losartan  up to 1.5-2 tabs per day.  Let me know if needing a new rx.

## 2024-01-27 NOTE — Assessment & Plan Note (Signed)
 Recheck PSA pending.  Prev results d/w pt.  No burning with urination.  See notes on labs.

## 2024-01-27 NOTE — Assessment & Plan Note (Signed)
 Appears to be healing normally, his tetanus is up-to-date, and the dog has been vaccinated and is being quarantined.  Routine cautions given to patient.

## 2024-01-27 NOTE — Assessment & Plan Note (Signed)
 I do not suspect a fracture.  Discussed options. He can let me know if icing/voltaren /using the cane doesn't help.   Restart using the jumpers knee strap when possible.

## 2024-04-21 ENCOUNTER — Ambulatory Visit: Payer: Self-pay

## 2024-04-21 NOTE — Telephone Encounter (Signed)
  FYI Only or Action Required?: FYI only for provider.  Patient was last seen in primary care on 01/26/2024 by Alex Arlyss RAMAN, MD.  Called Nurse Triage reporting Back Pain.  Symptoms began yesterday.  Interventions attempted: OTC medications: Aleeve.  Symptoms are: unchanged.  Triage Disposition: See HCP Within 4 Hours (Or PCP Triage)  Patient/caregiver understands and will follow disposition?: Yes              Copied from CRM #[id number]. Topic: Clinical - Red Word Triage >> Apr 21, 2024  1:28 PM Deleta Oneal wrote: Red Word that prompted transfer to Nurse Triage: patient has back pain since last saturday difficult to walk and sleep Reason for Disposition  [1] Pain radiates into the thigh or further down the leg AND [2] both legs  Answer Assessment - Initial Assessment Questions 1. ONSET: When did the pain begin? (e.g., minutes, hours, days)     Saturday am 2. LOCATION: Where does it hurt? (upper, mid or lower back)     Lower back 3. SEVERITY: How bad is the pain?  (e.g., Scale 1-10; mild, moderate, or severe)     severe 4. PATTERN: Is the pain constant? (e.g., yes, no; constant, intermittent)      constant 5. RADIATION: Does the pain shoot into your legs or somewhere else?     Yes, legs 6. CAUSE:  What do you think is causing the back pain?      Moved to reach something and heard a pop 7. BACK OVERUSE:  Any recent lifting of heavy objects, strenuous work or exercise?     denies 8. MEDICINES: What have you taken so far for the pain? (e.g., nothing, acetaminophen , NSAIDS)     aleeve 9. NEUROLOGIC SYMPTOMS: Do you have any weakness, numbness, or problems with bowel/bladder control?     denies 10. OTHER SYMPTOMS: Do you have any other symptoms? (e.g., fever, abdomen pain, burning with urination, blood in urine)       no 11. PREGNANCY: Is there any chance you are pregnant? When was your last menstrual period?       na  Protocols used: Back  Pain-A-AH

## 2024-04-21 NOTE — Telephone Encounter (Signed)
 Did he prefer to see PCP on 04/26/24 or is he open to seeing one of our colleagues sooner?  It looks like we have some openings tomorrow.

## 2024-04-22 NOTE — Telephone Encounter (Signed)
 Left message for patient to see if he wanted to be seen sooner.

## 2024-04-22 NOTE — Telephone Encounter (Signed)
 Spoke to pt, pt requested to keep appt on Tues, 8/19

## 2024-04-25 NOTE — Telephone Encounter (Signed)
 I have been out of the office unexpectedly and I plan to return to clinic Tuesday.  I am reponding to messages as quickily as I can in the menatime.    I should be able to see him tomorrow. Thanks.

## 2024-04-26 ENCOUNTER — Ambulatory Visit: Admitting: Family Medicine

## 2024-04-26 VITALS — BP 144/92 | HR 78 | Temp 98.1°F | Ht 70.0 in | Wt 293.8 lb

## 2024-04-26 DIAGNOSIS — M546 Pain in thoracic spine: Secondary | ICD-10-CM | POA: Diagnosis not present

## 2024-04-26 MED ORDER — ROPINIROLE HCL 0.25 MG PO TABS: 0.2500 mg | ORAL_TABLET | Freq: Every evening | ORAL | Status: DC | PRN

## 2024-04-26 MED ORDER — PREDNISONE 10 MG PO TABS: ORAL_TABLET | ORAL | 0 refills | Status: AC

## 2024-04-26 NOTE — Patient Instructions (Addendum)
 Take prednisone  2 a day for 5 days, then 1 a day for 5 days, with food. Don't take with aleve/ibuprofen . Okay to taper quicker.    Use the back brace, gently stretch, and use heat/ice.  Take care.  Glad to see you.

## 2024-04-26 NOTE — Progress Notes (Unsigned)
 Back pain.  Front deck at his house needed to be replaced.  He was nearly done, reaching for a tool and felt something in his back.  This was about 10 days ago.  Using a back brace.  Tried alternating heat and ice.  Then tried aleve.  Pain prev down both legs, that resolved.  Midline and B paraspinal pain in the lower back pain.  No shoulder pain.  Pain walking, ie back pain now but not leg pain.    L upper central incisor absent, had extraction per dental clinic.  Had to have a bone graft in preparation for implant.    Prev xray with mild multilevel degenerative change.   Meds, vitals, and allergies reviewed.   ROS: Per HPI unless specifically indicated in ROS section   R hip pain at baseline with pain on int rotation but B SLR neg.   Back sore but not ttp Able to twice w/o inc in pain.

## 2024-04-27 NOTE — Assessment & Plan Note (Signed)
 Discussed options.  Okay to defer imaging at this point.  Steroid cautions discussed with patient. Take prednisone  2 a day for 5 days, then 1 a day for 5 days, with food. Don't take with aleve/ibuprofen . Okay to taper quicker, if needed.  Use his back brace, gently stretch, and use heat/ice.  Update me as needed.  He agrees.

## 2024-06-13 ENCOUNTER — Telehealth: Payer: Self-pay | Admitting: Family Medicine

## 2024-06-13 NOTE — Telephone Encounter (Signed)
 Patient notified. He is aware that if he has any problems to please let us  know.

## 2024-06-13 NOTE — Telephone Encounter (Signed)
 I don't think he will needs another referral.  Please have him call ortho.  If he can't get scheduled, then please let me know.

## 2024-06-13 NOTE — Telephone Encounter (Signed)
 Copied from CRM [id number]. Topic: Referral - Question >> Jun 13, 2024  3:11 PM Dedra B wrote: Reason for CRM: Pt was seen at [facility] in January for his hip and wants to know if he will need another referral to be seen again. Pls call pt.

## 2024-07-11 ENCOUNTER — Encounter: Payer: Self-pay | Admitting: Radiology

## 2024-07-14 ENCOUNTER — Encounter: Payer: Self-pay | Admitting: Sports Medicine

## 2024-07-14 ENCOUNTER — Other Ambulatory Visit: Payer: Self-pay

## 2024-07-14 ENCOUNTER — Ambulatory Visit: Admitting: Sports Medicine

## 2024-07-14 DIAGNOSIS — M1611 Unilateral primary osteoarthritis, right hip: Secondary | ICD-10-CM | POA: Diagnosis not present

## 2024-07-14 DIAGNOSIS — M79604 Pain in right leg: Secondary | ICD-10-CM

## 2024-07-14 NOTE — Progress Notes (Signed)
 Alex Oneal - 58 y.o. male MRN [id number]  Date of birth: 1966/03/26  Office Visit Note: Visit Date: 07/14/2024 PCP: Cleatus Arlyss RAMAN, MD Referred by: Cleatus Arlyss RAMAN, MD  Subjective: Chief Complaint  Patient presents with   Right Hip - Pain   HPI: Alex Oneal is a pleasant 58 y.o. male who presents today for chronic right hip pain with advanced osteoarthritis.  He has had right hip/groin as well as knee pain over the last year.  Initially evaluated for this back in January of this year.  He had x-rays of both the hip and the knee from his PCP who thought this was more hip related.  He also had a patellar tendon strap that he wears for work.  Pain and limitation is localized to the posterior and anterior hip.  He uses Aleve as needed.  At times his pain will radiate down to the knee.  No numbness or tingling.  Lab Results  Component Value Date   HGBA1C 6.2 10/13/2023   Pertinent ROS were reviewed with the patient and found to be negative unless otherwise specified above in HPI.   Assessment & Plan: Visit Diagnoses:  1. Unilateral primary osteoarthritis, right hip   2. Right leg pain    Plan: Impression is acute on chronic right hip pain which is severe based on radiographs as well as significantly limited range of motion on physical exam.  At times he has had pain within the right knee as well, which could be more referred pain from his hip limitation.  Through shared decision making, we did proceed with ultrasound-guided right hip injection, patient tolerated well.  He will see over the next few weeks to what degree this relieves his hip pain and if in addition this also alleviates his radiating pain to the knee.  Okay for Tylenol , ibuprofen /Aleve and or ice as needed for pain.  If he receives excellent relief of his pain for the hip but is only short-lived, he could be a candidate for THA.  He will see me back as needed.  Follow-up: Return if symptoms worsen or fail to improve.    Meds & Orders: No orders of the defined types were placed in this encounter.   Orders Placed This Encounter  Procedures   Large Joint Inj: R hip joint   US  Guided Needle Placement - No Linked Charges     Procedures: Large Joint Inj: R hip joint on 07/14/2024 1:45 PM Indications: pain Details: 22 G 3.5 in needle, ultrasound-guided anterior approach Medications: 4 mL lidocaine  1 %; 80 mg methylPREDNISolone acetate 40 MG/ML Outcome: tolerated well, no immediate complications  Procedure: US -guided intra-articular hip injection, Right After discussion on risks/benefits/indications and informed verbal consent was obtained, a timeout was performed. Patient was lying supine on exam table. The hip was cleaned with betadine and alcohol swabs. Then utilizing ultrasound guidance, the patient's femoral head and neck junction was identified and subsequently injected with 4:2 lidocaine :depomedrol via an in-plane approach with ultrasound visualization of the injectate administered into the hip joint. Patient tolerated procedure well without immediate complications.  Procedure, treatment alternatives, risks and benefits explained, specific risks discussed. Consent was given by the patient. Immediately prior to procedure a time out was called to verify the correct patient, procedure, equipment, support staff and site/side marked as required. Patient was prepped and draped in the usual sterile fashion.          Clinical History: No specialty comments available.  He reports  that he quit smoking about 19 years ago. His smoking use included cigarettes. He started smoking about 44 years ago. He has a 50 pack-year smoking history. He has never used smokeless tobacco.  Recent Labs    10/13/23 0837  HGBA1C 6.2    Objective:    Physical Exam  Gen: Well-appearing, in no acute distress; non-toxic CV: Well-perfused. Warm.  Resp: Breathing unlabored on room air; no wheezing. Psych: Fluid speech in  conversation; appropriate affect; normal thought process  Ortho Exam - Right hip: No redness swelling or effusion.  There is significant limitation in range of motion with internal rotation of only about 5 degrees. + FADIR test, equivocal Stinchfield testing.  There is limitation in pain with flexion about the hip.  Imaging:  *Independent review and interpretation of right hip x-ray from 09/28/2023 was reviewed by myself today which shows severe osteoarthritic change with bony sclerosis over the femoral head and the acetabular rim.  No acute fracture noted.  Narrative & Impression  CLINICAL DATA:  Worsening right hip pain   EXAM: DG HIP (WITH OR WITHOUT PELVIS) 2-3V RIGHT   COMPARISON:  03/10/2023 04/23/2022   FINDINGS: Moderately severe right hip degenerative arthropathy with joint space loss, sclerosis and bony spurring. No acute osseous finding or fracture. Normal alignment. Bony pelvis intact. Left hip unremarkable. SI joints are maintained. No diastasis   Lower abdominal subcentimeter calcifications bilaterally compatible with known intrarenal nephrolithiasis within the horseshoe kidney by comparison CT from 04/23/2022.   IMPRESSION: 1. Moderately severe right hip degenerative arthropathy. 2. No acute finding by plain radiography.     Electronically Signed   By: CHRISTELLA.  Shick M.D.   On: 09/28/2023 08:48    Past Medical/Family/Surgical/Social History: Medications & Allergies reviewed per EMR, new medications updated. Patient Active Problem List   Diagnosis Date Noted   Dog bite 01/27/2024   Knee pain 11/30/2023   PSA elevation 10/21/2023   Right knee pain 09/28/2023   Right groin pain 09/28/2023   Unilateral primary osteoarthritis, right hip 09/28/2023   Skin tear of hand without complication 07/01/2023   Erectile dysfunction 03/11/2023   Scrotal irritation 03/11/2023   Paronychia of left thumb 02/14/2022   Hyperglycemia 02/14/2022   Memory change 02/14/2022   Lip  lesion 10/06/2021   Achilles bursitis 08/04/2021   RLS (restless legs syndrome) 07/29/2020   Headache 01/03/2020   Horseshoe kidney with renal calculus    Tubulovillous adenoma 07/15/2019   Internal hemorrhoids 07/07/2019   PND (paroxysmal nocturnal dyspnea) 03/23/2019   OSA (obstructive sleep apnea) 03/23/2019   Pain in right hip 08/16/2018   Skin lesion 05/10/2018   Snoring 05/05/2018   Diverticulosis 01/08/2018   Functional belching disorder 01/08/2018   Osteoarthritis 01/08/2018   Bloating 11/12/2017   Atherosclerosis of aorta 06/14/2017   Pulmonary nodule 06/14/2017   FH: prostate cancer 04/22/2017   Shoulder pain 03/12/2017   Knee clicking 03/12/2017   Paresthesia 04/18/2016   Pain of lower extremity 01/25/2016   Routine general medical examination at a health care facility 04/11/2015   Advance care planning 04/11/2015   Back pain 03/02/2015   Abnormal biliary HIDA scan 10/24/2014   GERD (gastroesophageal reflux disease) 03/21/2014   Family history of colon cancer in mother 03/21/2014   Seasonal allergic rhinitis 12/14/2013   Asthma, mild persistent 09/23/2011   Obesity 09/19/2011   Hypertension 09/19/2011   Past Medical History:  Diagnosis Date   Abnormal biliary HIDA scan    Arthritis    Asthma  exercise induced   Former smoker    QUIT 02/2005   Glucose intolerance (impaired glucose tolerance)    Gynecomastia, male    Horseshoe kidney    Hypertension    Obesity    Shortness of breath dyspnea    with activfity   Sleep apnea    Family History  Problem Relation Age of Onset   Arthritis Mother    Heart disease Mother 75   Cancer Mother        colon, breast, ovarian   Allergies Mother    Kidney failure Mother    Kidney disease Mother        died of renal failure   Colon cancer Mother    Arthritis Father    Hypertension Father    Allergies Father    Asthma Father    Parkinsonism Father        possible environmental causes   Allergies Brother     Arthritis Paternal Aunt    Cancer Paternal Aunt    Arthritis Paternal Uncle    Cancer Paternal Uncle    Kidney failure Maternal Grandfather    Prostate cancer Maternal Grandfather    Arthritis Paternal Grandmother    Arthritis Paternal Grandfather    Cancer Paternal Grandfather    Stroke Paternal Grandfather    Aneurysm Neg Hx    Rectal cancer Neg Hx    Stomach cancer Neg Hx    Esophageal cancer Neg Hx    Past Surgical History:  Procedure Laterality Date   COLONOSCOPY     COLONOSCOPY W/ POLYPECTOMY     INSERTION OF MESH N/A 10/18/2015   Procedure: INSERTION OF MESH;  Surgeon: Debby Shipper, MD;  Location: [facility] OR;  Service: General;  Laterality: N/A;   TONSILLECTOMY     VENTRAL HERNIA REPAIR N/A 10/18/2015   Procedure: HERNIA REPAIR VENTRAL ADULT;  Surgeon: Debby Shipper, MD;  Location: [facility] OR;  Service: General;  Laterality: N/A;   Social History   Occupational History   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

## 2024-07-14 NOTE — Progress Notes (Signed)
 Patient says that he has had right hip and right knee pain throughout the year. He has had more bothersome pain in the anterior knee than he has the hip; he says that his PCP advised that his x-rays for the knee were normal, and he had arthritis in the hip which could be contributing to the knee pain. They did provide him with a patellar tendon strap which he wears at work. His hip pain is primarily in the posterior hip; he denies having pain in the side of the hip, or the anterior hip/groin. He takes Aleve only as needed.

## 2024-07-16 ENCOUNTER — Encounter: Payer: Self-pay | Admitting: Sports Medicine

## 2024-07-16 MED ORDER — LIDOCAINE HCL 1 % IJ SOLN
4.0000 mL | INTRAMUSCULAR | Status: AC | PRN
  Administered 2024-07-14: 4 mL

## 2024-07-16 MED ORDER — METHYLPREDNISOLONE ACETATE 40 MG/ML IJ SUSP
80.0000 mg | INTRAMUSCULAR | Status: AC | PRN
  Administered 2024-07-14: 80 mg via INTRA_ARTICULAR

## 2024-08-04 ENCOUNTER — Other Ambulatory Visit: Payer: Self-pay | Admitting: Family Medicine

## 2024-08-04 DIAGNOSIS — J4530 Mild persistent asthma, uncomplicated: Secondary | ICD-10-CM

## 2024-08-19 ENCOUNTER — Telehealth: Payer: Self-pay

## 2024-08-19 NOTE — Telephone Encounter (Signed)
 Copied from CRM #[id number]. Topic: Clinical - Medical Advice >> Aug 19, 2024  1:11 PM Alexandria E wrote: Reason for CRM: FYI - Patient called in to schedule his physical, got this schedule for patient. Upon further discussion, patient stated that he is still having right hip pain with little improvement. Patient did not see the need to speak with nurse triage and stated he is too busy to focus on coming in for a separate visit for that issue. Patient stated he will follow up with his orthopedic doctor come the new year to address his hip. Agent just wanted to make clinic aware.

## 2024-08-21 NOTE — Telephone Encounter (Signed)
 Noted. Thanks.

## 2024-08-22 ENCOUNTER — Telehealth: Payer: Self-pay

## 2024-08-22 DIAGNOSIS — Z125 Encounter for screening for malignant neoplasm of prostate: Secondary | ICD-10-CM

## 2024-08-22 DIAGNOSIS — R739 Hyperglycemia, unspecified: Secondary | ICD-10-CM

## 2024-08-22 DIAGNOSIS — I10 Essential (primary) hypertension: Secondary | ICD-10-CM

## 2024-08-22 NOTE — Telephone Encounter (Signed)
 Please call to set up lab

## 2024-08-22 NOTE — Telephone Encounter (Signed)
 Copied from CRM [id number]. Topic: Clinical - Lab/Test Results >> Aug 22, 2024 12:00 PM Antony RAMAN wrote: Reason for CRM: needs lab orders before his physical Please let him know when thy are in so he can schedule

## 2024-08-23 NOTE — Addendum Note (Signed)
 Addended by: CLEATUS ARLYSS RAMAN on: 08/23/2024 06:55 AM   Modules accepted: Orders

## 2024-08-23 NOTE — Telephone Encounter (Signed)
 Ordered. Thanks

## 2024-08-24 NOTE — Telephone Encounter (Signed)
 Called and schedule pt for  labs

## 2024-09-06 ENCOUNTER — Other Ambulatory Visit: Payer: Self-pay | Admitting: Family Medicine

## 2024-09-06 NOTE — Telephone Encounter (Signed)
 Copied from CRM [id number]. Topic: Clinical - Medication Refill >> Sep 06, 2024  4:19 PM Sophia H wrote: Medication: rOPINIRole  (REQUIP ) 0.25 MG tablet   Has the patient contacted their pharmacy? Yes, new RX to pharmacy   This is the patient's preferred pharmacy:    [id number] [place], [place] - [address] [place] [address] Phone: [phone] Fax: [phone]  Is this the correct pharmacy for this prescription? Yes If no, delete pharmacy and type the correct one.   Has the prescription been filled recently? Yes   Is the patient out of the medication? Yes  Has the patient been seen for an appointment in the last year OR does the patient have an upcoming appointment? Yes, seen in August, next visit in Feb. Patient just finished original RX  Can we respond through MyChart? Yes  Agent: Please be advised that Rx refills may take up to 3 business days. We ask that you follow-up with your pharmacy.

## 2024-09-07 NOTE — Telephone Encounter (Signed)
 Please advise listed as historical provider. Is this something you give?

## 2024-09-11 MED ORDER — ROPINIROLE HCL 0.25 MG PO TABS: 0.2500 mg | ORAL_TABLET | Freq: Every evening | ORAL | 2 refills | Status: AC | PRN

## 2024-10-09 ENCOUNTER — Other Ambulatory Visit: Payer: Self-pay | Admitting: Family Medicine

## 2024-10-09 DIAGNOSIS — J4530 Mild persistent asthma, uncomplicated: Secondary | ICD-10-CM

## 2024-10-18 ENCOUNTER — Other Ambulatory Visit

## 2024-10-25 ENCOUNTER — Encounter: Admitting: Family Medicine
